# Patient Record
Sex: Male | Born: 1945 | Race: White | Hispanic: No | Marital: Married | Smoking: Never smoker
Health system: Southern US, Community
[De-identification: ages and names within clinical notes are randomized; demographics above are authoritative.]

## PROBLEM LIST (undated history)

## (undated) DIAGNOSIS — I469 Cardiac arrest, cause unspecified: Secondary | ICD-10-CM

## (undated) DIAGNOSIS — N400 Enlarged prostate without lower urinary tract symptoms: Secondary | ICD-10-CM

## (undated) DIAGNOSIS — N200 Calculus of kidney: Secondary | ICD-10-CM

## (undated) DIAGNOSIS — F329 Major depressive disorder, single episode, unspecified: Secondary | ICD-10-CM

## (undated) DIAGNOSIS — I619 Nontraumatic intracerebral hemorrhage, unspecified: Secondary | ICD-10-CM

## (undated) DIAGNOSIS — M170 Bilateral primary osteoarthritis of knee: Secondary | ICD-10-CM

## (undated) DIAGNOSIS — F32A Depression, unspecified: Secondary | ICD-10-CM

## (undated) DIAGNOSIS — I639 Cerebral infarction, unspecified: Secondary | ICD-10-CM

## (undated) HISTORY — DX: Cardiac arrest, cause unspecified: I46.9

## (undated) HISTORY — DX: Benign prostatic hyperplasia without lower urinary tract symptoms: N40.0

## (undated) HISTORY — DX: Nontraumatic intracerebral hemorrhage, unspecified: I61.9

## (undated) HISTORY — DX: Major depressive disorder, single episode, unspecified: F32.9

## (undated) HISTORY — DX: Bilateral primary osteoarthritis of knee: M17.0

## (undated) HISTORY — DX: Calculus of kidney: N20.0

## (undated) HISTORY — DX: Depression, unspecified: F32.A

## (undated) HISTORY — PX: CARDIAC CATHETERIZATION: SHX172

---

## 1999-06-18 DIAGNOSIS — I469 Cardiac arrest, cause unspecified: Secondary | ICD-10-CM

## 1999-06-18 HISTORY — DX: Cardiac arrest, cause unspecified: I46.9

## 2005-11-06 ENCOUNTER — Other Ambulatory Visit: Payer: Self-pay

## 2005-11-06 ENCOUNTER — Observation Stay: Payer: Self-pay | Admitting: Internal Medicine

## 2007-06-18 HISTORY — PX: ROTATOR CUFF REPAIR: SHX139

## 2007-06-23 ENCOUNTER — Inpatient Hospital Stay: Payer: Self-pay | Admitting: Internal Medicine

## 2007-06-23 ENCOUNTER — Other Ambulatory Visit: Payer: Self-pay

## 2007-08-10 ENCOUNTER — Ambulatory Visit: Payer: Self-pay | Admitting: Specialist

## 2007-08-17 ENCOUNTER — Ambulatory Visit: Payer: Self-pay | Admitting: Specialist

## 2007-08-28 ENCOUNTER — Ambulatory Visit: Payer: Self-pay | Admitting: Specialist

## 2008-03-23 ENCOUNTER — Ambulatory Visit: Payer: Self-pay | Admitting: Specialist

## 2008-03-30 ENCOUNTER — Ambulatory Visit: Payer: Self-pay | Admitting: Specialist

## 2008-04-28 ENCOUNTER — Ambulatory Visit: Payer: Self-pay | Admitting: Specialist

## 2009-04-01 ENCOUNTER — Emergency Department: Payer: Self-pay | Admitting: Emergency Medicine

## 2009-04-10 ENCOUNTER — Emergency Department: Payer: Self-pay | Admitting: Emergency Medicine

## 2010-06-17 HISTORY — PX: HERNIA REPAIR: SHX51

## 2010-12-25 ENCOUNTER — Ambulatory Visit: Payer: Self-pay | Admitting: General Surgery

## 2011-02-08 ENCOUNTER — Ambulatory Visit: Payer: Self-pay | Admitting: General Surgery

## 2011-07-09 HISTORY — PX: CARDIOVASCULAR STRESS TEST: SHX262

## 2011-07-19 LAB — HM COLONOSCOPY

## 2012-02-05 ENCOUNTER — Encounter: Payer: Self-pay | Admitting: Internal Medicine

## 2012-02-05 ENCOUNTER — Ambulatory Visit (INDEPENDENT_AMBULATORY_CARE_PROVIDER_SITE_OTHER): Payer: Medicare PPO | Admitting: Internal Medicine

## 2012-02-05 VITALS — BP 112/68 | HR 66 | Temp 98.4°F | Ht 69.0 in | Wt 201.0 lb

## 2012-02-05 DIAGNOSIS — Z8674 Personal history of sudden cardiac arrest: Secondary | ICD-10-CM

## 2012-02-05 DIAGNOSIS — M170 Bilateral primary osteoarthritis of knee: Secondary | ICD-10-CM

## 2012-02-05 DIAGNOSIS — F331 Major depressive disorder, recurrent, moderate: Secondary | ICD-10-CM | POA: Insufficient documentation

## 2012-02-05 DIAGNOSIS — Z125 Encounter for screening for malignant neoplasm of prostate: Secondary | ICD-10-CM

## 2012-02-05 DIAGNOSIS — N400 Enlarged prostate without lower urinary tract symptoms: Secondary | ICD-10-CM

## 2012-02-05 DIAGNOSIS — M1710 Unilateral primary osteoarthritis, unspecified knee: Secondary | ICD-10-CM

## 2012-02-05 DIAGNOSIS — F329 Major depressive disorder, single episode, unspecified: Secondary | ICD-10-CM

## 2012-02-05 DIAGNOSIS — Z79899 Other long term (current) drug therapy: Secondary | ICD-10-CM

## 2012-02-05 DIAGNOSIS — I469 Cardiac arrest, cause unspecified: Secondary | ICD-10-CM | POA: Insufficient documentation

## 2012-02-05 LAB — BASIC METABOLIC PANEL
CO2: 29 mEq/L (ref 19–32)
Calcium: 9.5 mg/dL (ref 8.4–10.5)
GFR: 78.91 mL/min (ref >60.00)
Sodium: 139 mEq/L (ref 135–145)

## 2012-02-05 LAB — LIPID PANEL
HDL: 49.1 mg/dL (ref >39.00)
Total CHOL/HDL Ratio: 4
VLDL: 19.6 mg/dL (ref 0.0–40.0)

## 2012-02-05 LAB — CBC WITH DIFFERENTIAL/PLATELET
Basophils Relative: 0.5 % (ref 0.0–3.0)
Eosinophils Relative: 2.1 % (ref 0.0–5.0)
Hemoglobin: 14.5 g/dL (ref 13.0–17.0)
Lymphocytes Relative: 20 % (ref 12.0–46.0)
Monocytes Relative: 10.3 % (ref 3.0–12.0)
Neutro Abs: 3 10*3/uL (ref 1.4–7.7)
RBC: 4.82 Mil/uL (ref 4.22–5.81)

## 2012-02-05 LAB — HEPATIC FUNCTION PANEL
ALT: 25 U/L (ref 0–53)
Alkaline Phosphatase: 63 U/L (ref 39–117)
Bilirubin, Direct: 0.2 mg/dL (ref 0.0–0.3)
Total Protein: 7 g/dL (ref 6.0–8.3)

## 2012-02-05 NOTE — Progress Notes (Signed)
Subjective:    Patient ID: Keith Hall, male    DOB: [birth year], 66 y.o.   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    Allergies  Allergen Reactions  . Penicillins Swelling    Past Medical History  Diagnosis Date  . Osteoarthritis of both knees   . Depression 1980's06/20/2004    MDD---even needed hospitalization  . Kidney stone 1980 only    Past Surgical History  Procedure Date  . Cardiovascular stress test 07/09/11    Negative--Dr Paraschos  . Cardiac catheterization last in 12-05-2007    insignificant coronary disease  . Hernia repair 12-05-10    left inguinal--Dr Byrnett  . Rotator cuff repair 05-Dec-2007    both done by Dr Reita Chard    Family History  Problem Relation Age of Onset  . Stroke Mother   . COPD Sister   . Heart disease Neg Hx   . Hypertension Neg Hx   . Diabetes Son     Type 1    History   Social History  . Marital Status: Married    Spouse Name: N/A    Number of Children: 1  . Years of Education: N/A   Occupational History  . Outside sales of furniture     Retired at 62   Social History Main Topics  . Smoking status: Never Smoker   . Smokeless tobacco: Never Used  . Alcohol Use: No  . Drug Use: No  . Sexually Active: Not on file   Other Topics Concern  . Not on file   Social History Narrative   1 sonHas living willWife, then son, have health care POAWould accept resuscitation attempts but no prolonged artificial ventilationWould not want tube feeds if  cognitively unaware   Review of Systems  Constitutional: Negative for fatigue.       Has lost weight with fitness and proper eating ([employer]). Down 20# Wears seat belt Not regular with exercise  HENT: Positive for hearing loss. Negative for dental problem and tinnitus.        Regular with dentist  Eyes: Negative for visual disturbance.       Early cataract on left  Respiratory: Negative for cough, chest tightness and shortness of breath.   Cardiovascular: Negative for chest pain, palpitations and leg swelling.  Gastrointestinal: Negative for nausea, vomiting, abdominal pain, constipation and blood in stool.       No heartburn  Genitourinary: Positive for urgency. Negative for difficulty urinating.       Occ urgency No sexual problems  Musculoskeletal: Positive for arthralgias. Negative for back pain.  Skin: Negative for rash.       No suspicious lesions  Neurological: Negative for dizziness, syncope, light-headedness and headaches.  Hematological: Negative for adenopathy. Does not bruise/bleed easily.  Psychiatric/Behavioral: Positive for disturbed wake/sleep cycle. Negative for dysphoric mood. The patient is not nervous/anxious.        Sleep is interrupted Some daytime somnolence--never when driving  Objective:   Physical Exam  Constitutional: He is oriented to person, place, and time. He appears well-developed and well-nourished. No distress.  HENT:  Mouth/Throat: Oropharynx is clear and moist. No oropharyngeal exudate.  Eyes: Conjunctivae and EOM are normal. Pupils are equal, round, and reactive to light.  Neck: Normal range of motion. Neck supple. No thyromegaly present.  Cardiovascular: Normal rate, regular rhythm and intact distal pulses.  Exam reveals no gallop.   Murmur heard.      Gr 3/6 blowing systolic murmur in tricuspid area  Pulmonary/Chest: Effort normal and breath sounds normal. No respiratory distress. He has no wheezes. He has no rales.    Abdominal: Soft. There is no tenderness.  Musculoskeletal: He exhibits no edema and no tenderness.  Lymphadenopathy:    He has no cervical adenopathy.  Neurological: He is alert and oriented to person, place, and time.  Skin: No rash noted. No erythema.  Psychiatric: He has a normal mood and affect. His behavior is normal. Thought content normal.          Assessment & Plan:

## 2012-02-05 NOTE — Patient Instructions (Signed)
Please check to see if you have had a pneumonia vaccine (pneumovax)

## 2012-02-05 NOTE — Assessment & Plan Note (Signed)
Mild symptoms No meds indicated now

## 2012-02-05 NOTE — Assessment & Plan Note (Signed)
From work stress No problems now

## 2012-02-05 NOTE — Assessment & Plan Note (Signed)
Really a mystery No sig CAD No recurrences Does have murmur suggestive of tricuspid regurg No dyspnea to suggest pulmonary hypertension Would check echo if any symptoms

## 2012-02-05 NOTE — Assessment & Plan Note (Signed)
Discussed quad strengthening No major effusion or joint abnormalities on exam Will continue ibuprofen and glucosamine

## 2012-02-10 ENCOUNTER — Encounter: Payer: Self-pay | Admitting: *Deleted

## 2012-02-16 DIAGNOSIS — 419620001 Death: Secondary | SNOMED CT

## 2012-02-16 DEATH — deceased

## 2012-06-18 ENCOUNTER — Encounter: Payer: Self-pay | Admitting: Family Medicine

## 2012-06-18 ENCOUNTER — Ambulatory Visit (INDEPENDENT_AMBULATORY_CARE_PROVIDER_SITE_OTHER): Payer: Medicare Other | Admitting: Family Medicine

## 2012-06-18 VITALS — BP 120/76 | HR 79 | Temp 98.2°F | Ht 69.0 in | Wt 198.8 lb

## 2012-06-18 DIAGNOSIS — J019 Acute sinusitis, unspecified: Secondary | ICD-10-CM | POA: Insufficient documentation

## 2012-06-18 MED ORDER — AZITHROMYCIN 250 MG PO TABS: ORAL_TABLET | ORAL | Status: DC

## 2012-06-18 NOTE — Progress Notes (Signed)
  Subjective:    Patient ID: Merlene Morse, male    DOB: 1945/08/16, 67 y.o.   MRN: [id number]  HPI Comments: Has history of pleurisy.   Sinusitis This is a new problem. The current episode started 1 to 4 weeks ago (12/26). The problem has been gradually worsening since onset. Maximum temperature: subjective. Associated symptoms include chills, congestion, coughing and sinus pressure. Pertinent negatives include no ear pain, shortness of breath, sore throat or swollen glands. (Left frontal sinus pain) Past treatments include acetaminophen (Nyquil, dayquil). The treatment provided mild relief.      Review of Systems  Constitutional: Positive for chills.  HENT: Positive for congestion and sinus pressure. Negative for ear pain and sore throat.   Respiratory: Positive for cough. Negative for shortness of breath.        Objective:   Physical Exam  Constitutional: Vital signs are normal. He appears well-developed and well-nourished.  Non-toxic appearance. He does not appear ill. No distress.  HENT:  Head: Normocephalic and atraumatic.  Right Ear: Hearing, tympanic membrane, external ear and ear canal normal. No tenderness. No foreign bodies. Tympanic membrane is not retracted and not bulging.  Left Ear: Hearing, tympanic membrane, external ear and ear canal normal. No tenderness. No foreign bodies. Tympanic membrane is not retracted and not bulging.  Nose: Nose normal. No mucosal edema or rhinorrhea. Right sinus exhibits no maxillary sinus tenderness and no frontal sinus tenderness. Left sinus exhibits no maxillary sinus tenderness and no frontal sinus tenderness.  Mouth/Throat: Uvula is midline, oropharynx is clear and moist and mucous membranes are normal. Normal dentition. No dental caries. No oropharyngeal exudate or tonsillar abscesses.  Eyes: Conjunctivae normal, EOM and lids are normal. Pupils are equal, round, and reactive to light. No foreign bodies found.  Neck: Trachea normal,  normal range of motion and phonation normal. Neck supple. Carotid bruit is not present. No mass and no thyromegaly present.  Cardiovascular: Normal rate, regular rhythm, S1 normal, S2 normal, normal heart sounds, intact distal pulses and normal pulses.  Exam reveals no gallop.   No murmur heard. Pulmonary/Chest: Effort normal and breath sounds normal. No respiratory distress. He has no wheezes. He has no rhonchi. He has no rales.  Abdominal: Soft. Normal appearance and bowel sounds are normal. There is no hepatosplenomegaly. There is no tenderness. There is no rebound, no guarding and no CVA tenderness. No hernia.  Neurological: He is alert. He has normal reflexes.  Skin: Skin is warm, dry and intact. No rash noted.  Psychiatric: He has a normal mood and affect. His speech is normal and behavior is normal. Judgment normal.          Assessment & Plan:

## 2012-06-18 NOTE — Patient Instructions (Addendum)
Start mucinex twice daily  and nasal saline spray 2-3 times a day. Complete antibiotics. Call if not improving as expected.

## 2012-06-18 NOTE — Assessment & Plan Note (Signed)
Treat with Z-pack. Nasal saline irrigation, mucinex. Call if not improving as tolerated.

## 2012-07-06 ENCOUNTER — Ambulatory Visit: Payer: Self-pay | Admitting: General Practice

## 2012-07-06 LAB — URINALYSIS, COMPLETE
Bacteria: NONE SEEN
Bilirubin,UR: NEGATIVE
Blood: NEGATIVE
Ketone: NEGATIVE
Leukocyte Esterase: NEGATIVE
Nitrite: NEGATIVE
Ph: 5 (ref 4.5–8.0)
Protein: NEGATIVE
Specific Gravity: 1.009 (ref 1.003–1.030)
WBC UR: NONE SEEN /HPF (ref 0–5)

## 2012-07-06 LAB — SEDIMENTATION RATE: Erythrocyte Sed Rate: 4 mm/hr (ref 0–20)

## 2012-07-06 LAB — CBC
HCT: 41.4 % (ref 40.0–52.0)
MCH: 30.7 pg (ref 26.0–34.0)
MCV: 88 fL (ref 80–100)
Platelet: 133 10*3/uL — ABNORMAL LOW (ref 150–440)
RBC: 4.72 10*6/uL (ref 4.40–5.90)
RDW: 12.6 % (ref 11.5–14.5)

## 2012-07-06 LAB — MRSA PCR SCREENING

## 2012-07-06 LAB — BASIC METABOLIC PANEL
Anion Gap: 5 — ABNORMAL LOW (ref 7–16)
BUN: 13 mg/dL (ref 7–18)
EGFR (African American): >60
Glucose: 91 mg/dL (ref 65–99)
Potassium: 4.2 mmol/L (ref 3.5–5.1)

## 2012-07-06 LAB — PROTIME-INR: Prothrombin Time: 12.6 secs (ref 11.5–14.7)

## 2012-07-18 HISTORY — PX: TOTAL KNEE ARTHROPLASTY: SHX125

## 2012-08-03 ENCOUNTER — Inpatient Hospital Stay: Payer: Self-pay | Admitting: General Practice

## 2012-08-04 LAB — BASIC METABOLIC PANEL
Anion Gap: 6 — ABNORMAL LOW (ref 7–16)
Calcium, Total: 7.3 mg/dL — ABNORMAL LOW (ref 8.5–10.1)
Chloride: 106 mmol/L (ref 98–107)
Co2: 25 mmol/L (ref 21–32)
EGFR (Non-African Amer.): >60
Glucose: 108 mg/dL — ABNORMAL HIGH (ref 65–99)
Potassium: 4 mmol/L (ref 3.5–5.1)
Sodium: 137 mmol/L (ref 136–145)

## 2012-08-04 LAB — PLATELET COUNT: Platelet: 80 10*3/uL — ABNORMAL LOW (ref 150–440)

## 2012-08-04 LAB — HEMOGLOBIN: HGB: 12.6 g/dL — ABNORMAL LOW (ref 13.0–18.0)

## 2012-08-05 LAB — BASIC METABOLIC PANEL
Anion Gap: 6 — ABNORMAL LOW (ref 7–16)
Calcium, Total: 7.4 mg/dL — ABNORMAL LOW (ref 8.5–10.1)
Creatinine: 0.77 mg/dL (ref 0.60–1.30)
EGFR (African American): >60
EGFR (Non-African Amer.): >60
Glucose: 84 mg/dL (ref 65–99)
Osmolality: 275 (ref 275–301)
Potassium: 4.3 mmol/L (ref 3.5–5.1)
Sodium: 139 mmol/L (ref 136–145)

## 2012-08-05 LAB — HEMOGLOBIN: HGB: 12.9 g/dL — ABNORMAL LOW (ref 13.0–18.0)

## 2012-08-05 LAB — PLATELET COUNT: Platelet: 71 10*3/uL — ABNORMAL LOW (ref 150–440)

## 2012-08-06 LAB — PLATELET COUNT: Platelet: 75 10*3/uL — ABNORMAL LOW (ref 150–440)

## 2013-02-05 ENCOUNTER — Ambulatory Visit (INDEPENDENT_AMBULATORY_CARE_PROVIDER_SITE_OTHER): Payer: Self-pay | Admitting: Internal Medicine

## 2013-02-05 ENCOUNTER — Encounter: Payer: Self-pay | Admitting: Internal Medicine

## 2013-02-05 VITALS — BP 130/70 | HR 70 | Temp 98.0°F | Ht 69.0 in | Wt 209.0 lb

## 2013-02-05 DIAGNOSIS — M170 Bilateral primary osteoarthritis of knee: Secondary | ICD-10-CM

## 2013-02-05 DIAGNOSIS — R011 Cardiac murmur, unspecified: Secondary | ICD-10-CM

## 2013-02-05 DIAGNOSIS — N400 Enlarged prostate without lower urinary tract symptoms: Secondary | ICD-10-CM

## 2013-02-05 DIAGNOSIS — Z23 Encounter for immunization: Secondary | ICD-10-CM

## 2013-02-05 DIAGNOSIS — M1710 Unilateral primary osteoarthritis, unspecified knee: Secondary | ICD-10-CM

## 2013-02-05 DIAGNOSIS — IMO0001 Reserved for inherently not codable concepts without codable children: Secondary | ICD-10-CM | POA: Insufficient documentation

## 2013-02-05 DIAGNOSIS — Z0000 Encounter for general adult medical examination without abnormal findings: Secondary | ICD-10-CM | POA: Insufficient documentation

## 2013-02-05 DIAGNOSIS — I469 Cardiac arrest, cause unspecified: Secondary | ICD-10-CM

## 2013-02-05 DIAGNOSIS — R7989 Other specified abnormal findings of blood chemistry: Secondary | ICD-10-CM

## 2013-02-05 LAB — BASIC METABOLIC PANEL
BUN: 15 mg/dL (ref 6–23)
Creatinine, Ser: 1.1 mg/dL (ref 0.4–1.5)
GFR: 72.77 mL/min (ref >60.00)
Potassium: 3.8 mEq/L (ref 3.5–5.1)

## 2013-02-05 LAB — CBC WITH DIFFERENTIAL/PLATELET
Basophils Relative: 0.5 % (ref 0.0–3.0)
Eosinophils Relative: 3.7 % (ref 0.0–5.0)
Monocytes Relative: 10 % (ref 3.0–12.0)
Neutrophils Relative %: 70.7 % (ref 43.0–77.0)
Platelets: 139 10*3/uL — ABNORMAL LOW (ref 150.0–400.0)
RBC: 4.66 Mil/uL (ref 4.22–5.81)
WBC: 4.8 10*3/uL (ref 4.5–10.5)

## 2013-02-05 LAB — LIPID PANEL
Cholesterol: 202 mg/dL — ABNORMAL HIGH (ref 0–200)
HDL: 48.3 mg/dL (ref >39.00)
Total CHOL/HDL Ratio: 4
VLDL: 26.4 mg/dL (ref 0.0–40.0)

## 2013-02-05 LAB — HEPATIC FUNCTION PANEL
AST: 23 U/L (ref 0–37)
Alkaline Phosphatase: 57 U/L (ref 39–117)
Bilirubin, Direct: 0.1 mg/dL (ref 0.0–0.3)
Total Bilirubin: 0.8 mg/dL (ref 0.3–1.2)

## 2013-02-05 LAB — TSH: TSH: 1.48 u[IU]/mL (ref 0.35–5.50)

## 2013-02-05 NOTE — Assessment & Plan Note (Signed)
Mild symptoms at most

## 2013-02-05 NOTE — Assessment & Plan Note (Signed)
Generally healthy Will give pneumovax Defer PSA---consider testing next year for last time

## 2013-02-05 NOTE — Assessment & Plan Note (Signed)
Stable prominent murmur in tricuspid region---could be MR also Will leave to Dr Darrold Junker to do echo to evaluate

## 2013-02-05 NOTE — Assessment & Plan Note (Signed)
Doing better after right TKR

## 2013-02-05 NOTE — Progress Notes (Signed)
Subjective:    Patient ID: Keith Hall, male    DOB: 04-30-1946, 67 y.o.   MRN: [id number]  HPI Here for physical  Had right TKR by Dr Ernest Pine in February Did very well with that  Still sees Dr Jason Nest him before surgery No further testing  Current Outpatient Prescriptions on File Prior to Visit  Medication Sig Dispense Refill  . aspirin 81 MG tablet Take 162 mg by mouth daily.      . Glucosamine-Chondroitin (MOVE FREE PO) Take 2 tablets by mouth daily.      . Ibuprofen (ADVIL) 200 MG CAPS Take 2 capsules by mouth 2 (two) times daily.      . Multiple Vitamins-Minerals (ONE-A-DAY 50 PLUS) TABS Take 2 tablets by mouth daily.       No current facility-administered medications on file prior to visit.    Allergies  Allergen Reactions  . Penicillins Swelling    Past Medical History  Diagnosis Date  . Osteoarthritis of both knees   . Depression 1980's06/03/04    MDD---even needed hospitalization  . Kidney stone 1980 only  . Sudden cardiac death Nov 18, 1999    no clear etiology of this  . BPH (benign prostatic hypertrophy)     Past Surgical History  Procedure Laterality Date  . Cardiovascular stress test  07/09/11    Negative--Dr Paraschos  . Cardiac catheterization  last in 11/18/2007    insignificant coronary disease  . Hernia repair  November 18, 2010    left inguinal--Dr Byrnett  . Rotator cuff repair  11/18/2007    both done by Dr Reita Chard  . Total knee arthroplasty Right 2/14    Dr Ernest Pine    Family History  Problem Relation Age of Onset  . Stroke Mother   . COPD Sister   . Heart disease Neg Hx   . Hypertension Neg Hx   . Diabetes Son     Type 1    History   Social History  . Marital Status: Married    Spouse Name: N/A    Number of Children: 1  . Years of Education: N/A   Occupational History  . Outside sales of furniture     Retired at 57   Social History Main Topics  . Smoking status: Never Smoker   . Smokeless tobacco: Never Used  . Alcohol Use: No  . Drug  Use: No  . Sexual Activity: Not on file   Other Topics Concern  . Not on file   Social History Narrative   1 son   Has living will   Wife, then son, have health care POA   Would accept resuscitation attempts but no prolonged artificial ventilation   Would not want tube feeds if cognitively unaware   Review of Systems  Constitutional: Negative for fatigue and unexpected weight change.       [place] exercise [place] [employer] Wears seat belt  HENT: Positive for hearing loss. Negative for congestion, rhinorrhea, dental problem and tinnitus.        Regular with dentist  Eyes: Positive for visual disturbance.       Early cataract on left  Respiratory: Negative for cough, chest tightness and shortness of breath.   Cardiovascular: Negative for chest pain, palpitations and leg swelling.  Gastrointestinal: Negative for nausea, vomiting, abdominal pain, constipation and blood in stool.       No heartburn  Endocrine: Negative for cold intolerance and heat intolerance.  Genitourinary: Positive for urgency. Negative for frequency  and difficulty urinating.       Rare urgency  No sexual lesions  Musculoskeletal: Positive for arthralgias. Negative for back pain and joint swelling.  Skin: Negative for rash.       Plans for derm check up in October  Allergic/Immunologic: Negative for environmental allergies and immunocompromised state.  Neurological: Negative for dizziness, syncope, weakness, light-headedness, numbness and headaches.  Hematological: Negative for adenopathy. Does not bruise/bleed easily.  Psychiatric/Behavioral: Negative for sleep disturbance and dysphoric mood. The patient is not nervous/anxious.        Generally sleeps well       Objective:   Physical Exam  Constitutional: He is oriented to person, place, and time. He appears well-developed and well-nourished. No distress.  HENT:  Head: Normocephalic and atraumatic.  Right Ear: External ear normal.  Left Ear: External ear normal.   Mouth/Throat: Oropharynx is clear and moist. No oropharyngeal exudate.  Eyes: Conjunctivae and EOM are normal. Pupils are equal, round, and reactive to light.  Neck: Normal range of motion. Neck supple. No thyromegaly present.  Cardiovascular: Normal rate, regular rhythm and intact distal pulses.  Exam reveals no gallop.   Murmur heard. Gr 3/6 blowing systolic murmur at left sternal border, softer in mitral area  Pulmonary/Chest: Effort normal and breath sounds normal. No respiratory distress. He has no wheezes. He has no rales.  Abdominal: Soft. There is no tenderness.  Musculoskeletal: He exhibits no edema and no tenderness.  Lymphadenopathy:    He has no cervical adenopathy.  Neurological: He is alert and oriented to person, place, and time.  Skin: No rash noted. No erythema.  Psychiatric: He has a normal mood and affect. His behavior is normal.          Assessment & Plan:

## 2013-02-05 NOTE — Addendum Note (Signed)
Addended by: Sueanne Margarita on: 01/27/2013 01:02 PM   Modules accepted: Orders

## 2013-02-08 LAB — LDL CHOLESTEROL, DIRECT: Direct LDL: 138.2 mg/dL

## 2013-02-15 DIAGNOSIS — 419620001 Death: Secondary | SNOMED CT

## 2013-02-15 DEATH — deceased

## 2013-02-17 ENCOUNTER — Encounter: Payer: Self-pay | Admitting: Internal Medicine

## 2013-02-23 ENCOUNTER — Other Ambulatory Visit (INDEPENDENT_AMBULATORY_CARE_PROVIDER_SITE_OTHER): Payer: Medicare Other

## 2013-02-23 DIAGNOSIS — R7301 Impaired fasting glucose: Secondary | ICD-10-CM

## 2013-02-23 LAB — GLUCOSE, RANDOM: Glucose, Bld: 93 mg/dL (ref 70–99)

## 2013-02-23 LAB — HEMOGLOBIN A1C: Hgb A1c MFr Bld: 5.1 % (ref 4.6–6.5)

## 2013-04-22 ENCOUNTER — Other Ambulatory Visit: Payer: Self-pay

## 2013-04-29 ENCOUNTER — Ambulatory Visit: Payer: Self-pay | Admitting: General Practice

## 2013-04-29 LAB — URINALYSIS, COMPLETE
Bacteria: NONE SEEN
Bilirubin,UR: NEGATIVE
Nitrite: NEGATIVE
Ph: 6 (ref 4.5–8.0)
Protein: NEGATIVE
Specific Gravity: 1.013 (ref 1.003–1.030)
Squamous Epithelial: NONE SEEN
WBC UR: NONE SEEN /HPF (ref 0–5)

## 2013-04-29 LAB — SEDIMENTATION RATE: Erythrocyte Sed Rate: 3 mm/hr (ref 0–20)

## 2013-04-29 LAB — BASIC METABOLIC PANEL
Calcium, Total: 9 mg/dL (ref 8.5–10.1)
Chloride: 107 mmol/L (ref 98–107)
Creatinine: 0.87 mg/dL (ref 0.60–1.30)
EGFR (African American): >60
EGFR (Non-African Amer.): >60
Osmolality: 274 (ref 275–301)
Sodium: 137 mmol/L (ref 136–145)

## 2013-04-29 LAB — CBC
HCT: 42.9 % (ref 40.0–52.0)
HGB: 15 g/dL (ref 13.0–18.0)
MCH: 30.4 pg (ref 26.0–34.0)
MCV: 87 fL (ref 80–100)
RBC: 4.95 10*6/uL (ref 4.40–5.90)

## 2013-04-29 LAB — MRSA PCR SCREENING

## 2013-04-29 LAB — PROTIME-INR: Prothrombin Time: 12.9 secs (ref 11.5–14.7)

## 2013-04-30 LAB — URINE CULTURE

## 2013-05-05 ENCOUNTER — Encounter: Payer: Self-pay | Admitting: Internal Medicine

## 2013-05-05 ENCOUNTER — Ambulatory Visit (INDEPENDENT_AMBULATORY_CARE_PROVIDER_SITE_OTHER): Payer: Medicare Other | Admitting: Internal Medicine

## 2013-05-05 VITALS — BP 120/70 | HR 85 | Temp 98.0°F | Wt 208.0 lb

## 2013-05-05 DIAGNOSIS — J019 Acute sinusitis, unspecified: Secondary | ICD-10-CM | POA: Insufficient documentation

## 2013-05-05 MED ORDER — AZITHROMYCIN 250 MG PO TABS: ORAL_TABLET | ORAL | Status: DC

## 2013-05-05 NOTE — Progress Notes (Signed)
Subjective:    Patient ID: Keith Hall, male    DOB: 1946-04-16, 67 y.o.   MRN: [id number]  HPI Has a lot of terrible congestion Can't sleep lying down--- needs to sit up in recliner Feels that he has pleurisy-- "build up of lots of mucus" (not pleurisy) So much mucus--he has to vomit it up Pain over left eye---bad frontal pressure No sore throat No ear pain Cough--day and night Started a week ago and has built up No fever but slight chills. No sweats  Has knee surgery scheduled next week--TKR by Dr Rae Halsted mucinex--not much help, and robitussin--this helped cough  Current Outpatient Prescriptions on File Prior to Visit  Medication Sig Dispense Refill  . aspirin 81 MG tablet Take 162 mg by mouth daily.      . Glucosamine-Chondroitin (MOVE FREE PO) Take 2 tablets by mouth daily.      . Ibuprofen (ADVIL) 200 MG CAPS Take 2 capsules by mouth 2 (two) times daily.      . Multiple Vitamins-Minerals (ONE-A-DAY 50 PLUS) TABS Take 2 tablets by mouth daily.       No current facility-administered medications on file prior to visit.    Allergies  Allergen Reactions  . Penicillins Swelling    Past Medical History  Diagnosis Date  . Osteoarthritis of both knees   . Depression 1980's27-May-2004    MDD---even needed hospitalization  . Kidney stone 1980 only  . Sudden cardiac death 1999/11/11    no clear etiology of this  . BPH (benign prostatic hypertrophy)     Past Surgical History  Procedure Laterality Date  . Cardiovascular stress test  07/09/11    Negative--Dr Paraschos  . Cardiac catheterization  last in 2007-11-11    insignificant coronary disease  . Hernia repair  11/11/10    left inguinal--Dr Byrnett  . Rotator cuff repair  2007/11/11    both done by Dr Reita Chard  . Total knee arthroplasty Right 2/14    Dr Ernest Pine    Family History  Problem Relation Age of Onset  . Stroke Mother   . COPD Sister   . Heart disease Neg Hx   . Hypertension Neg Hx   . Diabetes Son     Type 1     History   Social History  . Marital Status: Married    Spouse Name: N/A    Number of Children: 1  . Years of Education: N/A   Occupational History  . Outside sales of furniture     Retired at 16   Social History Main Topics  . Smoking status: Never Smoker   . Smokeless tobacco: Never Used  . Alcohol Use: No  . Drug Use: No  . Sexual Activity: Not on file   Other Topics Concern  . Not on file   Social History Narrative   1 son   Has living will   Wife, then son, have health care POA   Would accept resuscitation attempts but no prolonged artificial ventilation   Would not want tube feeds if cognitively unaware   Review of Systems Started after doing yard work-- though he did wear a mask    Objective:   Physical Exam  Constitutional: He appears well-developed and well-nourished. No distress.  HENT:  Mouth/Throat: Oropharynx is clear and moist. No oropharyngeal exudate.  No sinus tenderness Moderate nasal congestion TMs negative   Neck: Normal range of motion. Neck supple. No thyromegaly present.  Pulmonary/Chest: Effort normal and  breath sounds normal. No respiratory distress. He has no wheezes. He has no rales.  Lymphadenopathy:    He has no cervical adenopathy.          Assessment & Plan:

## 2013-05-05 NOTE — Assessment & Plan Note (Signed)
Clearly seems to have secondary bacterial infection Discussed symptomatic Rx Will treat with z-pak due to pcn allergy Hopefully will still be able to have TKR on Monday

## 2013-05-05 NOTE — Progress Notes (Signed)
Pre-visit discussion using our clinic review tool. No additional management support is needed unless otherwise documented below in the visit note.  

## 2013-05-06 ENCOUNTER — Ambulatory Visit: Payer: Medicare Other | Admitting: Internal Medicine

## 2013-05-11 ENCOUNTER — Encounter: Payer: Self-pay | Admitting: Internal Medicine

## 2013-05-11 ENCOUNTER — Telehealth: Payer: Self-pay

## 2013-05-11 ENCOUNTER — Ambulatory Visit (INDEPENDENT_AMBULATORY_CARE_PROVIDER_SITE_OTHER): Payer: Medicare Other | Admitting: Internal Medicine

## 2013-05-11 VITALS — BP 128/70 | HR 73 | Temp 98.5°F | Resp 14 | Wt 205.0 lb

## 2013-05-11 DIAGNOSIS — J019 Acute sinusitis, unspecified: Secondary | ICD-10-CM

## 2013-05-11 MED ORDER — LEVOFLOXACIN 500 MG PO TABS: 500.0000 mg | ORAL_TABLET | Freq: Every day | ORAL | Status: DC

## 2013-05-11 NOTE — Progress Notes (Signed)
  Subjective:    Patient ID: Keith Hall, male    DOB: 1946/01/22, 67 y.o.   MRN: [id number]  HPI Here with wife Is some better but not over this Still with post nasal drip  No fever Has had some sweats but no chills or shakes Slight shortness of breath--mostly with the cough  Done with the z-pak  Current Outpatient Prescriptions on File Prior to Visit  Medication Sig Dispense Refill  . aspirin 81 MG tablet Take 162 mg by mouth daily.      . Glucosamine-Chondroitin (MOVE FREE PO) Take 2 tablets by mouth daily.      . Ibuprofen (ADVIL) 200 MG CAPS Take 2 capsules by mouth 2 (two) times daily.      . Multiple Vitamins-Minerals (ONE-A-DAY 50 PLUS) TABS Take 2 tablets by mouth daily.       No current facility-administered medications on file prior to visit.    Allergies  Allergen Reactions  . Penicillins Swelling    Past Medical History  Diagnosis Date  . Osteoarthritis of both knees   . Depression 1980'sJun 03, 2004    MDD---even needed hospitalization  . Kidney stone 1980 only  . Sudden cardiac death 11-18-1999    no clear etiology of this  . BPH (benign prostatic hypertrophy)     Past Surgical History  Procedure Laterality Date  . Cardiovascular stress test  07/09/11    Negative--Dr Paraschos  . Cardiac catheterization  last in 11/18/2007    insignificant coronary disease  . Hernia repair  Nov 18, 2010    left inguinal--Dr Byrnett  . Rotator cuff repair  11-18-07    both done by Dr Reita Chard  . Total knee arthroplasty Right 2/14    Dr Ernest Pine    Family History  Problem Relation Age of Onset  . Stroke Mother   . COPD Sister   . Heart disease Neg Hx   . Hypertension Neg Hx   . Diabetes Son     Type 1    History   Social History  . Marital Status: Married    Spouse Name: N/A    Number of Children: 1  . Years of Education: N/A   Occupational History  . Outside sales of furniture     Retired at 10   Social History Main Topics  . Smoking status: Never Smoker   . Smokeless  tobacco: Never Used  . Alcohol Use: No  . Drug Use: No  . Sexual Activity: Not on file   Other Topics Concern  . Not on file   Social History Narrative   1 son   Has living will   Wife, then son, have health care POA   Would accept resuscitation attempts but no prolonged artificial ventilation   Would not want tube feeds if cognitively unaware   Review of Systems No vomiting or diarrhea Appetite is okay    Objective:   Physical Exam  Constitutional: He appears well-developed and well-nourished. No distress.  HENT:  Mouth/Throat: Oropharynx is clear and moist. No oropharyngeal exudate.  No sinus tenderness  Mild sinus inflammation  Neck: Normal range of motion. Neck supple. No thyromegaly present.  Pulmonary/Chest: Effort normal and breath sounds normal. No respiratory distress. He has no wheezes. He has no rales.  Lymphadenopathy:    He has no cervical adenopathy.          Assessment & Plan:

## 2013-05-11 NOTE — Progress Notes (Signed)
Pre-visit discussion using our clinic review tool. No additional management support is needed unless otherwise documented below in the visit note.  

## 2013-05-11 NOTE — Assessment & Plan Note (Signed)
Better but ongoing symptoms No signs of lower respiratory involvement--still prudent to hold off on the surgery Will change to levaquin Hopefully can reschedule TKR soon

## 2013-05-11 NOTE — Telephone Encounter (Signed)
Pt request status of levaquin rx. Spoke with Lafonda Mosses at [facility] ready for pick up ; pt voiced understanding.

## 2013-05-21 ENCOUNTER — Ambulatory Visit: Payer: Medicare Other | Admitting: Internal Medicine

## 2013-06-11 ENCOUNTER — Telehealth: Payer: Self-pay

## 2013-06-11 MED ORDER — LEVOFLOXACIN 500 MG PO TABS: 500.0000 mg | ORAL_TABLET | Freq: Every day | ORAL | Status: DC

## 2013-06-11 NOTE — Telephone Encounter (Signed)
Mrs Kiehn said pt has head congestion,,non prod cough,and h/a. No fever, CP, SOB or dizziness. Similar symptoms when seen 04/2013 and pt completely was relieved from symptoms after taking Levaquin. Pt has been exposed to a lot of sick people over the holidays; pt is presently in [place] returning home late tonight and request refill of Levaquin to [facility].Please advise. Pt has rescheduled TKR the first of Jan.

## 2013-06-11 NOTE — Telephone Encounter (Signed)
Okay to renew levofloxacin 500mg  #10 x 0 1 daily  If he has fever or SOB, he needs to be seen I am mostly doing this to hopefully make sure he can get the surgery as planned (otherwise, I would require a visit)

## 2013-06-11 NOTE — Telephone Encounter (Signed)
rx sent to pharmacy by e-script Spoke with patient and advised results   

## 2013-06-17 HISTORY — PX: TOTAL KNEE ARTHROPLASTY: SHX125

## 2013-06-29 ENCOUNTER — Ambulatory Visit: Payer: Self-pay | Admitting: Anesthesiology

## 2013-06-29 LAB — CBC
HCT: 42.6 % (ref 40.0–52.0)
HGB: 14.9 g/dL (ref 13.0–18.0)
MCH: 30 pg (ref 26.0–34.0)
MCHC: 34.9 g/dL (ref 32.0–36.0)
MCV: 86 fL (ref 80–100)
PLATELETS: 147 10*3/uL — AB (ref 150–440)
RBC: 4.95 10*6/uL (ref 4.40–5.90)
RDW: 12.5 % (ref 11.5–14.5)
WBC: 5.4 10*3/uL (ref 3.8–10.6)

## 2013-07-05 ENCOUNTER — Inpatient Hospital Stay: Payer: Self-pay | Admitting: General Practice

## 2013-07-06 LAB — PLATELET COUNT: PLATELETS: 82 10*3/uL — AB (ref 150–440)

## 2013-07-06 LAB — BASIC METABOLIC PANEL
ANION GAP: 4 — AB (ref 7–16)
BUN: 14 mg/dL (ref 7–18)
Calcium, Total: 8.1 mg/dL — ABNORMAL LOW (ref 8.5–10.1)
Chloride: 103 mmol/L (ref 98–107)
Co2: 25 mmol/L (ref 21–32)
Creatinine: 0.9 mg/dL (ref 0.60–1.30)
EGFR (African American): >60
EGFR (Non-African Amer.): >60
Glucose: 87 mg/dL (ref 65–99)
OSMOLALITY: 264 (ref 275–301)
Potassium: 3.8 mmol/L (ref 3.5–5.1)
SODIUM: 132 mmol/L — AB (ref 136–145)

## 2013-07-06 LAB — HEMOGLOBIN: HGB: 12.2 g/dL — AB (ref 13.0–18.0)

## 2013-07-07 LAB — BASIC METABOLIC PANEL
Anion Gap: 8 (ref 7–16)
BUN: 10 mg/dL (ref 7–18)
CALCIUM: 7.8 mg/dL — AB (ref 8.5–10.1)
CO2: 24 mmol/L (ref 21–32)
Chloride: 104 mmol/L (ref 98–107)
Creatinine: 0.85 mg/dL (ref 0.60–1.30)
EGFR (Non-African Amer.): >60
Glucose: 100 mg/dL — ABNORMAL HIGH (ref 65–99)
Osmolality: 271 (ref 275–301)
Potassium: 3.8 mmol/L (ref 3.5–5.1)
Sodium: 136 mmol/L (ref 136–145)

## 2013-07-07 LAB — PLATELET COUNT: PLATELETS: 74 10*3/uL — AB (ref 150–440)

## 2013-07-07 LAB — HEMOGLOBIN: HGB: 12.5 g/dL — ABNORMAL LOW (ref 13.0–18.0)

## 2013-07-19 ENCOUNTER — Encounter: Payer: Self-pay | Admitting: Internal Medicine

## 2013-07-20 ENCOUNTER — Ambulatory Visit (INDEPENDENT_AMBULATORY_CARE_PROVIDER_SITE_OTHER): Payer: Medicare Other | Admitting: Family Medicine

## 2013-07-20 ENCOUNTER — Encounter: Payer: Self-pay | Admitting: Family Medicine

## 2013-07-20 VITALS — BP 122/76 | HR 86 | Temp 98.1°F | Wt 203.8 lb

## 2013-07-20 DIAGNOSIS — L03116 Cellulitis of left lower limb: Secondary | ICD-10-CM | POA: Insufficient documentation

## 2013-07-20 DIAGNOSIS — L02419 Cutaneous abscess of limb, unspecified: Secondary | ICD-10-CM

## 2013-07-20 DIAGNOSIS — R5381 Other malaise: Secondary | ICD-10-CM | POA: Insufficient documentation

## 2013-07-20 DIAGNOSIS — L03119 Cellulitis of unspecified part of limb: Secondary | ICD-10-CM

## 2013-07-20 DIAGNOSIS — R5383 Other fatigue: Principal | ICD-10-CM

## 2013-07-20 LAB — CBC WITH DIFFERENTIAL/PLATELET
BASOS ABS: 0 10*3/uL (ref 0.0–0.1)
Basophils Relative: 0.4 % (ref 0.0–3.0)
Eosinophils Absolute: 0.1 10*3/uL (ref 0.0–0.7)
Eosinophils Relative: 1.9 % (ref 0.0–5.0)
HCT: 38.7 % — ABNORMAL LOW (ref 39.0–52.0)
Hemoglobin: 12.7 g/dL — ABNORMAL LOW (ref 13.0–17.0)
Lymphs Abs: 0.5 10*3/uL — ABNORMAL LOW (ref 0.7–4.0)
MCHC: 33 g/dL (ref 30.0–36.0)
MCV: 91 fl (ref 78.0–100.0)
MONOS PCT: 7.7 % (ref 3.0–12.0)
Monocytes Absolute: 0.5 10*3/uL (ref 0.1–1.0)
Neutro Abs: 5.5 10*3/uL (ref 1.4–7.7)
Neutrophils Relative %: 82.5 % — ABNORMAL HIGH (ref 43.0–77.0)
PLATELETS: 311 10*3/uL (ref 150.0–400.0)
RBC: 4.25 Mil/uL (ref 4.22–5.81)
RDW: 13.4 % (ref 11.5–14.6)
WBC: 6.7 10*3/uL (ref 4.5–10.5)

## 2013-07-20 LAB — POCT URINALYSIS DIPSTICK
Bilirubin, UA: NEGATIVE
Glucose, UA: NEGATIVE
Ketones, UA: NEGATIVE
Leukocytes, UA: NEGATIVE
Nitrite, UA: NEGATIVE
Protein, UA: NEGATIVE
RBC UA: NEGATIVE
UROBILINOGEN UA: 0.2
pH, UA: 6

## 2013-07-20 LAB — BASIC METABOLIC PANEL
BUN: 19 mg/dL (ref 6–23)
CALCIUM: 9.3 mg/dL (ref 8.4–10.5)
CO2: 22 mEq/L (ref 19–32)
Chloride: 105 mEq/L (ref 96–112)
Creatinine, Ser: 1.1 mg/dL (ref 0.4–1.5)
GFR: 72.67 mL/min (ref >60.00)
GLUCOSE: 104 mg/dL — AB (ref 70–99)
Potassium: 4.3 mEq/L (ref 3.5–5.1)
Sodium: 135 mEq/L (ref 135–145)

## 2013-07-20 NOTE — Progress Notes (Signed)
Subjective:    Patient ID: Keith Hall, male    DOB: 03-14-1946, 68 y.o.   MRN: [id number]  HPI CC: ?UTI  Presents with relative June today.  68 yo patient of Dr. Alla German with h/o BPH and remote kidney stone x1 presents today endorsing cold feeling and tender testicles for last 3-4 days.  gen malaise with nausea.  He had foley catheter removed 2 weeks ago.  Denies fevers/chills, back or bottom pain.  Denies h/o prostatitis.  No new rashes.  No abd pain, vomiting, diarrhea.  No recent dysuria, urgency.  + frequency attributes to increased water intake.  Has lost appetite.  He brings records from [facility] after recent discharge for L TKR.  Latest Na 132, Ca 8.1, Hgb 12.2 o/w WNL.  Cr normal (1/20).  Saw ortho this morning, stitches removed, as well as had first PT session today.    He has been taking several new meds and overall doesn't feel well.  Increased nausea with recent meds. He is currently on lovenox and off aspirin for DVT ppx after knee replacement. He is also on hydrocodone and tramadol for pain relief. Last week started on celebrex 200mg  bid and bactrim DS bid for cellulitis of L leg.  This was when he started feeling ill.  Has taken advil well in past. States he had MI 15 yrs ago, but since has had normal catheterization 2007/09/21).  Prior only took MVI, aspirin, and glucosamine.  Past Medical History  Diagnosis Date  . Osteoarthritis of both knees   . Depression 1980's06-Apr-2004    MDD---even needed hospitalization  . Kidney stone 1980 only  . Sudden cardiac death 1999-09-21    no clear etiology of this  . BPH (benign prostatic hypertrophy)     Past Surgical History  Procedure Laterality Date  . Cardiovascular stress test  07/09/11    Negative--Dr Paraschos  . Cardiac catheterization  last in 09/21/2007    insignificant coronary disease  . Hernia repair  Sep 21, 2010    left inguinal--Dr Byrnett  . Rotator cuff repair  09-21-07    both done by Dr Margaretmary Eddy  . Total knee arthroplasty Right  2/14    Dr Marry Guan    Review of Systems Per HPI    Objective:   Physical Exam  Nursing note and vitals reviewed. Constitutional: He appears well-developed and well-nourished. No distress.  HENT:  Mouth/Throat: Oropharynx is clear and moist. No oropharyngeal exudate.  Eyes: Conjunctivae and EOM are normal. Pupils are equal, round, and reactive to light. No scleral icterus.  Cardiovascular: Normal rate, regular rhythm, normal heart sounds and intact distal pulses.   No murmur heard. Pulmonary/Chest: Effort normal and breath sounds normal. No respiratory distress. He has no wheezes. He has no rales.  Abdominal: Soft. Bowel sounds are normal. He exhibits no distension and no mass. There is no tenderness. There is no rebound and no guarding. Hernia confirmed negative in the right inguinal area and confirmed negative in the left inguinal area.  Genitourinary: Testes normal and penis normal. Right testis shows no mass, no swelling and no tenderness. Right testis is descended. Left testis shows no mass, no swelling and no tenderness. Left testis is descended. No discharge found.  No temp change noted, scrotum /testicles not abnormally cold 2+ fem pulses  Musculoskeletal: He exhibits no edema.  bilat stockings in place.  L knee midline incision with steri strips, with edema and surrounding erythema delineated by ortho without spread past line  Lymphadenopathy:  Right: No inguinal adenopathy present.       Left: No inguinal adenopathy present.  Skin: Skin is warm and dry. No rash noted.       Assessment & Plan:

## 2013-07-20 NOTE — Assessment & Plan Note (Signed)
No evidence of UTI.  Testicular/scrotal exam WNL today. Anticipate malaise from current med regimen as he seems to have worsened since starting bactrim/celebrex (both new medicines for him), as well as possibly cellulitis related  Will check BUN/Cr to r/o ARF from celebrex use, will check CBC as well. Recommended d/c celebrex and change to advil (which he has tolerated well in past).  If no improvement, may need to consider switching antibiotic. Declines nausea med today. Pt will call me with update in 2 day, knows to notify me sooner if feeling worse.

## 2013-07-20 NOTE — Progress Notes (Signed)
Pre-visit discussion using our clinic review tool. No additional management support is needed unless otherwise documented below in the visit note.

## 2013-07-20 NOTE — Patient Instructions (Signed)
Exam ok today - and urine is looking ok as well. Bactrim can cause nausea.  celebrex can also cause nausea. Let's chck blood work today.  We will call you with results. For now, let's stop celebrex for next few days - may use advil as needed. If persistently feeling ill, let me know - we may need to choose different antibiotic.

## 2013-07-20 NOTE — Assessment & Plan Note (Signed)
Under treatment by ortho for this with bactrim DS BID.  Knows to return to ortho immediately if spread of erythema past delineated area

## 2013-07-22 ENCOUNTER — Telehealth: Payer: Self-pay

## 2013-07-22 MED ORDER — PROMETHAZINE HCL 25 MG PO TABS: 25.0000 mg | ORAL_TABLET | Freq: Three times a day (TID) | ORAL | Status: DC | PRN

## 2013-07-22 NOTE — Telephone Encounter (Signed)
Patient's wife notified and will call tomorrow if no better.

## 2013-07-22 NOTE — Telephone Encounter (Signed)
plz notify phenergan sent in.  Recommend re eval tomorrow if not improved overnight.

## 2013-07-22 NOTE — Telephone Encounter (Signed)
Mrs Rainville left v/m pt was seen 07/20/13; Mrs Dogan left v/m that she had responded via my chart early this morning and has not received response.  Pt is very nauseated and has started diarrhea. Mrs Essman request med(Phenergan) for nausea sent to  [facility] . The surgeon told pt to stop antiinflammatory and antibiotic. Pt is not able to eat due to nausea. Mrs Bunton request cb before end of day.

## 2013-08-02 ENCOUNTER — Other Ambulatory Visit: Payer: Self-pay

## 2013-08-02 NOTE — Telephone Encounter (Signed)
Mrs Basey left v/m requesting refill promethazine for nausea and surgeon had written zolpidem to help pt sleep; pt is out of zolpidem and surgeon's office advised pt to ck with PCP for further refills of zolpidem. [facility].Please advise.

## 2013-08-04 MED ORDER — PROMETHAZINE HCL 25 MG PO TABS: 25.0000 mg | ORAL_TABLET | Freq: Three times a day (TID) | ORAL | Status: DC | PRN

## 2013-08-04 NOTE — Telephone Encounter (Signed)
rx sent to pharmacy by e-script Spoke with patient and advised results   

## 2013-08-04 NOTE — Telephone Encounter (Signed)
Okay #20 x 0 of the promethazine  I seriously do not recommend the zolpidem. It is very hard to stop it once people start it and then it can be addicting and cause side effects. I will not refill it If he has ongoing sleep problems, we will need to discuss our options. It would be safe for him to try over the counter melatonin 3-6mg  at bedtime Most people will have trouble sleeping for a while after stopping zolpidem---but then it will usually settle down (he has told me in the past that he sleeps pretty well)

## 2014-01-31 ENCOUNTER — Other Ambulatory Visit: Payer: Medicare Other

## 2014-02-07 ENCOUNTER — Encounter: Payer: Self-pay | Admitting: Internal Medicine

## 2014-02-07 ENCOUNTER — Ambulatory Visit (INDEPENDENT_AMBULATORY_CARE_PROVIDER_SITE_OTHER): Payer: Medicare Other | Admitting: Internal Medicine

## 2014-02-07 VITALS — BP 120/80 | HR 68 | Temp 97.5°F | Ht 69.0 in | Wt 204.0 lb

## 2014-02-07 DIAGNOSIS — F3340 Major depressive disorder, recurrent, in remission, unspecified: Secondary | ICD-10-CM

## 2014-02-07 DIAGNOSIS — R011 Cardiac murmur, unspecified: Secondary | ICD-10-CM

## 2014-02-07 DIAGNOSIS — Z7189 Other specified counseling: Secondary | ICD-10-CM

## 2014-02-07 DIAGNOSIS — N400 Enlarged prostate without lower urinary tract symptoms: Secondary | ICD-10-CM

## 2014-02-07 DIAGNOSIS — IMO0002 Reserved for concepts with insufficient information to code with codable children: Secondary | ICD-10-CM

## 2014-02-07 DIAGNOSIS — Z0000 Encounter for general adult medical examination without abnormal findings: Secondary | ICD-10-CM

## 2014-02-07 DIAGNOSIS — Z23 Encounter for immunization: Secondary | ICD-10-CM

## 2014-02-07 NOTE — Progress Notes (Signed)
Pre visit review using our clinic review tool, if applicable. No additional management support is needed unless otherwise documented below in the visit note. 

## 2014-02-07 NOTE — Addendum Note (Signed)
Addended by: Despina Hidden on: 02/07/2014 05:01 PM   Modules accepted: Orders

## 2014-02-07 NOTE — Assessment & Plan Note (Signed)
Not noted by Dr Saralyn Pilar Has had echo in past--though I don't have report Sounds like mild tricuspid regurg--no action needed

## 2014-02-07 NOTE — Assessment & Plan Note (Signed)
2 bad spells but no recent mood problems No meds

## 2014-02-07 NOTE — Assessment & Plan Note (Signed)
See social history 

## 2014-02-07 NOTE — Assessment & Plan Note (Signed)
Mild symptoms No meds for now 

## 2014-02-07 NOTE — Assessment & Plan Note (Signed)
Generally healthy Doing well after TKR Will give flu shot and prevnar He requests PSA--I recommend this be the last. Will discuss further in the future UTD on colonoscopy

## 2014-02-07 NOTE — Progress Notes (Signed)
Subjective:    Patient ID: Keith Hall, male    DOB: [birth year], 68 y.o.   MRN: [id number]  HPI Here for physical Has done well since left TKR in January [facility] to [facility] Reviewed his wellness form and advanced directives No falls Worries about his wife but no depression or anhedonia Independent with instrumental ADLs Will need right cataract removed sometime soon  No heart problems Sees Dr Lorinda Creed once a year  Current Outpatient Prescriptions on File Prior to Visit  Medication Sig Dispense Refill  . aspirin 81 MG tablet Take 162 mg by mouth daily.      . Glucosamine-Chondroitin (MOVE FREE PO) Take 2 tablets by mouth daily.      . Ibuprofen (ADVIL) 200 MG CAPS Take 2 capsules by mouth 2 (two) times daily.      . Multiple Vitamins-Minerals (ONE-A-DAY 50 PLUS) TABS Take 2 tablets by mouth daily.       No current facility-administered medications on file prior to visit.    Allergies  Allergen Reactions  . Penicillins Swelling    Past Medical History  Diagnosis Date  . Osteoarthritis of both knees   . Depression 1980's2004/04/16    MDD---even needed hospitalization  . Kidney stone 1980 only  . Sudden cardiac death October 01, 1999    no clear etiology of this  . BPH (benign prostatic hypertrophy)     Past Surgical History  Procedure Laterality Date  . Cardiovascular stress test  07/09/11    Negative--Dr Paraschos  . Cardiac catheterization  last in 10/01/2007    insignificant coronary disease  . Hernia repair  10/01/10    left inguinal--Dr Byrnett  . Rotator cuff repair  10/01/2007    both done by Dr Margaretmary Eddy  . 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 Total knee arthroplasty Left 1/15    Dr [facility]    Family History  Problem Relation Age of Onset  . Stroke Mother   . COPD Sister   . Heart disease Neg Hx   . Hypertension Neg Hx   . Diabetes Son     Type 1    History   Social History  . Marital Status: Married    Spouse Name: N/A    Number of  Children: 1  . Years of Education: N/A   Occupational History  . Outside sales of furniture     Retired at [employer]  . Smoking status: Never Smoker   . Smokeless tobacco: Never Used  . Alcohol Use: No  . Drug Use: No  . Sexual Activity: Not on file   Other Topics Concern  . Not on file   Social History Narrative   1 son   Has living will   Wife, then son, have health care POA   Would accept resuscitation attempts but no prolonged artificial ventilation   Would not want tube feeds if cognitively unaware   Review of Systems  Constitutional: Negative for fatigue and unexpected weight change.       Wears seat belt  HENT: Positive for hearing loss. Negative for dental problem and tinnitus.        Regular with dentist  Eyes: Positive for visual disturbance.       Blurred in right eye  Respiratory: Negative for cough, chest tightness and shortness of breath.   Cardiovascular: Negative for chest pain, palpitations and leg swelling.  Gastrointestinal: Negative for nausea,  vomiting, abdominal pain, constipation and blood in stool.       No heartburn  Endocrine: Negative for cold intolerance and heat intolerance.  Genitourinary: Negative for urgency and difficulty urinating.       Stable nocturia at 2 per night No sex-- no problem  Musculoskeletal: Positive for arthralgias. Negative for back pain and joint swelling.       Mild pain in hands Trigger finger fixed--but some pain in thumbs with his weight work at [employer]  Skin: Negative for rash.       No suspicious lesions  Allergic/Immunologic: Negative for environmental allergies and immunocompromised state.  Neurological: Negative for dizziness, syncope, weakness, light-headedness, numbness and headaches.  Hematological: Negative for adenopathy. Does not bruise/bleed easily.  Psychiatric/Behavioral: Positive for sleep disturbance. Negative for dysphoric mood. The patient is not nervous/anxious.        Disjointed  sleep--nothing new. Awakens refreshed       Objective:   Physical Exam  Constitutional: He is oriented to person, place, and time. He appears well-developed and well-nourished. No distress.  HENT:  Head: Normocephalic and atraumatic.  Right Ear: External ear normal.  Left Ear: External ear normal.  Mouth/Throat: Oropharynx is clear and moist. No oropharyngeal exudate.  Eyes: Conjunctivae and EOM are normal. Pupils are equal, round, and reactive to light.  Neck: Normal range of motion. Neck supple. No thyromegaly present.  Cardiovascular: Normal rate, regular rhythm and intact distal pulses.  Exam reveals no gallop.   Murmur heard. K8/6 systolic murmur along left sternal border  Pulmonary/Chest: Effort normal and breath sounds normal. No respiratory distress. He has no wheezes. He has no rales.  Abdominal: Soft. He exhibits no distension. There is no tenderness.  Musculoskeletal: He exhibits no edema and no tenderness.  Lymphadenopathy:    He has no cervical adenopathy.  Neurological: He is alert and oriented to person, place, and time.  Skin: No rash noted. No erythema.  Psychiatric: He has a normal mood and affect. His behavior is normal.          Assessment & Plan:

## 2014-02-08 LAB — LIPID PANEL
Cholesterol: 208 mg/dL — ABNORMAL HIGH (ref 0–200)
HDL: 48.7 mg/dL (ref >39.00)
NonHDL: 159.3
Total CHOL/HDL Ratio: 4
Triglycerides: 230 mg/dL — ABNORMAL HIGH (ref 0.0–149.0)
VLDL: 46 mg/dL — ABNORMAL HIGH (ref 0.0–40.0)

## 2014-02-08 LAB — COMPREHENSIVE METABOLIC PANEL
ALT: 30 U/L (ref 0–53)
AST: 29 U/L (ref 0–37)
Albumin: 4.4 g/dL (ref 3.5–5.2)
Alkaline Phosphatase: 65 U/L (ref 39–117)
BUN: 19 mg/dL (ref 6–23)
CHLORIDE: 104 meq/L (ref 96–112)
CO2: 27 meq/L (ref 19–32)
Calcium: 9.3 mg/dL (ref 8.4–10.5)
Creatinine, Ser: 1.1 mg/dL (ref 0.4–1.5)
GFR: 71.78 mL/min (ref >60.00)
Glucose, Bld: 81 mg/dL (ref 70–99)
Potassium: 4.4 mEq/L (ref 3.5–5.1)
Sodium: 138 mEq/L (ref 135–145)
Total Bilirubin: 0.7 mg/dL (ref 0.2–1.2)
Total Protein: 6.9 g/dL (ref 6.0–8.3)

## 2014-02-08 LAB — CBC WITH DIFFERENTIAL/PLATELET
BASOS ABS: 0 10*3/uL (ref 0.0–0.1)
Basophils Relative: 0.5 % (ref 0.0–3.0)
EOS ABS: 0.2 10*3/uL (ref 0.0–0.7)
Eosinophils Relative: 2.9 % (ref 0.0–5.0)
HEMATOCRIT: 43.7 % (ref 39.0–52.0)
Hemoglobin: 14.9 g/dL (ref 13.0–17.0)
LYMPHS ABS: 0.7 10*3/uL (ref 0.7–4.0)
Lymphocytes Relative: 12.7 % (ref 12.0–46.0)
MCHC: 34.1 g/dL (ref 30.0–36.0)
MCV: 89.6 fl (ref 78.0–100.0)
MONO ABS: 0.6 10*3/uL (ref 0.1–1.0)
MONOS PCT: 10.3 % (ref 3.0–12.0)
Neutro Abs: 4 10*3/uL (ref 1.4–7.7)
Neutrophils Relative %: 73.6 % (ref 43.0–77.0)
PLATELETS: 136 10*3/uL — AB (ref 150.0–400.0)
RBC: 4.87 Mil/uL (ref 4.22–5.81)
RDW: 12.8 % (ref 11.5–15.5)
WBC: 5.5 10*3/uL (ref 4.0–10.5)

## 2014-02-08 LAB — LDL CHOLESTEROL, DIRECT: LDL DIRECT: 142.7 mg/dL

## 2014-02-08 LAB — T4, FREE: FREE T4: 0.98 ng/dL (ref 0.60–1.60)

## 2014-02-08 LAB — PSA: PSA: 0.41 ng/mL (ref 0.10–4.00)

## 2014-04-04 ENCOUNTER — Ambulatory Visit: Payer: Self-pay | Admitting: Ophthalmology

## 2014-04-12 ENCOUNTER — Other Ambulatory Visit: Payer: Self-pay | Admitting: Family Medicine

## 2014-04-25 ENCOUNTER — Ambulatory Visit: Payer: Self-pay | Admitting: Ophthalmology

## 2014-05-17 DIAGNOSIS — I619 Nontraumatic intracerebral hemorrhage, unspecified: Secondary | ICD-10-CM

## 2014-05-17 HISTORY — DX: Nontraumatic intracerebral hemorrhage, unspecified: I61.9

## 2014-05-28 ENCOUNTER — Emergency Department: Payer: Self-pay | Admitting: Emergency Medicine

## 2014-05-28 LAB — COMPREHENSIVE METABOLIC PANEL
ANION GAP: 8 (ref 7–16)
Albumin: 3.7 g/dL (ref 3.4–5.0)
Alkaline Phosphatase: 71 U/L
BILIRUBIN TOTAL: 0.5 mg/dL (ref 0.2–1.0)
BUN: 16 mg/dL (ref 7–18)
CO2: 23 mmol/L (ref 21–32)
Calcium, Total: 8.5 mg/dL (ref 8.5–10.1)
Chloride: 108 mmol/L — ABNORMAL HIGH (ref 98–107)
Creatinine: 1.01 mg/dL (ref 0.60–1.30)
EGFR (African American): >60
EGFR (Non-African Amer.): >60
GLUCOSE: 131 mg/dL — AB (ref 65–99)
OSMOLALITY: 281 (ref 275–301)
Potassium: 3.7 mmol/L (ref 3.5–5.1)
SGOT(AST): 31 U/L (ref 15–37)
SGPT (ALT): 32 U/L
SODIUM: 139 mmol/L (ref 136–145)
Total Protein: 6.7 g/dL (ref 6.4–8.2)

## 2014-05-28 LAB — CBC WITH DIFFERENTIAL/PLATELET
BASOS PCT: 0.6 %
Basophil #: 0 10*3/uL (ref 0.0–0.1)
Eosinophil #: 0.1 10*3/uL (ref 0.0–0.7)
Eosinophil %: 3.2 %
HCT: 41.8 % (ref 40.0–52.0)
HGB: 14.3 g/dL (ref 13.0–18.0)
Lymphocyte #: 0.6 10*3/uL — ABNORMAL LOW (ref 1.0–3.6)
Lymphocyte %: 13.8 %
MCH: 30.8 pg (ref 26.0–34.0)
MCHC: 34.1 g/dL (ref 32.0–36.0)
MCV: 90 fL (ref 80–100)
MONO ABS: 0.5 x10 3/mm (ref 0.2–1.0)
Monocyte %: 10.9 %
NEUTROS ABS: 3.2 10*3/uL (ref 1.4–6.5)
Neutrophil %: 71.5 %
PLATELETS: 130 10*3/uL — AB (ref 150–440)
RBC: 4.63 10*6/uL (ref 4.40–5.90)
RDW: 12.7 % (ref 11.5–14.5)
WBC: 4.4 10*3/uL (ref 3.8–10.6)

## 2014-05-28 LAB — PROTIME-INR
INR: 1
PROTHROMBIN TIME: 13 s (ref 11.5–14.7)

## 2014-05-28 LAB — CK-MB: CK-MB: 1.3 ng/mL (ref 0.5–3.6)

## 2014-05-28 LAB — TROPONIN I

## 2014-06-07 ENCOUNTER — Encounter: Payer: Self-pay | Admitting: Internal Medicine

## 2014-06-07 DIAGNOSIS — I619 Nontraumatic intracerebral hemorrhage, unspecified: Secondary | ICD-10-CM | POA: Insufficient documentation

## 2014-06-20 ENCOUNTER — Telehealth: Payer: Self-pay

## 2014-06-20 NOTE — Telephone Encounter (Signed)
Mrs Adkison left v/m; pt is presently at [facility]; pt had stroke 05/28/14 and will be transferred to [facility] on 06/23/14. Mrs Laws wants to know what to do next; Mrs Makela was advised pt needed to be seen by PCP in first 30 days of admission to [facility]; Mrs Carstens wants to verify that is correct and is anything else needed. Mrs Carandang request cb.

## 2014-06-20 NOTE — Telephone Encounter (Signed)
Let her know that I don't go to [facility] and that Dr Ouida Sills, their medical director, usually sees my patients when they are there. I would then see him again when he is able to get home again.  I heard about the stroke. Let her know that my prayers go out for both of them

## 2014-06-20 NOTE — Telephone Encounter (Signed)
Spoke with wife and she will schedule a follow-up once he leaves [facility]

## 2014-06-21 ENCOUNTER — Encounter: Payer: Self-pay | Admitting: Internal Medicine

## 2014-07-04 LAB — COMPREHENSIVE METABOLIC PANEL
ALK PHOS: 97 U/L
AST: 36 U/L (ref 15–37)
Albumin: 3.5 g/dL (ref 3.4–5.0)
Anion Gap: 5 — ABNORMAL LOW (ref 7–16)
BILIRUBIN TOTAL: 0.5 mg/dL (ref 0.2–1.0)
BUN: 14 mg/dL (ref 7–18)
Calcium, Total: 8.6 mg/dL (ref 8.5–10.1)
Chloride: 106 mmol/L (ref 98–107)
Co2: 31 mmol/L (ref 21–32)
Creatinine: 0.95 mg/dL (ref 0.60–1.30)
EGFR (African American): >60
Glucose: 103 mg/dL — ABNORMAL HIGH (ref 65–99)
Osmolality: 284 (ref 275–301)
Potassium: 3.5 mmol/L (ref 3.5–5.1)
SGPT (ALT): 58 U/L
SODIUM: 142 mmol/L (ref 136–145)
TOTAL PROTEIN: 6.6 g/dL (ref 6.4–8.2)

## 2014-07-04 LAB — TROPONIN I: Troponin-I: <0.02 ng/mL

## 2014-07-04 LAB — CBC WITH DIFFERENTIAL/PLATELET
Basophil #: 0 10*3/uL (ref 0.0–0.1)
Basophil %: 0.7 %
EOS ABS: 0.2 10*3/uL (ref 0.0–0.7)
Eosinophil %: 4.1 %
HCT: 42.1 % (ref 40.0–52.0)
HGB: 14.3 g/dL (ref 13.0–18.0)
LYMPHS ABS: 0.8 10*3/uL — AB (ref 1.0–3.6)
Lymphocyte %: 16.7 %
MCH: 31.1 pg (ref 26.0–34.0)
MCHC: 34.1 g/dL (ref 32.0–36.0)
MCV: 91 fL (ref 80–100)
Monocyte #: 0.6 x10 3/mm (ref 0.2–1.0)
Monocyte %: 11.9 %
NEUTROS PCT: 66.6 %
Neutrophil #: 3.2 10*3/uL (ref 1.4–6.5)
PLATELETS: 108 10*3/uL — AB (ref 150–440)
RBC: 4.61 10*6/uL (ref 4.40–5.90)
RDW: 13 % (ref 11.5–14.5)
WBC: 4.7 10*3/uL (ref 3.8–10.6)

## 2014-07-04 LAB — APTT: ACTIVATED PTT: 29.8 s (ref 23.6–35.9)

## 2014-07-04 LAB — PROTIME-INR
INR: 1
Prothrombin Time: 13.5 secs (ref 11.5–14.7)

## 2014-07-05 ENCOUNTER — Inpatient Hospital Stay: Payer: Self-pay | Admitting: Internal Medicine

## 2014-07-05 LAB — URINALYSIS, COMPLETE
BACTERIA: NONE SEEN
BILIRUBIN, UR: NEGATIVE
BLOOD: NEGATIVE
GLUCOSE, UR: NEGATIVE mg/dL (ref 0–75)
Ketone: NEGATIVE
LEUKOCYTE ESTERASE: NEGATIVE
NITRITE: NEGATIVE
Ph: 6 (ref 4.5–8.0)
Protein: NEGATIVE
SQUAMOUS EPITHELIAL: NONE SEEN
Specific Gravity: 1.01 (ref 1.003–1.030)
WBC UR: NONE SEEN /HPF (ref 0–5)

## 2014-07-06 ENCOUNTER — Ambulatory Visit: Payer: Self-pay | Admitting: Internal Medicine

## 2014-07-06 LAB — LIPID PANEL
CHOLESTEROL: 121 mg/dL (ref 0–200)
HDL Cholesterol: 47 mg/dL (ref 40–60)
Ldl Cholesterol, Calc: 49 mg/dL (ref 0–100)
TRIGLYCERIDES: 125 mg/dL (ref 0–200)
VLDL Cholesterol, Calc: 25 mg/dL (ref 5–40)

## 2014-07-18 ENCOUNTER — Encounter: Payer: Self-pay | Admitting: Internal Medicine

## 2014-07-20 ENCOUNTER — Ambulatory Visit: Payer: Self-pay | Admitting: Internal Medicine

## 2014-07-22 ENCOUNTER — Telehealth: Payer: Self-pay | Admitting: Internal Medicine

## 2014-07-22 NOTE — Telephone Encounter (Signed)
[facility] called during her evaluation. Her phone number is  [phone] Pt has been discharged from [facility] after stroke and [facility] suggested pt and ot. She is asking for verbal order for home health aide to assist with bathing.  [phone]

## 2014-07-27 ENCOUNTER — Ambulatory Visit (INDEPENDENT_AMBULATORY_CARE_PROVIDER_SITE_OTHER): Payer: PPO | Admitting: Internal Medicine

## 2014-07-27 ENCOUNTER — Encounter: Payer: Self-pay | Admitting: Internal Medicine

## 2014-07-27 VITALS — BP 120/70 | HR 68 | Temp 98.1°F | Wt 199.0 lb

## 2014-07-27 DIAGNOSIS — F329 Major depressive disorder, single episode, unspecified: Secondary | ICD-10-CM

## 2014-07-27 DIAGNOSIS — IMO0002 Reserved for concepts with insufficient information to code with codable children: Secondary | ICD-10-CM | POA: Insufficient documentation

## 2014-07-27 DIAGNOSIS — I619 Nontraumatic intracerebral hemorrhage, unspecified: Secondary | ICD-10-CM

## 2014-07-27 DIAGNOSIS — I69398 Other sequelae of cerebral infarction: Secondary | ICD-10-CM | POA: Insufficient documentation

## 2014-07-27 DIAGNOSIS — I6350 Cerebral infarction due to unspecified occlusion or stenosis of unspecified cerebral artery: Secondary | ICD-10-CM

## 2014-07-27 MED ORDER — CLONAZEPAM 0.5 MG PO TABS: 0.2500 mg | ORAL_TABLET | Freq: Every evening | ORAL | Status: DC | PRN

## 2014-07-27 NOTE — Progress Notes (Signed)
Pre visit review using our clinic review tool, if applicable. No additional management support is needed unless otherwise documented below in the visit note. 

## 2014-07-27 NOTE — Progress Notes (Signed)
Subjective:    Patient ID: Keith Hall, male    DOB: 1946/02/13, 69 y.o.   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 Admitted overnight---showed small ischemic stroke near prior bleed Numbness did improve  Still some residual numbness thumb and second finger Left wrist is still bad from the fall--- has attachement for arm on walker Walking with rolling walker Still with spasms in left arm/hand Help with dressing and bathing Can't do stairs--has hospital bed in living room Can use the bathroom alone Hearing is poor now Left face is numb---does affect his eating. But able to swallow okay No apparent cognitive problems  Current Outpatient Prescriptions on File Prior to Visit  Medication Sig Dispense Refill  . Glucosamine-Chondroitin (MOVE FREE PO) Take 2 tablets by mouth daily.    . Ibuprofen (ADVIL) 200 MG CAPS Take 2 capsules by mouth 2 (two) times daily.     No current facility-administered medications on file prior to visit.    Allergies  Allergen Reactions  . Penicillins Swelling    Past Medical History  Diagnosis Date  . Osteoarthritis of both knees   . Depression 1980's04/28/04    MDD---even needed hospitalization  . Kidney stone 1980 only  . Sudden cardiac death 10/13/99    no clear etiology of this  . BPH (benign prostatic hypertrophy)   . 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 Cardiovascular stress test  07/09/11    Negative--Dr Paraschos  . Cardiac catheterization  last in October 13, 2007   insignificant coronary disease  . Hernia repair  13-Oct-2010    left inguinal--Dr Byrnett  . Rotator cuff repair  2007/10/13    both done by Dr Margaretmary Eddy  . 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 Total knee arthroplasty Left 1/15    Dr [facility]    Family History  Problem Relation Age of Onset  . Stroke Mother   . COPD Sister   . Heart disease Neg Hx   . Hypertension Neg Hx   . Diabetes Son     Type 1    History   Social History  . Marital Status: Married    Spouse Name: N/A  . Number of Children: 1  . Years of Education: N/A   Occupational History  . Outside sales of furniture     Retired at [employer]  . Smoking status: Never Smoker   . Smokeless tobacco: Never Used  . Alcohol Use: No  . Drug Use: No  . Sexual Activity: Not on file   Other Topics Concern  . Not on file   Social History Narrative   1 son   Has living will   Wife, then son, have health care POA   Would accept resuscitation attempts but no prolonged artificial ventilation   Would not want tube feeds if cognitively unaware   Review of Systems Did feel depressed after the  stroke---is on citalopram now Sleeps with clonazepam-- needs the regular one (not the disolvable one) Had considerable nerve pain at first---why he was on the gabapentin No problems with bowels    Objective:   Physical Exam  Constitutional: He appears well-developed and well-nourished. No distress.  Neck: Normal range of motion. Neck supple. No thyromegaly present.  Cardiovascular: Normal rate and regular rhythm.   Soft systolic murmur along the left sternal border  Pulmonary/Chest: Effort normal and breath sounds normal. No respiratory distress. He has no wheezes. He has no rales.  Abdominal: Soft. There is no tenderness.  Musculoskeletal: He exhibits no edema.  Lymphadenopathy:    He has no cervical adenopathy.  Neurological:  Fairly normal strength in left side but some jerkiness to the  movement Normal tone  Psychiatric: He has a normal mood and affect. His behavior is normal.          Assessment & Plan:

## 2014-07-27 NOTE — Assessment & Plan Note (Signed)
Mood is better now Will continue the citalopram for 6 months--then consider wean

## 2014-07-27 NOTE — Assessment & Plan Note (Signed)
Doing fairly well Home now Will stop the gabapentin--but restart if he gets pain again on the left side  Extensive review of [facility] records On ASA, statin, carvedilol

## 2014-07-29 ENCOUNTER — Other Ambulatory Visit: Payer: Self-pay

## 2014-07-29 MED ORDER — ATORVASTATIN CALCIUM 40 MG PO TABS: 40.0000 mg | ORAL_TABLET | Freq: Every day | ORAL | Status: DC

## 2014-07-29 MED ORDER — CITALOPRAM HYDROBROMIDE 10 MG PO TABS: 10.0000 mg | ORAL_TABLET | Freq: Two times a day (BID) | ORAL | Status: DC

## 2014-07-29 MED ORDER — CARVEDILOL 3.125 MG PO TABS: 3.1250 mg | ORAL_TABLET | Freq: Every day | ORAL | Status: DC

## 2014-07-29 MED ORDER — ASPIRIN 325 MG PO TBEC: 325.0000 mg | DELAYED_RELEASE_TABLET | Freq: Every day | ORAL | Status: DC

## 2014-07-29 MED ORDER — CLONAZEPAM 0.5 MG PO TABS: 0.2500 mg | ORAL_TABLET | Freq: Every evening | ORAL | Status: DC | PRN

## 2014-07-29 NOTE — Telephone Encounter (Signed)
Approved: I just did the clonazepam at his visit so that doesn't need to be done All others can be done for a year

## 2014-07-29 NOTE — Telephone Encounter (Signed)
Keith Hall request 90 day refills for ASA,atorvastatin,carvedilol,citalopram and clonazepam to [facility]; Keith Hall said [facility] has been previously prescribing.Please advise. Pt last seen 07/27/14.

## 2014-08-03 ENCOUNTER — Telehealth: Payer: Self-pay | Admitting: Internal Medicine

## 2014-08-03 NOTE — Telephone Encounter (Signed)
Izora Gala the OT therapist called wanting to get an order for Neuro  PT and OT sent to [facility].  She spoke with them at [facility] as soon as they get the order they can schedule him for next week at [place] Please advise spouse  when this done They want to go [facility] not [facility] pt fax number is [phone]

## 2014-08-03 NOTE — Telephone Encounter (Signed)
June pt spouse called wanting to get a order for neurological PT and OT sent to [facility].  She stated they have called [facility] and the are booked out for 2 weeks.  The in home OT AND PT therapist stated he need to get started ASAP Please let spouse know when this has been done

## 2014-08-04 NOTE — Telephone Encounter (Signed)
Order written. Please fax

## 2014-08-04 NOTE — Telephone Encounter (Signed)
Patient's wife called back. Notified her as directed. She verbalized understanding.

## 2014-08-04 NOTE — Telephone Encounter (Signed)
Faxed order to [phone]. Tried to call spouse but no answer. Will try again.

## 2014-08-09 ENCOUNTER — Encounter: Payer: Self-pay | Admitting: Internal Medicine

## 2014-08-10 NOTE — Telephone Encounter (Signed)
Ms Simkin called to ck status of refill request;advised done on 07/29/14 and Mrs Pautz will contact pharmacy.

## 2014-08-11 DIAGNOSIS — G629 Polyneuropathy, unspecified: Secondary | ICD-10-CM | POA: Diagnosis not present

## 2014-08-11 DIAGNOSIS — I69393 Ataxia following cerebral infarction: Secondary | ICD-10-CM | POA: Diagnosis not present

## 2014-08-11 DIAGNOSIS — I2510 Atherosclerotic heart disease of native coronary artery without angina pectoris: Secondary | ICD-10-CM | POA: Diagnosis not present

## 2014-08-11 DIAGNOSIS — I69354 Hemiplegia and hemiparesis following cerebral infarction affecting left non-dominant side: Secondary | ICD-10-CM | POA: Diagnosis not present

## 2014-08-16 ENCOUNTER — Encounter
Admit: 2014-08-16 | Disposition: A | Discharge status: Home / Self Care | Payer: Self-pay | Attending: Internal Medicine | Admitting: Internal Medicine

## 2014-08-23 ENCOUNTER — Ambulatory Visit (INDEPENDENT_AMBULATORY_CARE_PROVIDER_SITE_OTHER): Payer: PPO | Admitting: Family Medicine

## 2014-08-23 ENCOUNTER — Telehealth: Payer: Self-pay | Admitting: Internal Medicine

## 2014-08-23 ENCOUNTER — Encounter: Payer: Self-pay | Admitting: Family Medicine

## 2014-08-23 VITALS — BP 118/72 | HR 69 | Temp 97.7°F | Ht 69.0 in | Wt 197.1 lb

## 2014-08-23 DIAGNOSIS — R350 Frequency of micturition: Secondary | ICD-10-CM

## 2014-08-23 DIAGNOSIS — R5383 Other fatigue: Secondary | ICD-10-CM | POA: Insufficient documentation

## 2014-08-23 LAB — COMPREHENSIVE METABOLIC PANEL
ALBUMIN: 4.6 g/dL (ref 3.5–5.2)
ALK PHOS: 83 U/L (ref 39–117)
ALT: 27 U/L (ref 0–53)
AST: 25 U/L (ref 0–37)
BUN: 14 mg/dL (ref 6–23)
CALCIUM: 9.8 mg/dL (ref 8.4–10.5)
CHLORIDE: 104 meq/L (ref 96–112)
CO2: 32 mEq/L (ref 19–32)
Creatinine, Ser: 0.84 mg/dL (ref 0.40–1.50)
GFR: 95.78 mL/min (ref >60.00)
GLUCOSE: 93 mg/dL (ref 70–99)
POTASSIUM: 4.4 meq/L (ref 3.5–5.1)
SODIUM: 139 meq/L (ref 135–145)
TOTAL PROTEIN: 7.3 g/dL (ref 6.0–8.3)
Total Bilirubin: 1 mg/dL (ref 0.2–1.2)

## 2014-08-23 LAB — POCT URINALYSIS DIPSTICK
BILIRUBIN UA: NEGATIVE
Blood, UA: NEGATIVE
GLUCOSE UA: NEGATIVE
Ketones, UA: NEGATIVE
Leukocytes, UA: NEGATIVE
NITRITE UA: NEGATIVE
Protein, UA: NEGATIVE
Spec Grav, UA: 1.02
UROBILINOGEN UA: NEGATIVE
pH, UA: 6

## 2014-08-23 LAB — CBC WITH DIFFERENTIAL/PLATELET
Basophils Absolute: 0 10*3/uL (ref 0.0–0.1)
Basophils Relative: 0.4 % (ref 0.0–3.0)
Eosinophils Absolute: 0.2 10*3/uL (ref 0.0–0.7)
Eosinophils Relative: 3.6 % (ref 0.0–5.0)
HCT: 41.9 % (ref 39.0–52.0)
Hemoglobin: 14.4 g/dL (ref 13.0–17.0)
LYMPHS ABS: 0.7 10*3/uL (ref 0.7–4.0)
LYMPHS PCT: 11.9 % — AB (ref 12.0–46.0)
MCHC: 34.4 g/dL (ref 30.0–36.0)
MCV: 89.8 fl (ref 78.0–100.0)
MONOS PCT: 11.3 % (ref 3.0–12.0)
Monocytes Absolute: 0.6 10*3/uL (ref 0.1–1.0)
Neutro Abs: 4.1 10*3/uL (ref 1.4–7.7)
Neutrophils Relative %: 72.8 % (ref 43.0–77.0)
PLATELETS: 158 10*3/uL (ref 150.0–400.0)
RBC: 4.66 Mil/uL (ref 4.22–5.81)
RDW: 13.4 % (ref 11.5–15.5)
WBC: 5.6 10*3/uL (ref 4.0–10.5)

## 2014-08-23 LAB — TSH: TSH: 1.56 u[IU]/mL (ref 0.35–4.50)

## 2014-08-23 LAB — VITAMIN B12: VITAMIN B 12: 604 pg/mL (ref 211–911)

## 2014-08-23 NOTE — Telephone Encounter (Signed)
Patient Name: Keith Hall DOB: [birth year] Initial Comment Caller states her husband feels tired, is in outpatient phys therapy for recent strokes, heart rate 110, has upcoming appt, xfer from office for triage Nurse Assessment Nurse: Vallery Sa, RN, Tye [place] Date/Time Eilene Ghazi Time): 08/23/2014 9:26:21 AM Confirm and document reason for call. If symptomatic, describe symptoms. ---Caller states her husband developed fatigue again this morning. No severe breathing difficulty. No chest pain. No fast heart rate this morning. Has the patient traveled out of the country within the last 30 days? ---No Does the patient require triage? ---Yes Related visit to physician within the last 2 weeks? ---No Does the PT have any chronic conditions? (i.e. diabetes, asthma, etc.) ---Yes List chronic conditions. ---Strokes, High Blood Pressure and Cholesterol Guidelines Guideline Title Affirmed Question Affirmed Notes Weakness (Generalized) and Fatigue [1] MODERATE weakness (i.e., interferes with work, school, normal activities) AND [2] cause unknown (Exceptions: weakness with acute minor illness, or weakness from poor fluid intake) Final Disposition User See Physician within 4 Hours (or PCP triage) Vallery Sa, RN, Tye [place] Comments Scheduled 11:45am appointment with Dr. Glori Bickers.

## 2014-08-23 NOTE — Telephone Encounter (Signed)
PLEASE NOTE: All timestamps contained within this report are represented as [place] Standard Time. CONFIDENTIALTY NOTICE: This fax transmission is intended only for the addressee. It contains information that is legally privileged, confidential or otherwise protected from use or disclosure. If you are not the intended recipient, you are strictly prohibited from reviewing, disclosing, copying using or disseminating any of this information or taking any action in reliance on or regarding this information. If you have received this fax in error, please notify us immediately by telephone so that we can arrange for its return to [place]. Phone: [phone], Toll-Free: [phone], Fax: [phone] Page: 1 of 2 Call Id: [id number] [place] Patient Name: Keith Hall Gender: Male DOB: Apr 26, 1946 Age: 69 Y 9 M 28 D Return Phone Number: [phone] (Primary), [phone] (Secondary) Address: City/State/Zip: [place] - Client Client Site [place] - Day Physician Viviana Simpler Contact Type Call Call Type Triage / [place] Name June Relationship To Patient Spouse Appointment Disposition EMR Appointment Scheduled Return Phone Number [phone] (Primary) Chief Complaint Fatigue (>THREE MONTHS) Initial Comment Caller states her husband feels tired, is in outpatient phys therapy for recent strokes, heart rate 110, has upcoming appt, xfer from office for triage PreDisposition Call Doctor Info pasted into Epic Yes Nurse Assessment Nurse: Vallery Sa, RN, [place] Date/Time (Eastern Time): 08/23/2014 9:26:21 AM Confirm and document reason for call. If symptomatic, describe symptoms. ---Caller states her husband developed fatigue again this morning. No severe breathing difficulty. No chest pain. No fast heart rate this morning. Has the  patient traveled out of the country within the last 30 days? ---No Does the patient require triage? ---Yes Related visit to physician within the last 2 weeks? ---No Does the PT have any chronic conditions? (i.e. diabetes, asthma, etc.) ---Yes List chronic conditions. ---Strokes, High Blood Pressure and Cholesterol Guidelines Guideline Title Affirmed Question Affirmed Notes Nurse Date/Time (Eastern Time) Weakness (Generalized) and Fatigue [1] MODERATE weakness (i.e., interferes with work, school, normal activities) AND [2] cause unknown (Exceptions: weakness with acute minor illness, or weakness from poor fluid intake) [place], RN, [facility] 08/23/2014 9:29:57 AM Disp. Time Eilene Ghazi Time) Disposition Final User PLEASE NOTE: All timestamps contained within this report are represented as [place] Standard Time. CONFIDENTIALTY NOTICE: This fax transmission is intended only for the addressee. It contains information that is legally privileged, confidential or otherwise protected from use or disclosure. If you are not the intended recipient, you are strictly prohibited from reviewing, disclosing, copying using or disseminating any of this information or taking any action in reliance on or regarding this information. If you have received this fax in error, please notify us immediately by telephone so that we can arrange for its return to [place]. Phone: [phone], Toll-Free: [phone], Fax: [phone] Page: 2 of 2 Call Id: [id number] 08/23/2014 9:37:19 AM See Physician within 4 Hours (or PCP triage) Yes Vallery Sa, RN, [place]y [place] Understands: Yes Disagree/Comply: Comply Care Advice Given Per Guideline SEE PHYSICIAN WITHIN 4 HOURS (or PCP triage): * IF NO PCP TRIAGE: You need to be seen. Go to _______________ (ED/UCC or office if it will be open) within the next 3 or 4 hours. Go sooner if you become worse. BRING MEDICINES: * Please bring a list of your current medicines when you go to see  the doctor. * It is also a good idea to bring the pill bottles too. This will help the  doctor to make certain you are taking the right medicines and the right dose. CALL BACK IF: * You become worse. CARE ADVICE given per Weakness and Fatigue (Adult) guideline. After Care Instructions Given Call Event Type User Date / Time Description Comments User: Berton Mount, RN Date/Time Eilene Ghazi Time): 08/23/2014 9:39:23 AM Scheduled 11:45am appointment with Dr. Glori Bickers. Referrals REFERRED TO PCP OFFICE

## 2014-08-23 NOTE — Patient Instructions (Signed)
Labs and urine test today  If all is normal - your fatigue may be depression related- and changing medicine dose and counseling may be an option

## 2014-08-23 NOTE — Progress Notes (Signed)
Pre visit review using our clinic review tool, if applicable. No additional management support is needed unless otherwise documented below in the visit note. 

## 2014-08-23 NOTE — Telephone Encounter (Signed)
Pt has appt today at 11:45 with Dr Glori Bickers.

## 2014-08-23 NOTE — Progress Notes (Signed)
Subjective:    Patient ID: Keith Hall, male    DOB: 19-May-1946, 69 y.o.   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 ischemic and one thromb  Has been going to PT -that is going well   Looks forward to PT - but it does wear him out   Lab Results  Component Value Date   WBC 5.5 02/07/2014   HGB 14.9 02/07/2014   HCT 43.7 02/07/2014   MCV 89.6 02/07/2014   PLT 136.0* 02/07/2014      Chemistry      Component Value Date/Time   NA 138 02/07/2014 1611   K 4.4 02/07/2014 1611   CL 104 02/07/2014 1611   CO2 27 02/07/2014 1611   BUN 19 02/07/2014 1611   CREATININE 1.1 02/07/2014 1611      Component Value Date/Time   CALCIUM 9.3 02/07/2014 1611   ALKPHOS 65 02/07/2014 1611   AST 29 02/07/2014 1611   ALT 30 02/07/2014 1611   BILITOT 0.7 02/07/2014 1611        Review of Systems Review of Systems  Constitutional: Negative for fever, appetite change, and unexpected weight change. pos for fatigue  Eyes: Negative for pain and visual disturbance.  Respiratory: Negative for cough and shortness of breath.   Cardiovascular: Negative for cp or palpitations    Gastrointestinal: Negative for nausea, diarrhea and constipation.  Genitourinary: Negative for urgency and frequency.  Skin: Negative for pallor or rash   Neurological: Negative for weakness, light-headedness, numbness and headaches.  Hematological: Negative for adenopathy. Does not bruise/bleed easily.    Psychiatric/Behavioral: The patient is not nervous/anxious.  pos for general dysphoric mood and frustration and irritability       Objective:   Physical Exam  Constitutional: He appears well-developed and well-nourished. No distress.  HENT:  Head: Normocephalic and atraumatic.  Mouth/Throat: Oropharynx is clear and moist.  Eyes: Conjunctivae and EOM are normal. Pupils are equal, round, and reactive to light. No scleral icterus.  Neck: Normal range of motion. Neck supple. No JVD present. Carotid bruit is not present. No thyromegaly present.  Cardiovascular: Normal rate and regular rhythm.   Murmur heard. Pulmonary/Chest: Effort normal and breath sounds normal. No respiratory distress. He has no wheezes. He has no rales. He exhibits no tenderness.  No crackles   Abdominal: Soft. Bowel sounds are normal. He exhibits no distension and no mass. There is no tenderness. There is no rebound and no guarding.  Musculoskeletal: He exhibits no edema.  Lymphadenopathy:    He has no cervical adenopathy.  Neurological: He is alert.  Skin: Skin is warm and dry. No rash noted. No erythema. No pallor.  Psychiatric: His behavior is normal. Thought content normal. His affect is not blunt and not labile. His speech is tangential. He exhibits a depressed mood.  Pt is somewhat irritable Very tangential in speech-at times hard to follow Some friction with his wife in the room           Assessment & Plan:   Problem List Items Addressed This Visit      Other   Fatigue - Primary    Disc this in detail  Poss due to depression and also fatigue from newly ramped up PT after CVA He agrees somewhat Lab today F/u with pcp to disc plan next week  Some friction between he and his wife today      Relevant Orders   CBC with Differential/Platelet (Completed)   Comprehensive metabolic panel (Completed)   TSH (Completed)   Vitamin B12 (Completed)   POCT urinalysis dipstick (Completed)   Frequent  urination    ua today  No other symptoms but fatigue      Relevant Orders   POCT urinalysis dipstick (Completed)

## 2014-08-23 NOTE — Telephone Encounter (Signed)
Pt was seen

## 2014-08-25 NOTE — Assessment & Plan Note (Signed)
Disc this in detail  Poss due to depression and also fatigue from newly ramped up PT after CVA He agrees somewhat Lab today F/u with pcp to disc plan next week  Some friction between he and his wife today

## 2014-08-25 NOTE — Assessment & Plan Note (Signed)
ua today  No other symptoms but fatigue

## 2014-08-29 ENCOUNTER — Ambulatory Visit (INDEPENDENT_AMBULATORY_CARE_PROVIDER_SITE_OTHER): Payer: PPO | Admitting: Internal Medicine

## 2014-08-29 ENCOUNTER — Encounter: Payer: Self-pay | Admitting: Internal Medicine

## 2014-08-29 VITALS — BP 118/68 | HR 71 | Temp 98.0°F | Wt 197.0 lb

## 2014-08-29 DIAGNOSIS — R5383 Other fatigue: Secondary | ICD-10-CM

## 2014-08-29 NOTE — Assessment & Plan Note (Signed)
Easy somnolence---not used to sitting around. Sleeps okay at night--asked him to try reduced dose of clonazepam, just in case Depression is controlled Does well in therapy and when stimulated (like when out or having visitors) Labs normal

## 2014-08-29 NOTE — Patient Instructions (Signed)
Please try only 1/2 of the clonazepam at night to see if that helps your daytime energy levels.

## 2014-08-29 NOTE — Progress Notes (Signed)
Pre visit review using our clinic review tool, if applicable. No additional management support is needed unless otherwise documented below in the visit note. 

## 2014-08-29 NOTE — Progress Notes (Signed)
Subjective:    Patient ID: Keith Hall, male    DOB: [birth year], 69 y.o.   MRN: [id number]  HPI Here with wife to review the fatigue he was seen for last week  Had gotten to feeling tired Able to participate in his therapy--butr would fall asleep easily if just sitting Has been doing home exercise program  Still has lump in left wrist since his fall Some numbness in 4th and 5th fingers Does do exercise with ball--squeezing regularly Still with soreness in thumb there also  No SOB No change in strength ---left side still not normal though (slowly improving) Sensory changes slowly improving in left foot Reviewed normal labs  Current Outpatient Prescriptions on File Prior to Visit  Medication Sig Dispense Refill  . acetaminophen (TYLENOL) 500 MG tablet Take 500 mg by mouth 3 (three) times daily as needed.     Marland Kitchen aspirin 325 MG EC tablet Take 1 tablet (325 mg total) by mouth daily. 90 tablet 1  . atorvastatin (LIPITOR) 40 MG tablet Take 1 tablet (40 mg total) by mouth daily. 90 tablet 1  . carvedilol (COREG) 3.125 MG tablet Take 1 tablet (3.125 mg total) by mouth daily. 90 tablet 1  . citalopram (CELEXA) 10 MG tablet Take 1 tablet (10 mg total) by mouth 2 (two) times daily. 180 tablet 1  . clonazePAM (KLONOPIN) 0.5 MG tablet Take 0.5-1 tablets (0.25-0.5 mg total) by mouth at bedtime as needed for anxiety. 90 tablet 0  . Glucosamine-Chondroitin (MOVE FREE PO) Take 2 tablets by mouth daily.    . Multiple Vitamin (MULTI-VITAMINS) TABS Take 1 tablet by mouth daily.      No current facility-administered medications on file prior to visit.    Allergies  Allergen Reactions  . Penicillins Swelling    Past Medical History  Diagnosis Date  . Osteoarthritis of both knees   . Depression 1980's06-Apr-2004    MDD---even needed hospitalization  . Kidney stone 1980 only  . Sudden cardiac death Sep 21, 1999    no clear etiology of this  . BPH (benign prostatic hypertrophy)   . 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 Cardiovascular stress test  07/09/11    Negative--Dr Paraschos  . Cardiac catheterization  last in 09/21/2007    insignificant coronary disease  . Hernia repair  Sep 21, 2010    left inguinal--Dr Byrnett  . Rotator cuff repair  09/21/2007    both done by Dr Margaretmary Eddy  . 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 Total knee arthroplasty Left 1/15    Dr [facility]    Family History  Problem Relation Age of Onset  . Stroke Mother   . COPD Sister   . Heart disease Neg Hx   . Hypertension Neg Hx   . Diabetes Son     Type 1    History   Social History  . Marital Status: Married    Spouse Name: N/A  . Number of Children: 1  . Years of Education: N/A   Occupational History  . Outside sales of furniture     Retired at [employer]  . Smoking status: Never Smoker   . Smokeless tobacco: Never Used  . Alcohol Use: No  . Drug Use: No  . Sexual Activity: Not on file   Other Topics Concern  .  Not on file   Social History Narrative   1 son   Has living will   Wife, then son, have health care POA   Would accept resuscitation attempts but no prolonged artificial ventilation   Would not want tube feeds if cognitively unaware   Review of Systems Goes to sleep 9PM--only sleeps for 2-3 hours. Then takes the clonazepam and will sleep till 6AM or so. Has chronic sleep issues--- for decades Appetite is so-so Weight is stable     Objective:   Physical Exam  Constitutional: He appears well-developed and well-nourished. No distress.  Neck: Normal range of motion. Neck supple. No thyromegaly present.  Cardiovascular: Normal rate, regular rhythm and normal heart sounds.  Exam reveals no gallop.   No murmur heard. Pulmonary/Chest: Effort normal and breath sounds normal. No respiratory distress. He has no wheezes. He has no rales.    Lymphadenopathy:    He has no cervical adenopathy.  Psychiatric: He has a normal mood and affect. His behavior is normal.          Assessment & Plan:

## 2014-09-16 ENCOUNTER — Encounter
Admit: 2014-09-16 | Disposition: A | Discharge status: Home / Self Care | Payer: Self-pay | Attending: Internal Medicine | Admitting: Internal Medicine

## 2014-09-19 ENCOUNTER — Telehealth: Payer: Self-pay | Admitting: *Deleted

## 2014-09-19 MED ORDER — CARVEDILOL 3.125 MG PO TABS: 3.1250 mg | ORAL_TABLET | Freq: Two times a day (BID) | ORAL | Status: DC

## 2014-09-19 NOTE — Telephone Encounter (Signed)
Carvedilol is always bid ---I don't know how this got to be just daily. He should take it twice a day Send corrected prescription to his mail away pharmacy

## 2014-09-19 NOTE — Telephone Encounter (Signed)
rx sent to pharmacy by e-script  

## 2014-09-19 NOTE — Telephone Encounter (Signed)
Patient wife left a voicemail with questions regarding his Coreg. Patient's wife stating that the has been taking this two times a day which he was getting it from [facility].. Ms. Stillings stated that she received a script from him mail order pharmacy and it shows once a day. Patient's wife wants to know should he be taking the Coreg once a day or twice. Please advise.

## 2014-09-22 NOTE — Telephone Encounter (Signed)
Keith Hall notified as instructed from 09/19/14 note. Keith Matney voiced understanding and said pt had been taking twice a day.

## 2014-09-22 NOTE — Telephone Encounter (Signed)
Mrs Adebayo left v/m requesting cb about how pt should be taking carvedilol. Left message with pt (pt said he cannot hear well) and Mrs Trudel will cb.

## 2014-09-26 ENCOUNTER — Ambulatory Visit: Payer: PPO | Admitting: Internal Medicine

## 2014-09-27 ENCOUNTER — Other Ambulatory Visit: Payer: Self-pay | Admitting: Internal Medicine

## 2014-09-27 NOTE — Telephone Encounter (Signed)
Approved: 30 x 0 

## 2014-09-27 NOTE — Telephone Encounter (Signed)
07/29/14 

## 2014-09-27 NOTE — Telephone Encounter (Signed)
Rx called to pharmacy as instructed. 

## 2014-09-28 ENCOUNTER — Encounter: Payer: Self-pay | Admitting: Internal Medicine

## 2014-09-28 ENCOUNTER — Ambulatory Visit (INDEPENDENT_AMBULATORY_CARE_PROVIDER_SITE_OTHER): Payer: PPO | Admitting: Internal Medicine

## 2014-09-28 VITALS — BP 108/60 | HR 66 | Temp 97.7°F | Wt 199.0 lb

## 2014-09-28 DIAGNOSIS — R5383 Other fatigue: Secondary | ICD-10-CM

## 2014-09-28 DIAGNOSIS — I619 Nontraumatic intracerebral hemorrhage, unspecified: Secondary | ICD-10-CM

## 2014-09-28 DIAGNOSIS — I69359 Hemiplegia and hemiparesis following cerebral infarction affecting unspecified side: Secondary | ICD-10-CM | POA: Insufficient documentation

## 2014-09-28 DIAGNOSIS — I69859 Hemiplegia and hemiparesis following other cerebrovascular disease affecting unspecified side: Secondary | ICD-10-CM | POA: Diagnosis not present

## 2014-09-28 NOTE — Assessment & Plan Note (Signed)
This may be multifactorial Blood pressure is low--- repeat by me 114/70 on right Will stop the carvedilol Also, again told him not to take clonazepam in the middle of the night

## 2014-09-28 NOTE — Assessment & Plan Note (Signed)
Ongoing functional deficit of left arm which will probably never resolve Left leg is doing much better

## 2014-09-28 NOTE — Assessment & Plan Note (Signed)
Working through rehab process Now using cane Ongoing sensory loss in left arm which varies--- some of the radiation to arm may be related to separate shoulder issues  No evidence of new neuro event so reassured him

## 2014-09-28 NOTE — Progress Notes (Signed)
Subjective:    Patient ID: Keith Hall, male    DOB: 1945/07/08, 69 y.o.   MRN: [id number]  HPI Here with wife Has had some numbness in left arm since last visit Tingling in hand is more common When starting to fall asleep--he felt it more in forearm and then towards shoulder. It moves up from the hand Strength in arm and hand is still normal  Getting very tired Poor energy and "worn out" after bathing and dressing Some dizziness No syncope No chest pain or SOB --but wife notes labored sound just talking (but has laryngeal paralysis chronically)  When he sits forward he feels "the lung move forward and touch the chest wall" No regular cough  Now graduated from the walker to cane  Current Outpatient Prescriptions on File Prior to Visit  Medication Sig Dispense Refill  . acetaminophen (TYLENOL) 500 MG tablet Take 500 mg by mouth 3 (three) times daily as needed.     Marland Kitchen aspirin 325 MG EC tablet Take 1 tablet (325 mg total) by mouth daily. 90 tablet 1  . atorvastatin (LIPITOR) 40 MG tablet Take 1 tablet (40 mg total) by mouth daily. 90 tablet 1  . carvedilol (COREG) 3.125 MG tablet Take 1 tablet (3.125 mg total) by mouth 2 (two) times daily with a meal. 180 tablet 3  . citalopram (CELEXA) 10 MG tablet Take 1 tablet (10 mg total) by mouth 2 (two) times daily. 180 tablet 1  . clonazePAM (KLONOPIN) 0.5 MG tablet TAKE 1/2 - 1 TABLET EVERY DAY AT BEDTIME AS NEEDED FOR ANXIETY 30 tablet 0  . Glucosamine-Chondroitin (MOVE FREE PO) Take 2 tablets by mouth daily.    . Multiple Vitamin (MULTI-VITAMINS) TABS Take 1 tablet by mouth daily.      No current facility-administered medications on file prior to visit.    Allergies  Allergen Reactions  . Penicillins Swelling    Past Medical History  Diagnosis Date  . Osteoarthritis of both knees   . Depression 1980's2004-04-25    MDD---even needed hospitalization  . Kidney stone 1980 only  . Sudden cardiac death 10-10-1999    no clear etiology of  this  . BPH (benign prostatic hypertrophy)   . Intraparenchymal hemorrhage of brain 12/15    Right basal ganglia--Rx at [facility] and rehab    Past Surgical History  Procedure Laterality Date  . Cardiovascular stress test  07/09/11    Negative--Dr Paraschos  . Cardiac catheterization  last in 10/10/2007    insignificant coronary disease  . Hernia repair  October 10, 2010    left inguinal--Dr Byrnett  . Rotator cuff repair  10-10-07    both done by Dr Margaretmary Eddy  . 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 Total knee arthroplasty Left 1/15    Dr [facility]    Family History  Problem Relation Age of Onset  . Stroke Mother   . COPD Sister   . Heart disease Neg Hx   . Hypertension Neg Hx   . Diabetes Son     Type 1    History   Social History  . Marital Status: Married    Spouse Name: N/A  . Number of Children: 1  . Years of Education: N/A   Occupational History  . Outside sales of furniture     Retired at [employer]  . Smoking status: Never Smoker   . Smokeless tobacco: Never Used  . Alcohol  Use: No  . Drug Use: No  . Sexual Activity: Not on file   Other Topics Concern  . Not on file   Social History Narrative   1 son   Has living will   Wife, then son, have health care POA   Would accept resuscitation attempts but no prolonged artificial ventilation   Would not want tube feeds if cognitively unaware   Review of Systems  Appetite is fine Weight stable Sleeping okay--using 1/2 of the clonazepam (and often takes the other half in the middle of the night)     Objective:   Physical Exam  Constitutional: He appears well-developed and well-nourished. No distress.  Neck: Normal range of motion. Neck supple. No thyromegaly present.  Cardiovascular: Normal rate, regular rhythm and normal heart sounds.  Exam reveals no gallop.   No murmur heard. Pulmonary/Chest: Effort normal and breath sounds normal. No respiratory distress. He has no wheezes. He has  no rales. He exhibits no tenderness.  No dullness to percussion  Musculoskeletal: He exhibits no edema or tenderness.  Crepitus in left shoulder but good ROM  Lymphadenopathy:    He has no cervical adenopathy.  Neurological:  Good grip strength in left hand but unable to have it function well (doesn't use it)          Assessment & Plan:

## 2014-09-28 NOTE — Patient Instructions (Signed)
Please stop the carvedilol. Only take the clonazepam at bedtime--do not take more in the middle of the night.

## 2014-09-28 NOTE — Progress Notes (Signed)
Pre visit review using our clinic review tool, if applicable. No additional management support is needed unless otherwise documented below in the visit note. 

## 2014-10-07 NOTE — Op Note (Signed)
PATIENT NAME:  Keith Hall, Keith Hall MR#:  [id number] DATE OF BIRTH:  May 13, 1946  DATE OF PROCEDURE:  08/03/2012  PREOPERATIVE DIAGNOSIS:  Degenerative arthrosis of the right knee.   POSTOPERATIVE DIAGNOSIS:  Degenerative arthrosis of the left knee.   PROCEDURE PERFORMED:  Right total knee arthroplasty using computer-assisted navigation.   SURGEON:  Laurice Record. Hooten.   ASSISTANT:  Vance Peper, PA-C  ANESTHESIA:  Femoral nerve block and spinal.   ESTIMATED BLOOD LOSS:  50 mL.   FLUIDS  REPLACED:  2000 mL of crystalloid.   TOURNIQUET TIME:  71 minutes.   DRAINS: Two medium drains to reinfusion system.   SOFT TISSUE RELEASES:  Anterior cruciate ligament, posterior cruciate ligament, deep medial collateral ligament, patellofemoral ligament.   IMPLANTS UTILIZED:  DePuy PFC Sigma, size 5 posterior stabilized femoral component (cemented), size 4 MBT tibial component (cemented), 38 mm 3-peg oval dome patella (cemented) and a 10 mm stabilized rotating platform polyethylene insert.  INDICATIONS FOR SURGERY:  The patient is a 69 year old male who has been seen for complaints of progressive right knee pain.  X-rays demonstrated severe degenerative changes in a tricompartmental fashion with relative varus deformity. He had not seen any significant improvement despite conservative nonsurgical intervention. After discussion of the risks and benefits of surgical intervention, the patient expressed understanding of the risks, benefits and agreed with plans for surgical intervention.   PROCEDURE IN DETAIL: The patient was brought into the operating room and, after adequate femoral nerve block and spinal anesthesia was achieved, a tourniquet was placed on the patient's upper right thigh. The patient's right knee and leg were cleaned and prepped with alcohol and DuraPrep and draped in the usual sterile fashion. A "timeout" was performed as per usual protocol. The right lower extremity was exsanguinated using an  Esmarch, and tourniquet was inflated to 300 mmHg.  An anterior longitudinal incision was made followed by a standard mid vastus approach. A small effusion was evacuated. The deep fibers of the medial collateral ligament were elevated in a subperiosteal fashion off the medial flare of the tibia so as to maintain a continuous soft tissue sleeve. The patella was subluxed laterally and the patellofemoral ligament was incised. Inspection of the knee demonstrated severe degenerative changes with full thickness loss of articular cartilage to the medial compartment. Prominent osteophytes were debrided using a rongeur. Anterior and posterior cruciate ligaments were excised. Two 4.0 mm Schanz pins were inserted into the femur and into the tibia for attachment of the array of trackers used for computer-assisted navigation. The hip center was identified using circumduction technique. Distal landmarks were mapped using the computer. The distal femur and proximal tibia were mapped using the computer.  Distal femoral cutting guide was positioned using computer-assisted navigation so as to achieve 5 degrees distal valgus cut. Cut was performed and verified using the computer. The distal femur was sized and it was felt that a size 5 femoral component was appropriate. Appropriate. A size 5 cutting guide was positioned and the anterior cut was performed and verified using the computer. This was followed by completion of the posterior and chamfer cuts. Femoral cutting guide for the central box was then positioned and the central box cut was performed.   Attention was then reaction proximal tibia. Medial and lateral menisci were excised. The extramedullary tibial cutting guide was positioned using computer-assisted navigation so as to achieve 0-degree varus valgus alignment and 0-degree posterior slope. Cut was performed and verified using the computer. The proximal tibia was sized and  it was felt that a size 4 tibial tray was  appropriate. Tibial and femoral trials were inserted followed by insertion of a 10 mm polyethylene trial. Excellent mediolateral soft tissue balancing was appreciated both in full extension and in flexion. Finally, the patella was cut and prepared so as to accommodate a 38 mm, 3-peg oval dome patella. Patellar trial was placed and the knee was placed through a range of motion with excellent patellar tracking appreciated. The femoral trial was removed. The central post hole for the tibial component was reamed followed by insertion of a keel punch. The tibial trials were then removed. The cut surfaces of bone were irrigated with copious amounts of normal saline with antibiotic solution using pulsatile lavage and then suctioned dry. Polymethyl methacrylate cement was prepared in the usual fashion using a vacuum mixer. Cement was applied to the cut surface of the proximal tibia as well as along the undersurface of a size 4 MBT tibial component. The tibial component was positioned and impacted into place. Excess cement was removed using freer elevators. Cement was applied to the cut surface of the femur as well as along the posterior flanges of a size 5 posterior stabilized femoral component. Femoral component was positioned and impacted into place. Excess cement was removed using freer elevators. A 10 mm polyethylene trial was inserted and the knee was brought in full extension with steady axial compression applied. Finally, cement was applied to the backside of a 38 mm, 3-peg oval dome patella and the patellar component was positioned and patellar clamp applied. Excess cement was removed using freer elevators. After adequate curing of cement, the tourniquet was deflated after total tourniquet time of 71 minutes. Hemostasis was achieved using electrocautery.   The knee was irrigated with copious amounts of normal saline with antibiotic solution using pulsatile lavage and then suctioned dry. The knee was inspected for  any residual cement debris. 30 mL of 0.25% Marcaine with epinephrine was injected along the posterior capsule. A 10 mm stabilized rotating platform polyethylene insert was inserted and the knee was placed through a range of motion with excellent mediolateral soft tissue balancing appreciated and excellent patellar tracking appreciated. Two medium drains were placed in the wound bed and brought out through a separate stab incision to be attached to a reinfusion system. The medial parapatellar portion of the incision was reapproximated using interrupted sutures of #1 Vicryl. The subcutaneous tissue was approximated in layers using first #0 Vicryl followed by 2-0 Vicryl. The skin was closed with skin staples. A sterile dressing was applied. The patient tolerated the procedure well. He was transported to the recovery room in stable condition.     ____________________________ Laurice Record. Holley Bouche., MD jph:ct D: 08/03/2012 10:41:49 ET T: 08/03/2012 13:37:01 ET JOB#: [id number]  cc: Jeneen Rinks P. Holley Bouche., MD, <Dictator> JAMES P Holley Bouche MD ELECTRONICALLY SIGNED 08/09/2012 15:12

## 2014-10-07 NOTE — Discharge Summary (Signed)
PATIENT NAME:  Keith Hall, Keith Hall MR#:  [id number] DATE OF BIRTH:  April 27, 1946  DATE OF ADMISSION:  08/03/2012 DATE OF DISCHARGE: 08/06/2012  ADMITTING DIAGNOSIS: Degenerative arthrosis of the right knee.   DISCHARGE DIAGNOSIS: Degenerative arthrosis of the right knee.   HISTORY: The patient is a 69 year old white male, who has been followed at [facility] for progression of bilateral knee pain with the right being more symptomatic than the left. The patient had reported most of this pain along the medial aspect of the knee. His pain was noted to be aggravated with weight-bearing activities. On occasion, the patient had noted some swelling of the knee. He was having difficulty with going up and down stairs as well as some discomfort with getting up from a sitting position. The patient had denied any gross locking or giving way of the knee. At the time of surgery, he was not using any ambulatory aid. The patient had not seen any significant improvement in his condition despite nonsteroidal anti-inflammatory medications, glucosamine, cortisone steroid injections as well as Synvisc injections. The pain had increased to the point that it was significantly interfering with his activities of daily living. X-rays taken in the clinic showed narrowing of the medial cartilage space with associated varus alignment. He was noted to have osteophyte as well as subchondral sclerosis. After discussion of the risks and benefits of surgical intervention, the patient expressed his understanding of the risks and benefits and agreed for plans for surgical intervention.   HOSPITAL COURSE:  PROCEDURE: Right total knee arthroplasty using computer-assisted navigation.   ANESTHESIA: Femoral nerve block with spinal.   SOFT TISSUE RELEASE: Anterior cruciate ligament, posterior cruciate ligament, deep medial collateral ligaments as well as patellofemoral ligament.   IMPLANTS UTILIZED: DePuy PFC Sigma size 5 posterior stabilized  femoral component (cemented), size 4 MBT tibial component (cemented), 38 mm 3-pegged oval dome patella (cemented) and a 10 mm stabilized rotating platform polyethylene insert.   The patient tolerated the procedure very well. He had no complications. He was then taken to the PACU where he was stabilized and then transferred to the orthopedic floor. The patient began receiving anticoagulation therapy of Lovenox 30 mg subcu q. 12 hours per anesthesia and pharmacy protocol. He was fitted with TED stockings bilaterally. These were allowed to be removed 1 hour per 8 hour shift. The right one was applied on day 2 following removal of the Hemovac and dressing change. The patient was also fitted with the AV-I compression foot pumps bilaterally set at 80 mmHg. His calves have been nontender. There has been no evidence of any DVTs to the lower extremities. Heels were elevated off the bed using rolled towels.   The patient has denied any chest pain or shortness of breath. Vital signs have been stable. He has been afebrile. Hemodynamically he was stable. No transfusions were given other than the Autovac transfusions given the first 6 hours postoperatively.   Physical therapy was initiated on day 1 for gait training and transfers. The patient has done extremely well. Upon being discharged, he was ambulating greater than 200 feet. He was able to go up and down 4 sets of steps. He was independent with bed to chair transfers. The patient had greater than 90 degrees of range of motion. Occupational therapy was also initiated on day 1 for ADLs and assistive devices. He has done extremely well.   The patient is being discharged to home in improved stable condition. He may weight bear as tolerated. Continue using a  walker until cleared by physical therapy to go to a quad cane.  He will receive home health PT. He is to continue wearing TED stockings. These are to be worn during the day, but may be removed at night. Continue  Polar Care maintaining a temperature of 40 to 50 degrees Fahrenheit. He was instructed on wound care. He is to follow up in the clinic on 03/04. He is to call the clinic sooner if any temperatures of 101.5 or greater or excessive bleeding. He is to resume his regular medications that he was on prior to admission. He was given a prescription for oxycodone 5 to 10 mg every 4 to 6 hours p.r.n. for pain, Tramadol 50 to 100 mg every 4 to 6 hours p.r.n. for pain and Lovenox 40 mg subcutaneously daily for 14 days, then discontinue and begin taking one 81 mg enteric-coated aspirin.   PAST MEDICAL HISTORY:  1.  Seasonal allergies. 2. Pleurisy 3.  Depression.  4.  Kidney stones.  5.  Heart attack 15 years ago.  6.  Hernia, which was repaired. 7.  Arthritis.    ____________________________ Vance Peper, PA jrw:aw D: 08/06/2012 07:04:48 ET T: 08/06/2012 07:36:16 ET JOB#: [id number]  cc: Vance Peper, PA, <Dictator> JON WOLFE PA ELECTRONICALLY SIGNED 08/11/2012 7:33

## 2014-10-08 NOTE — Discharge Summary (Signed)
PATIENT NAME:  Keith Hall, Keith Hall MR#:  [id number] DATE OF BIRTH:  Aug 12, 1945  DATE OF ADMISSION:  07/05/2013 DATE OF DISCHARGE:  07/08/2013   DICTATING FOR: Jeneen Rinks P. Holley Bouche., MD  ADMITTING DIAGNOSIS: Degenerative arthrosis of the left knee.   DISCHARGE DIAGNOSIS: Degenerative arthrosis of the left knee.   HISTORY: The patient is a 69 year old who has been followed at [facility] for progression of bilateral knee pain. He had actually undergone a right total knee arthroplasty within the last year and has done great, but had continued to have increased pain to the left knee. The patient had reported increased left knee pain over the last several months prior to his surgery. He had localized most of the pain along the medial aspect of the knee. His pain was aggravated with prolonged weight-bearing, stair ambulation as well as arising from a sitting position. He had reported some activity-related swelling as well as near giving-way, but denied any locking of the knee. At the time of surgery, he was not using any ambulatory aid. He had not appreciated any significant improvement in his condition despite the use of anti-inflammatory medications, activity modification as well as Synvisc injections. He had continued to do his exercises on a routine basis with no improvement. The patient states that the pain had increased to the point that it was significantly interfering with his activities of daily living. X-rays taken in [facility] showed narrowing of the medial cartilage space with bone-on-bone articulation noted as well as being associated with varus alignment. There was subchondral sclerosis as well as osteophyte formation. After discussion of the risks and benefits of surgical intervention, the patient expressed his understanding of the risks and benefits and agreed for plans for surgical intervention.   PROCEDURE: Left total knee arthroplasty using computer-assisted navigation.    ANESTHESIA: Spinal.   SOFT TISSUE RELEASE: Anterior cruciate ligament, posterior cruciate ligament, deep medial collateral ligament as well as patellofemoral ligament.   IMPLANTS UTILIZED: DePuy PFC Sigma size 5 posterior stabilized femoral component (cemented), size 4 MBT tibial component (cemented), a 41 mm 3-pegged oval dome patella (cemented), and a 10 mm stabilized rotating platform polyethylene insert.   HOSPITAL COURSE: The patient tolerated the procedure very well. He had no complications. He was then taken to the PACU, where he was stabilized and then transferred to the orthopedic floor. He began receiving anticoagulation therapy of Lovenox 30 mg subcutaneous q.12 hours per anesthesia and pharmacy protocol. He was fitted with TED stockings bilaterally. These were allowed to be removed 1 hour per 8-hour shift. The left one was applied on day 2 following removal of the Hemovac and dressing change. The patient was also fitted with the AVI compression foot pumps bilaterally set at 80 mmHg. His calves have been nontender. There has been no evidence of any DVTs. Negative Homans sign. Heels were elevated off the bed using rolled towels.   The patient has denied any chest pains or shortness of breath. No evidence of any nausea during his hospital course. Vital signs have been stable. He has been afebrile. Hemodynamically, he was stable. No transfusions were given other than the Autovac transfusion given the first 6 hours postoperatively.   Physical therapy was initiated on day 1 for gait training and transfers. He has done extremely well. On day 1, he was ambulating greater than 175 feet. Upon being discharged, was ambulating greater than 250 feet. He was able go up and down 4 sets of steps. He was independent with bed  to chair transfers. Occupational therapy was also initiated on day 1 for ADLs and assistive devices.   The patient's IV, Foley and Hemovac were discontinued on day 2 along with a  dressing change. The wound was free of any drainage or any signs of infection. The Polar Care was reapplied to the surgical leg, maintaining a temperature of 40 to 50 degrees Fahrenheit.   DISPOSITION: The patient is being discharged to home in improved stable condition.   DISCHARGE INSTRUCTIONS:  1. He is to continue weight-bearing as tolerated. Continue using a rolling walker until cleared by physical therapy to go to a quad cane.  2. He will receive home health PT. 3. Elevate the lower extremities when sitting and apply ice or Polar Care, maintaining a temperature of 40 to 50 degrees Fahrenheit.  4. Continue with TED stockings bilaterally. These are allowed to be removed at night, but are to be worn during the day.  5. Elevate heels off the bed.  6. Recommend that he continue using incentive spirometer q.1 hour while awake as well as encourage cough and deep breathing q.2 hours while awake.  7. He is placed on a regular diet.  8. The dressing needs to be left on and changed by physical therapy as needed. The wound is not to get wet.  9. He is not to take any showers prior to staples being removed, for which he has an appointment on February 3rd at 8:30.  10. He is to call the clinic sooner if any temperatures of 101.5 or greater or excessive bleeding.   DRUG ALLERGIES: PENICILLIN.   MEDICATIONS: He is to resume his regular medications that he was on prior to admission. He was given a prescription for oxycodone 5 to 10 mg q.4-6 hours p.r.n. for pain, Ultram 50 to 100 mg q.4-6 hours p.r.n. for pain and Lovenox 40 mg subcutaneously daily for 14 days, then discontinue and begin taking one 81 mg enteric-coated aspirin.   PAST MEDICAL HISTORY:  1. Seasonal allergies.  2. Pleurisy.  3. Depression. 4. Kidney stones. 5. MI 15 years ago.  6. Hernia repair.  7. Arthritis.   ____________________________ Vance Peper, PA jrw:lb D: 07/08/2013 07:03:36 ET T: 07/08/2013 07:26:14  ET JOB#: [id number]  cc: Vance Peper, PA, <Dictator> JON WOLFE PA ELECTRONICALLY SIGNED 07/08/2013 19:29

## 2014-10-08 NOTE — Op Note (Signed)
PATIENT NAME:  Keith Hall, Keith Hall MR#:  [id number] DATE OF BIRTH:  1946/04/25  DATE OF PROCEDURE:  07/05/2013  PREOPERATIVE DIAGNOSIS: Degenerative arthrosis of the left knee.   POSTOPERATIVE DIAGNOSIS: Degenerative arthrosis of the left knee.   PROCEDURE PERFORMED: Left total knee arthroplasty using computer-assisted navigation.   SURGEON: Laurice Record. Hooten, M.D.   ASSISTANT:  Vance Peper, PA (required to maintain retraction throughout the procedure).   ANESTHESIA: Spinal.   ESTIMATED BLOOD LOSS: 50 mL.   FLUIDS REPLACED: 1500 mL of crystalloid.   TOURNIQUET TIME: 83 minutes.   DRAINS: Two medium drains to reinfusion system.   SOFT TISSUE RELEASES: Anterior cruciate ligament, posterior cruciate ligament, deep medial collateral ligament and patellofemoral ligament.   IMPLANTS UTILIZED:  DePuy PFC Sigma size 5 posterior stabilized femoral component (cemented), size 4 MBT tibial component (cemented), 41 mm 3 peg oval dome patella (cemented), and a 10 mm stabilized rotating platform polyethylene insert.   INDICATIONS FOR SURGERY: The patient is a 69 year old gentleman who has been seen for complaints of progressive left knee pain. X-rays demonstrated degenerative changes in tricompartmental fashion with relative varus deformity. After discussion of the risks and benefits of surgical intervention, the patient expressed understanding of the risks and benefits and agreed with plans for surgical intervention.   PROCEDURE IN DETAIL: The patient was brought to the operating room and, after adequate spinal anesthesia was achieved, a tourniquet was placed on the patient's upper left thigh. The patient's left knee and leg were cleaned and prepped with alcohol and DuraPrep, draped in the usual sterile fashion. A "timeout" was performed as per usual protocol. The left lower extremity was exsanguinated using an Esmarch, and the tourniquet was inflated to 300 mmHg. An anterior longitudinal incision was made  followed by a standard mid vastus approach. A moderate effusion was evacuated. The deep fibers of the medial collateral ligament were elevated in a subperiosteal fashion off the medial flare of the tibia so as to maintain a continuous soft tissue sleeve. The patella was subluxed laterally and the patellofemoral ligament was incised. Inspection of the knee demonstrated severe degenerative changes with full-thickness loss of articular cartilage to the medial compartment. Prominent osteophytes were debrided using a rongeur. Anterior and posterior cruciate ligaments were excised. Two 4.0 mm Schanz pins were inserted into the femur and into the tibia for attachment of the ray of trackers used for computer-assisted navigation. Hip center was identified using circumduction technique. Distal landmarks were mapped using the computer. The distal femur and proximal tibia were mapped using the computer. Distal femoral cutting guide was positioned using computer-assisted navigation so as to achieve a 5 degrees distal valgus cut. Cut was performed and verified using the computer. Distal femur was sized, and it was felt that a size 5 femoral component was appropriate. A size 5 cutting guide was positioned and anterior cut was performed and verified using the computer. This was followed by completion of the posterior and chamfer cuts. Femoral cutting guide for the central box was then positioned and the central box cut was performed. Attention was then directed to the proximal tibia. Medial and lateral menisci were excised. The extramedullary tibial cutting guide was positioned using computer-assisted navigation so as to achieve 0 degrees varus valgus alignment and 0 degrees posterior slope. Cut was performed and verified using the computer. The proximal tibia was sized and it was felt that a size 4 tibial tray was appropriate. Tibial and femoral trials were inserted followed by insertion of a 10 mm  polyethylene trial. Excellent  mediolateral soft tissue balancing was appreciated both in extension and in flexion. Finally, the patella was cut and prepared so as to accommodate a 41 mm three peg oval dome patella. Patellar trial was placed and the knee was placed through a range of motion with excellent patellar tracking appreciated.   Femoral trial was removed. Central post hole for the tibial component was reamed followed by insertion of a keel punch. Tibial trial was then removed. The cut surfaces of bone were irrigated with copious amounts of normal saline with antibiotic solution using pulsatile lavage and then suctioned dry. Polymethyl methacrylate cement was prepared in the usual fashion using a vacuum mixer. Cement was applied to the cut surface of the proximal tibia as well as along the undersurface of the size 4 MBT tibial component. The tibial component was positioned and impacted into place. Excess cement was removed using freer elevators. Cement was then applied to the cut surface of the femur as well as on the posterior flanges of a size 5 posterior stabilized femoral component. Femoral component was positioned and impacted into place. Excess cement was removed using freer elevators. A 10 mm polyethylene trial was inserted and the knee was brought in full extension with steady axial compression applied. Finally, cement was applied to the backside of a 41 mm 3 peg oval dome patella and the patellar component was positioned and patellar clamp applied. Excess cement was removed using freer elevators.   After adequate curing of cement, the tourniquet was deflated after total tourniquet time of 83 minutes. Hemostasis was achieved using electrocautery. The knee was irrigated with copious amounts of normal saline with antibiotic solution using pulsatile lavage and then suctioned dry. The knee was inspected for any residual cement debris. 20 mL of 1.3% Exparel in 40 mL of normal saline was injected along the posterior capsule, medial  and lateral gutters, and along the arthrotomy site. A 10 mm stabilized rotating platform polyethylene insert was inserted and the knee was placed through a range of motion with excellent mediolateral soft tissue balance appreciated and excellent patellar tracking noted. Two medium drains were placed in the wound bed and brought out through a separate stab incision to be attached to a reinfusion system. The medial parapatellar portion of the incision was reapproximated using interrupted sutures of 1 Vicryl. The subcutaneous tissue was approximated in layers using first 0 Vicryl followed by 2-0 Vicryl. Skin was closed with skin staples after injecting additional 30 mL of 0.25% Marcaine with epinephrine along the incision site in the subcutaneous tissue. A sterile dressing was applied.   The patient tolerated the procedure well. He was transported to the recovery room in stable condition.    ____________________________ Laurice Record. Holley Bouche., MD jph:dp D: 07/06/2013 06:14:35 ET T: 07/06/2013 10:30:50 ET JOB#: [id number]  cc: Jeneen Rinks P. Holley Bouche., MD, <Dictator> Laurice Record Holley Bouche MD ELECTRONICALLY SIGNED 08/06/2013 6:36

## 2014-10-08 NOTE — Op Note (Signed)
PATIENT NAME:  Keith Hall, Keith Hall MR#:  [id number] DATE OF BIRTH:  1946-01-18  DATE OF PROCEDURE:  04/25/2014  PREOPERATIVE DIAGNOSIS: Cataract, left eye.   POSTOPERATIVE DIAGNOSIS: Cataract, left eye.   PROCEDURE PERFORMED: Extracapsular cataract extraction using phacoemulsification with placement of an Alcon SN6CWS 24.0-diopter posterior chamber lens, serial number [id number].   SURGEON: Loura Back. Jay Haskew, M.D.   ANESTHESIA: Lidocaine 4% and 0.75% Marcaine, a 50-50 mixture with 10 units/mL of Hylenex added, given as a peribulbar.   ANESTHESIOLOGIST: Dr. [place] Service.   COMPLICATIONS: None.   ESTIMATED BLOOD LOSS: Less than 1 mL.   DESCRIPTION OF PROCEDURE: The patient was brought to the operating room and given a peribulbar block.  The patient was then prepped and draped in the usual fashion.  The vertical rectus muscles were imbricated using 5-0 silk sutures.  These sutures were then clamped to the sterile drapes as bridle sutures.  A limbal peritomy was performed extending two clock hours and hemostasis was obtained with cautery.  A partial thickness scleral groove was made at the surgical limbus and dissected anteriorly in a lamellar dissection using an Alcon crescent knife.  The anterior chamber was entered supero-temporally with a Superblade and through the lamellar dissection with a 2.6 mm keratome.  DisCoVisc was used to replace the aqueous and a continuous tear capsulorrhexis was carried out.  Hydrodissection and hydrodelineation were carried out with balanced salt and a 27 gauge canula.  The nucleus was rotated to confirm the effectiveness of the hydrodissection.  Phacoemulsification was carried out using a divide-and-conquer technique.  Total ultrasound time was 1 minute and 35 seconds with an average power of 24.2 percent. CDE of 42.93.  No suture was placed.  Irrigation/aspiration was used to remove the residual cortex.  DisCoVisc was used to inflate the capsule and the  internal incision was enlarged to 3 mm with the crescent knife.  The intraocular lens was folded and inserted into the capsular bag using the AcrySert delivery system.  Irrigation/aspiration was used to remove the residual DisCoVisc.  Miostat was injected into the anterior chamber through the paracentesis track to inflate the anterior chamber and induce miosis and 0.1 mL of Vigamox containing 0.1 mg of drug was injected via the paracentesis tract. The wound was checked for leaks and none were found. The conjunctiva was closed with cautery and the bridle sutures were removed.  Two drops of 0.3% Vigamox were placed on the eye.   An eye shield was placed on the eye.  The patient was discharged to the recovery room in good condition.   ____________________________ Loura Back Tyreque Finken, MD sad:JT D: 04/25/2014 13:20:32 ET T: 04/25/2014 14:36:08 ET JOB#: [id number]  cc: Remo Lipps A. Dujuan Stankowski, MD, <Dictator> Martie Lee MD ELECTRONICALLY SIGNED 05/09/2014 13:32

## 2014-10-08 NOTE — Op Note (Signed)
PATIENT NAME:  Keith Hall, Keith Hall MR#:  [id number] DATE OF BIRTH:  1945-09-13  DATE OF PROCEDURE:  04/04/2014  PREOPERATIVE DIAGNOSIS: Cataract, right eye.   POSTOPERATIVE DIAGNOSIS:  Cataract, right eye.  PROCEDURE PERFORMED: Extracapsular cataract extraction using phacoemulsification with placement of Alcon SN6CWS, 25.5 diopter posterior chamber lens, serial number [id number].   SURGEON: Loura Back. Merrell Rettinger, M.D.   ANESTHESIA: 4% lidocaine and 0.75% Marcaine, a 50-50 mixture with 10 units per mL of Hylenex added, given as a peribulbar.   ANESTHESIOLOGIST:  Dr. Benjamine Mola.   COMPLICATIONS: None.   ESTIMATED BLOOD LOSS: Less than 1 mL.    DESCRIPTION OF PROCEDURE:  The patient was brought to the operating room and given a peribulbar block.  The patient was then prepped and draped in the usual fashion.  The vertical rectus muscles were imbricated using 5-0 silk sutures.  These sutures were then clamped to the sterile drapes as bridle sutures.  A limbal peritomy was performed extending two clock hours and hemostasis was obtained with cautery.  A partial thickness scleral groove was made at the surgical limbus and dissected anteriorly in a lamellar dissection using an Alcon crescent knife.  The anterior chamber was entered superonasally with a Superblade and through the lamellar dissection with a 2.6 mm keratome.  DisCoVisc was used to replace the aqueous and a continuous tear capsulorrhexis was carried out.  Hydrodissection and hydrodelineation were carried out with balanced salt and a 27 gauge canula.  The nucleus was rotated to confirm the effectiveness of the hydrodissection.  Phacoemulsification was carried out using a divide-and-conquer technique.  Total ultrasound time was 1 minute 13 seconds with an average power of 22.4%, a CDE of 28.8, no sutures was placed. .  Irrigation/aspiration was used to remove the residual cortex.  DisCoVisc was used to inflate the capsule and the internal incision  was enlarged to 3 mm with the crescent knife.  The intraocular lens was folded and inserted into the capsular bag using the AcrySert delivery system.  Irrigation/aspiration was used to remove the residual DisCoVisc.  Miostat was injected into the anterior chamber through the paracentesis track to inflate the anterior chamber and induce miosis.  A 0.1 mg of Vigamox containing 0.1 mg of drug was injected via the paracentesis track. The wound was checked for leaks and none were found. The conjunctiva was closed with cautery and the bridle sutures were removed.  Two drops of 0.3% Vigamox were placed on the eye.   An eye shield was placed on the eye.  The patient was discharged to the recovery room in good condition.      ____________________________ Loura Back Cuma Polyakov, MD sad:DT D: 04/04/2014 13:17:33 ET T: 04/04/2014 15:40:51 ET JOB#: [id number]  cc: Remo Lipps A. Brisha Mccabe, MD, <Dictator> Martie Lee MD ELECTRONICALLY SIGNED 04/11/2014 12:38

## 2014-10-16 NOTE — Discharge Summary (Signed)
PATIENT NAME:  Keith Hall, Keith Hall MR#:  [id number] DATE OF BIRTH:  14-Feb-1946  DATE OF ADMISSION:  07/05/2014 DATE OF DISCHARGE:    DISCHARGE DIAGNOSIS:  1. Acute ischemic cerebral vascular accident. 2. History of hemorrhagic cerebrovascular accident recently.  3. Coronary artery disease.   CONDITION: Stable.   CODE STATUS: Full code.   HOME MEDICATIONS: Please refer to the medication reconciliation list.   DIET: Low-sodium, low-fat, low-cholesterol diet.   ACTIVITY: As tolerated.   FOLLOWUP CARE: Follow with PCP within 1-2 weeks.   REASON FOR ADMISSION: Left arm numbness and weakness.   HOSPITAL COURSE: The patient is a 69 year old Caucasian male with a history of CAD, recent thalamic hemorrhagic CVA, who presented to the ED from nursing home due to left arm weakness and numbness. For a detailed history and physical examination, please refer to the admission note dictated by Dr. Lavetta Nielsen on admission date. The patient's laboratory data was unremarkable. CAT scan of head reveals a recent right thalamic hemorrhage, but no acute hemorrhage or new infarct. After admission, the patient has been treated with aspirin 325 mg p.o. daily. An MRI of the brain showed acute infarct on the right side. Dr. Irish Elders evaluated the patient and suggested continue aspirin 325 mg p.o. daily with a statin and to continue physical therapy. The patient got a carotid duplex, which is negative for stenosis.  2. For coronary artery disease, the patient has been treated with aspirin and statin.  3. The patient still has some numbness and weakness on the left side, which is his baseline. His vital signs are stable. He is clinically stable. He will be discharged to subacute rehab today. I discussed the patient's discharge plan with the patient, the patient's wife, nurse, and Education officer, museum.   TIME SPENT: About 42 minutes.   ____________________________ Demetrios Loll, MD qc:mw D: 07/06/2014 10:56:07 ET T: 07/06/2014  11:06:45 ET JOB#: [id number]  cc: Demetrios Loll, MD, <Dictator> Demetrios Loll MD ELECTRONICALLY SIGNED 07/06/2014 16:55

## 2014-10-16 NOTE — Consult Note (Signed)
PATIENT NAME:  Keith Hall, Keith Hall MR#:  [id number] DATE OF BIRTH:  1946-04-21  DATE OF CONSULTATION:  07/05/2014  REFERRING PHYSICIAN:   CONSULTING PHYSICIAN:  Leotis Pain, MD  REASON FOR CONSULTATION: Left upper extremity numbness.  HISTORY OF PRESENT ILLNESS: A 69 year old Caucasian male with past medical history of coronary artery disease, status post CABG, recent history of right thalamic hemorrhage, status post discharge from [facility] about a week to week and a half ago with difficulty with dexterity in the left upper extremity that has now improved, comes in with 1 day history of numbness on the left deltoid, biceps extending to the distal fingers and the left arm. Looking over the images around the thalamus, the patient has likely new infarct on posterior tip of left corona radiata with small vessel disease.   REVIEW OF SYSTEMS: GENERAL: Denies any fever, chills. Positive for fatigue.  CONSTITUTIONAL: Denies blurry vision, double vision. Denies tinnitus or ear pain.  RESPIRATORY: Denies cough or wheeze.  CARDIOVASCULAR: Denies chest pain.  GASTROINTESTINAL: Denies nausea.  GENITOURINARY: Denies dysuria or hematuria.  HEMATOLOGIC: Denies bruising.  NEUROLOGICAL: Positive numbness on the left side.  PSYCHIATRIC: Denies any anxiety or depressive symptoms.   SOCIAL HISTORY: No tobacco, alcohol use.   FAMILY HISTORY: Denies any cardiovascular symptoms.   HOME MEDICATIONS: Have been reviewed.   ALLERGIES: INCLUDE PENICILLIN.   LABORATORY DATA: Workup has been reviewed.   NEUROLOGIC EVALUATION: The patient is awake, alert, oriented to time, place, location and the reason why he is in the hospital. Facial sensation intact. Facial motor is intact. Speech appears to be fluent. Tongue is midline. Uvula elevates symmetrically. Shoulder shrug intact. Motor strength appears to be 5/5 bilateral upper and lower extremities with decreased sensation in the left upper extremity and the patient has  slight decrease in grip on the left upper extremity, which is from the previous stroke. Coordination: Finger-to-nose intact. Reflexes symmetrical bilaterally. Gait: Not assessed. Coordination intact.   IMPRESSION: A 69 year old gentleman with history of coronary artery disease, coronary artery bypass grafting, recent right thalamic hemorrhage. On MRI, the patient likely has a right corona radiata stroke. When looking at that stroke, there is a possibility that the patient had that right corona radiata stroke and he bled into the thalamus which is close to it. Suspect similar vessel distribution.   PLAN: Agree with increasing aspirin to full dose. The patient was on aspirin 81 prior to that. Physical therapy, occupational therapy, which has seen the patient. I think the patient can be discharged from a neurological perspective. Again, I think this is likely similar vessel distribution from the prior stroke. Follow up with neurology as an outpatient. I discussed this case the patient and the patient's family at bedside.   Thank you. Please call me with any questions.    ____________________________ Leotis Pain, MD yz:TT D: 07/05/2014 14:18:20 ET T: 07/05/2014 14:54:05 ET JOB#: [id number]  cc: Leotis Pain, MD, <Dictator> Leotis Pain MD ELECTRONICALLY SIGNED 07/05/2014 21:11

## 2014-10-16 NOTE — H&P (Signed)
PATIENT NAME:  Keith Hall, Keith Hall MR#:  [id number] DATE OF BIRTH:  1946-03-19  DATE OF ADMISSION:  07/05/2014  REFERRING PHYSICIAN: Loney Hering, MD  PRIMARY CARE PHYSICIAN: Kirstie Peri. Fisher, MD   CHIEF COMPLAINT: Left arm weakness.  HISTORY OF PRESENT ILLNESS: A 69 year old Caucasian gentleman with a history of coronary artery disease, status post CABG, recent right thalamic hemorrhagic stroke presenting with left arm weakness. He was just discharged from [facility] for hemorrhagic CVA about 1 week ago. He had improvement of his initial left-sided symptoms but not quite back at his baseline; however, today noticed acute onset of left arm numbness and weakness. Describes no further symptoms, including headache, chest pain, fevers, chills, slurred speech, or further weakness. He had some improvement of his symptoms by the time he arrived to the Emergency Department; however, still present.   REVIEW OF SYSTEMS:  CONSTITUTIONAL: Denies fevers, chills. Positive for fatigue, weakness.  EYES: Denies blurred vision, double vision, or eye pain.  EARS, NOSE, THROAT: Denies tinnitus or ear pain. He is hard of hearing.  RESPIRATORY: Denies cough, wheeze, shortness of breath.  CARDIOVASCULAR: Denies chest pain, palpitations, edema.  GASTROINTESTINAL: Denies nausea, vomiting, diarrhea, or abdominal pain.  GENITOURINARY: Denies dysuria or hematuria.  ENDOCRINE: Denies nocturia or thyroid problems.  HEMATOLOGIC/LYMPHATIC: Denies easy bruising or bleeding.  SKIN: Denies rash or lesions. MUSCULOSKELETAL: Denies pain in neck, back, shoulder, knees, hips, or arthritic symptoms.  NEUROLOGIC: Positive for numbness in the left arm, as well as weakness. Denies any dysarthria or ataxia.  PSYCHIATRIC: Denies anxiety or depressive symptoms.   Otherwise full review of systems performed by me is negative.   PAST MEDICAL HISTORY: Coronary artery disease, status post CABG, as well as right thalamic hemorrhagic stroke  which occurred on 05/28/2014.   SOCIAL HISTORY: No tobacco. Occasional alcohol. No drug use.   FAMILY HISTORY: Denies any known cardiovascular or pulmonary disorders.   ALLERGIES: PENICILLIN.   HOME MEDICATIONS: Include acetaminophen 500 mg p.o. q. 8 hours as needed for pain and fever, aspirin 81 mg 2 tablets p.o. daily, clonazepam 0.5 mg 1/2 tablet p.o. daily, gabapentin 100 mg p.o. b.i.d., as well as gabapentin 300 mg p.o. at bedtime, citalopram 10 mg p.o. daily, trazodone 50 mg p.o. at bedtime as needed for sleep, atorvastatin 40 mg p.o. at bedtime, Coreg 3.125 mg p.o. b.i.d., Lidoderm 5% topical film to affected area b.i.d., Colace 100 mg p.o. b.i.d., MiraLax daily as needed for constipation, senna 8.6 mg 2 tablets p.o. daily, Flexeril 5 mg p.o. b.i.d., glucosamine/chondroitin 2 tabs p.o. daily, multivitamin 1 tab p.o. daily.   PHYSICAL EXAMINATION:  VITAL SIGNS: Temperature 97.7, heart rate 64, respirations 18, blood pressure 125/75, saturating 97% on room air. Weight 88.4 kg, BMI 29.6.  GENERAL: Well-nourished well-developed Caucasian gentleman currently in no acute distress.  HEAD: Normocephalic, atraumatic.  EYES: Pupils equal, round, reactive to light. Extraocular muscles are intact. There is no scleral icterus.  MOUTH: Moist mucous membranes. Dentition intact. No abscess noted.  EAR, NOSE, THROAT: Clear without exudates. No external lesions.  NECK: Supple. No thyromegaly. No nodules. No JVD.  PULMONARY: Clear to auscultation bilaterally without wheezes, rales, rhonchi. No use of accessory muscles. Good respiratory effort.  CHEST: Nontender to palpation.  CARDIOVASCULAR: S1, S2, regular rate and rhythm with a 3/6 systolic ejection murmur best heard at the left upper sternal border. No edema. Pedal pulses 2+ bilaterally.  GASTROINTESTINAL: Soft, nontender, nondistended. No masses. Positive bowel sounds. No hepatosplenomegaly.  MUSCULOSKELETAL: No swelling, clubbing, or  edema. Range of  motion full in all extremities.  NEUROLOGIC: Cranial nerves II through XII grossly intact; however, does have decreased sensation to light touch over the left face to all branches of trigeminal nerve. He does have decreased sensation of the left face. Strength: Right side 5/5 in both proximal and distal flexion and extension. Left upper extremity 4/5 in proximal flexion and extension. Hand grip 4-/5. Pronator drift is within normal limits. Gait deferred at this time. Lower extremities is 5/5 in both proximal and distal flexion and extension. Reflexes intact.  SKIN: No ulceration, lesions, rashes, or cyanosis. Skin warm, dry. Turgor intact.  PSYCHIATRIC: Mood and affect within normal limits. The patient is awake, alert, oriented x 3. He is hard of hearing. Insight and judgment intact.   LABORATORY DATA: Sodium 142, potassium 3.5, chloride 106, bicarbonate 31, BUN 14, creatinine 0.95, glucose 103. LFTs within normal limits. WBC 4.7, hemoglobin 14.3, platelets of 180,000. Urinalysis within normal limits. CT head performed which reveals sequela of recent right thalamic hemorrhage. No acute hemorrhage or new infarct identified.   ASSESSMENT AND PLAN: A 69 year old Caucasian gentleman with history of coronary artery disease, status post coronary artery bypass grafting, as well as recent right thalamic hemorrhagic cerebrovascular accident presenting with left arm weakness. 1.  Cerebrovascular accident, unspecified mechanism: Place on telemetry. Initiate aspirin and statin therapy. We will increase his aspirin to full dose. We will get an MRI of the brain,  transthoracic echocardiogram, carotid Dopplers, lipid panel. If the MRI is positive for new findings we will get a neurology consult. Perform neurological checks every 4 hours.  2.  Coronary artery disease, status post coronary artery bypass graft: Continue aspirin and statin therapy.  3.  Venous thromboembolism prophylaxis with sequential compression devices.    CODE STATUS: The patient is FULL CODE.   TIME SPENT: 45 minutes.    ____________________________ Aaron Mose. Romero Letizia, MD dkh:ts D: 07/05/2014 01:23:03 ET T: 07/05/2014 01:36:39 ET JOB#: [id number]  cc: Aaron Mose. Shawne Bulow, MD, <Dictator> Martise Waddell Woodfin Ganja MD ELECTRONICALLY SIGNED 07/06/2014 3:20

## 2014-10-17 ENCOUNTER — Encounter: Payer: Self-pay | Admitting: Occupational Therapy

## 2014-10-17 ENCOUNTER — Ambulatory Visit: Payer: PPO | Attending: Internal Medicine | Admitting: Physical Therapy

## 2014-10-17 ENCOUNTER — Ambulatory Visit: Payer: PPO | Admitting: Occupational Therapy

## 2014-10-17 ENCOUNTER — Encounter: Payer: Self-pay | Admitting: Physical Therapy

## 2014-10-17 DIAGNOSIS — M6281 Muscle weakness (generalized): Secondary | ICD-10-CM | POA: Diagnosis not present

## 2014-10-17 DIAGNOSIS — R2689 Other abnormalities of gait and mobility: Secondary | ICD-10-CM | POA: Insufficient documentation

## 2014-10-17 DIAGNOSIS — R279 Unspecified lack of coordination: Secondary | ICD-10-CM | POA: Insufficient documentation

## 2014-10-17 DIAGNOSIS — R269 Unspecified abnormalities of gait and mobility: Secondary | ICD-10-CM

## 2014-10-17 DIAGNOSIS — I69398 Other sequelae of cerebral infarction: Secondary | ICD-10-CM | POA: Insufficient documentation

## 2014-10-17 DIAGNOSIS — M6289 Other specified disorders of muscle: Secondary | ICD-10-CM

## 2014-10-17 DIAGNOSIS — R262 Difficulty in walking, not elsewhere classified: Secondary | ICD-10-CM | POA: Insufficient documentation

## 2014-10-17 NOTE — Therapy (Signed)
[place] MAIN [facility] SERVICES [address] [place], [place], [address] Phone: [phone]   Fax:  [phone]  Occupational Therapy Treatment  Patient Details  Name: Keith Hall MRN: [id number] Date of Birth: 1946-02-16 Referring Provider:  Venia Carbon, MD  Encounter Date: 10/17/2014      OT End of Session - 10/17/14 1450    Visit Number 18   Number of Visits 24   Date for OT Re-Evaluation 11/07/14   Authorization Type Medicare G codes   Authorization Time Period 7 of 10   OT Start Time 1301   OT Stop Time 1345   OT Time Calculation (min) 44 min   Activity Tolerance Patient tolerated treatment well   Behavior During Therapy [facility] for tasks assessed/performed      Past Medical History  Diagnosis Date  . Osteoarthritis of both knees   . Depression 1980's04/12/04    MDD---even needed hospitalization  . Kidney stone 1980 only  . Sudden cardiac death 1999-09-27    no clear etiology of this  . BPH (benign prostatic hypertrophy)   . Intraparenchymal hemorrhage of brain 12/15    Right basal ganglia--Rx at [facility] and rehab    Past Surgical History  Procedure Laterality Date  . Cardiovascular stress test  07/09/11    Negative--Dr Paraschos  . Cardiac catheterization  last in 27-Sep-2007    insignificant coronary disease  . Hernia repair  September 27, 2010    left inguinal--Dr Byrnett  . Rotator cuff repair  09-27-07    both done by Dr Margaretmary Eddy  . 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 Total knee arthroplasty Left 1/15    Dr [facility]    There were no vitals filed for this visit.  Visit Diagnosis:  Coordination disorder  Muscular tone excessive      Subjective Assessment - 10/17/14 1446    Subjective  My hand is slow.   Repetition Decreases Symptoms  After mobilization and stretching             [facility] OT Assessment - 10/17/14 0001    Assessment   Diagnosis hemorrhagic stroke   Precautions   Precautions Fall                   OT Treatments/Exercises (OP) - 10/17/14 0001    Neurological Re-education Exercises   Other Exercises 1 Fine motor activieites using color clips (yellow easiest ) card fkipping, velcro checkers, and Judy instructo beg board.   Manual Therapy   Joint Mobilization Mobilized shoulder to decrease tone.   Passive ROM Passive stretching in reflex inhibiting pattern to left upper extremity through out session.                OT Education - 10/17/14 1449    Education provided Yes   Person(s) Educated Patient   Methods Explanation   Comprehension Verbalized understanding             OT Long Term Goals - 10/17/14 1501    OT LONG TERM GOAL #1   Title Patient will improve 9 hole peg test to 100 seconds to be able to button buttons.by 11/07/2014   Status On-going   OT LONG TERM GOAL #2   Title Patient will improve left upper extremity dexterity to be able to use his computer by5/23/2016   Status On-going               Plan - 10/17/14  1456    Clinical Impression Statement Patient did well today and responded well to mobilization and stretching to normalize tone.   Pt will benefit from skilled therapeutic intervention in order to improve on the following deficits (Retired) Decreased range of motion;Impaired flexibility;Impaired tone;Impaired UE functional use   Rehab Potential Good   OT Frequency 2x / week   OT Duration Other (comment)  3 more week   OT Treatment/Interventions Self-care/ADL training;Neuromuscular education   Plan Continue with left ue fine motor, grasp release and mobilization and stretching to normalize tone.   Consulted and Agree with Plan of Care Patient        Problem List Patient Active Problem List   Diagnosis Date Noted  . 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 Fatigue 08/23/2014  . Frequent urination 08/23/2014  . Depression due to stroke 07/27/2014  .  Intraparenchymal hemorrhage of brain   . Advanced directives, counseling/discussion 02/07/2014  . Routine general medical examination at a health care facility 01/17/2013  . Undiagnosed cardiac murmurs 02/01/2013  . Osteoarthritis of both knees   . MDD (recurrent major depressive disorder) in remission   . Sudden cardiac death   . BPH (benign prostatic hypertrophy)     Sharon Mt, MS/OTR/L   10/17/2014, 3:24 PM  [place] MAIN [facility] SERVICES [address] [place], [place], [address] Phone: [phone]   Fax:  [phone]

## 2014-10-17 NOTE — Therapy (Signed)
[place] MAIN [facility] SERVICES [address] [place], [place], [address] Phone: [phone]   Fax:  [phone]  Physical Therapy Treatment  Patient Details  Name: Keith Hall MRN: [id number] Date of Birth: May 02, 1946 Referring Provider:  Venia Carbon, MD  Encounter Date: 10/17/2014      PT End of Session - 10/17/14 1546    Visit Number 20   Number of Visits 31      Past Medical History  Diagnosis Date  . Osteoarthritis of both knees   . Depression 1980's2004-04-02    MDD---even needed hospitalization  . Kidney stone 1980 only  . Sudden cardiac death 1999-09-17    no clear etiology of this  . BPH (benign prostatic hypertrophy)   . 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 and rehab    Past Surgical History  Procedure Laterality Date  . Cardiovascular stress test  07/09/11    Negative--Dr Paraschos  . Cardiac catheterization  last in 17-Sep-2007    insignificant coronary disease  . Hernia repair  17-Sep-2010    left inguinal--Dr Byrnett  . Rotator cuff repair  09/17/07    both done by Dr Margaretmary Eddy  . 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 Total knee arthroplasty Left 1/15    Dr [facility]    There were no vitals filed for this visit.  Visit Diagnosis:  Abnormality of gait  Difficulty in walking      Subjective Assessment - 10/17/14 1402    Subjective Pt reports that he was walking with his cane and his left knee "went out", which has happened several times. Pt reports he can typically catch himself, but was unable to on this occassion.    Patient Stated Goals Pt would like to be more confident with his ambulation skills, and not feel as though he may fall. Additionally pt would like to reduce his LLE pain at this time.    Currently in Pain? Yes   Pain Score 3    Pain Location Knee   Pain Orientation Posterior;Left   Pain Descriptors / Indicators Aching   Pain Type Other (Comment)   Subacute pain ~ 2 weeks    Pain Radiating Towards Distal medial hamstrings and distal adductors on LLE.    Pain Onset 1 to 4 weeks ago   Aggravating Factors  Sit <--> stand.and "long" reciprocal stepping.    Pain Relieving Factors Soft tissue mobilization and heat.             [facility] PT Assessment - 10/17/14 1445    Assessment   Medical Diagnosis CVA   Onset Date 05/28/09   Precautions   Precautions Fall   Balance Screen   Has the patient fallen in the past 6 months Yes   How many times? 1   Has the patient had a decrease in activity level because of a fear of falling?  Yes   Is the patient reluctant to leave their home because of a fear of falling?  No   Single Leg Stance   Comments RLE > 10 seconds. LLE unable to balance for any significant period of time.    Sit to Stand   Comments 5 x sit to stand time 19.7 seconds   Flexibility   Hamstrings 90-90 test length limited 30-40 degrees, no pain on LLE   Palpation   Palpation Pain and soft tissue restrictions on LLE adductors.  Static Standing Balance   Static Standing - Balance Support No upper extremity supported  > 30 seconds      Manual Therapy:  Soft tissue mobilization provided to distal adductors and distal medial hamstrings tendon --> well tolerated.  Manual stretching of LLE adductors in supine x 30 seconds PNF stretching x 10 repetitions with 3 second holds of left hamstrings in supine.   There-Ex  Sit <--> stand training x 10 with unilateral hand support, cuing for anterior trunk displacement and wider stance.  5x sit to stand (19.7 seconds) Standing balance eyes closed > 30 seconds  RLE single leg stance 11 seconds, LLE 0 seconds.  Lateral step ups to blue foam x 12 with LLE on foam, x 2 sets. Pt required cuing for motor planning and technique. Advanced from hand on rail to no hands.  Standing knee flexion with red band resistance 1 x 12 bilaterally cuing for lifting foot towards buttock.  Standing hip  abduction x 12 bilaterally with single hand assistance.  Step ups to 6" box with LLE on box x 12 for 2 sets with cuing for minimizing valgus and going from 2 hand support to 2 finger support on one hand.  Side stepping x 10' for 2 sets bilaterally. Cuing for increased knee flexion.                        PT Education - 10/17/14 1511    Education provided Yes   Education Details Instructed pt in exercise technique.    Person(s) Educated Patient   Methods Explanation;Demonstration;Verbal cues   Comprehension Returned demonstration;Verbalized understanding             PT Long Term Goals - 10/17/14 1516    PT LONG TERM GOAL #1   Title Patient will be able to stand with eyes closed for >30 seconds for decreased risk for fall by 6/13//2016   Status New   PT LONG TERM GOAL #2   Title Patient (>43 years old) will complete five times sit to stand test in < 14.2 seconds indicating a decreased likelihood of a balance dysfunction and improved LE strength by 11/28/2014   Status New   PT LONG TERM GOAL #3   Title Patient will be independent with ascent and descent of 13 steps using single UE in step over step pattern without LOB by 11/28/2014.   Status New   PT LONG TERM GOAL #4   Title Patient will improve gait speed to > 1.2 m/s with least restrictive assistive device for safety with indpendent community ambulation by 11/28/2014.   Status New   PT LONG TERM GOAL #5   Title Patient will be able to ambulate > 600 feet with least restrictive assistive device for safety with indpendent community ambulation by 11/28/2014.   Status New   Additional Long Term Goals   Additional Long Term Goals Yes   PT LONG TERM GOAL #6   Title Patient will be able to stand on single leg x 10 seconds to indicate improved standing balance by 11/28/2014.   Status New   PT LONG TERM GOAL #7   Title Patient will ambulate on an incline ascending and descending independently for 100 feet with spc by  11/28/2014.   Status New               Plan - 10/17/14 1559    Clinical Impression Statement Pt displays tightness around distal medial hamstring tendon and adductors on  LLE, leading him to have pain symptoms around the knee. This was addressed with stretching, soft tissue mobilization, and strengthening/motor control actities. Pt reported he left the facility with "1.5/10" on his pain, indicating a 50% reduction in symptoms. Pt is very weak in his left posterolateral hip musculature, impairing his balance and leading to compensations in his distal posterior thigh. Pt would benefit from continued strength and balance activities on LLE.    Pt will benefit from skilled therapeutic intervention in order to improve on the following deficits Abnormal gait;Decreased balance;Decreased strength;Pain;Decreased endurance;Difficulty walking   Rehab Potential Good   PT Frequency 2x / week   PT Duration 8 weeks   PT Treatment/Interventions Manual techniques;Gait training;Ultrasound;Therapeutic exercise   PT Next Visit Plan Progress with HEP, strength and balance activities, and pain relief techniques for LLE.    PT Home Exercise Plan Pt encouraged to continue with HEP, which he had stopped since his fall secondary to pain.    Consulted and Agree with Plan of Care Patient        Problem List Patient Active Problem List   Diagnosis Date Noted  . 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 Fatigue 08/23/2014  . Frequent urination 08/23/2014  . Depression due to stroke 07/27/2014  . Intraparenchymal hemorrhage of brain   . Advanced directives, counseling/discussion 02/07/2014  . Routine general medical examination at a health care facility 02/01/2013  . Undiagnosed cardiac murmurs 01/17/2013  . Osteoarthritis of both knees   . MDD (recurrent major depressive disorder) in remission   . Sudden cardiac death   . BPH (benign prostatic  hypertrophy)    Kerman Passey, PT, DPT   [facility] MAIN [facility] SERVICES [address] [place], [place], [address] Phone: [phone]   Fax:  [phone]

## 2014-10-19 ENCOUNTER — Ambulatory Visit: Payer: PPO

## 2014-10-19 ENCOUNTER — Ambulatory Visit: Payer: PPO | Admitting: Occupational Therapy

## 2014-10-19 ENCOUNTER — Encounter: Payer: Self-pay | Admitting: Occupational Therapy

## 2014-10-19 VITALS — BP 137/72 | HR 73

## 2014-10-19 DIAGNOSIS — I69398 Other sequelae of cerebral infarction: Secondary | ICD-10-CM | POA: Diagnosis not present

## 2014-10-19 DIAGNOSIS — R269 Unspecified abnormalities of gait and mobility: Secondary | ICD-10-CM

## 2014-10-19 DIAGNOSIS — R279 Unspecified lack of coordination: Secondary | ICD-10-CM

## 2014-10-19 DIAGNOSIS — M6289 Other specified disorders of muscle: Secondary | ICD-10-CM

## 2014-10-19 DIAGNOSIS — R262 Difficulty in walking, not elsewhere classified: Secondary | ICD-10-CM

## 2014-10-19 NOTE — Therapy (Signed)
[place] MAIN [facility] SERVICES [address] [place], [place], [address] Phone: [phone]   Fax:  [phone]  Occupational Therapy Treatment  Patient Details  Name: Keith Hall MRN: [id number] Date of Birth: 11/09/1945 Referring Provider:  Venia Carbon, MD  Encounter Date: 10/19/2014      OT End of Session - 10/19/14 1640    Visit Number 19   Number of Visits 24   Date for OT Re-Evaluation 11/07/14   Authorization Type Medicare G codes   Authorization - Visit Number 8   OT Start Time 1062   OT Stop Time 1634   OT Time Calculation (min) 50 min   Activity Tolerance Patient tolerated treatment well   Behavior During Therapy [facility] for tasks assessed/performed      Past Medical History  Diagnosis Date  . Osteoarthritis of both knees   . Depression 1980's2004/04/17    MDD---even needed hospitalization  . Kidney stone 1980 only  . Sudden cardiac death Oct 02, 1999    no clear etiology of this  . BPH (benign prostatic hypertrophy)   . 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 Cardiovascular stress test  07/09/11    Negative--Dr Paraschos  . Cardiac catheterization  last in October 02, 2007    insignificant coronary disease  . Hernia repair  10-02-2010    left inguinal--Dr Byrnett  . Rotator cuff repair  10-02-2007    both done by Dr Margaretmary Eddy  . 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 Total knee arthroplasty Left 1/15    Dr [facility]    There were no vitals filed for this visit.  Visit Diagnosis:  Coordination disorder  Muscular tone excessive      Subjective Assessment - 10/19/14 1622    Currently in Pain? Yes   Pain Score 4    Pain Location Arm   Pain Orientation Left   Pain Descriptors / Indicators Burning                              OT Education - 10/19/14 1639    Education provided Yes   Education  Details Education on why doing more extension   Person(s) Educated Patient   Methods Explanation   Comprehension Verbalized understanding      For fine motor to aid in his ADL: Used Judy/instructo board to flip pegs. Used velcro checkers for opposition and pinch. Completed card flipping with fingers only. Completed mobilization of scapula through out session to help normalize tone. Patient stretched into reflex inhibiting pattern through out session to aid in normalizing tone. Completed velcro board for finger extension.        OT Long Term Goals - 10/17/14 1501    OT LONG TERM GOAL #1   Title Patient will improve 9 hole peg test to 100 seconds to be able to button buttons.by 11/07/2014   Status On-going   OT LONG TERM GOAL #2   Title Patient will improve left upper extremity dexterity to be able to use his computer by5/23/2016   Status On-going               Plan - 10/19/14 1645    Clinical Impression Statement Patient continues to improve but requires Occupatioinal therapy for ADL and fine motor training.   Pt  will benefit from skilled therapeutic intervention in order to improve on the following deficits (Retired) Impaired UE functional use;Impaired tone;Pain   Rehab Potential Good   OT Frequency 2x / week   OT Duration 12 weeks   OT Treatment/Interventions Self-care/ADL training;Neuromuscular education   Plan Continue with self care and fine motor.        Problem List Patient Active Problem List   Diagnosis Date Noted  . 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 Fatigue 08/23/2014  . Frequent urination 08/23/2014  . Depression due to stroke 07/27/2014  . Intraparenchymal hemorrhage of brain   . Advanced directives, counseling/discussion 02/07/2014  . Routine general medical examination at a health care facility 02/13/2013  . Undiagnosed cardiac murmurs 01/30/2013  . Osteoarthritis of both knees   . MDD  (recurrent major depressive disorder) in remission   . Sudden cardiac death   . BPH (benign prostatic hypertrophy)     Sharon Mt 10/19/2014, 5:03 PM  [place] MAIN [facility] SERVICES [address] [place], [place], [address] Phone: [phone]   Fax:  [phone]

## 2014-10-19 NOTE — Therapy (Signed)
[place] MAIN [facility] SERVICES [address] [place], [place], [address] Phone: [phone]   Fax:  [phone]  Physical Therapy Treatment  Patient Details  Name: Keith Hall MRN: [id number] Date of Birth: [birth year] Referring Provider:  Venia Carbon, MD  Encounter Date: 10/19/2014      PT End of Session - 10/19/14 1742    Visit Number 21   Number of Visits 31   Date for PT Re-Evaluation 11/28/14   PT Start Time 5170   PT Stop Time 1730   PT Time Calculation (min) 45 min   Equipment Utilized During Treatment Gait belt   Activity Tolerance Patient tolerated treatment well   Behavior During Therapy [facility] for tasks assessed/performed      Past Medical History  Diagnosis Date  . Osteoarthritis of both knees   . Depression 1980's2004-04-10    MDD---even needed hospitalization  . Kidney stone 1980 only  . Sudden cardiac death 09-25-1999    no clear etiology of this  . BPH (benign prostatic hypertrophy)   . 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 Cardiovascular stress test  07/09/11    Negative--Dr Paraschos  . Cardiac catheterization  last in Sep 25, 2007    insignificant coronary disease  . Hernia repair  09-25-2010    left inguinal--Dr Byrnett  . Rotator cuff repair  09-25-2007    both done by Dr Margaretmary Eddy  . 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 Total knee arthroplasty Left 1/15    Dr Sebastian Ache    Filed Vitals:   10/19/14 1648  BP: 137/72  Pulse: 73  SpO2: 97%    Visit Diagnosis:  Abnormality of gait  Difficulty in walking      Subjective Assessment - 10/19/14 1737    Subjective pt reports his hamstring is feeling better but it still hurts. pt reports no new falls, but still feels unsteady on his feet   Patient Stated Goals improve walking confidence, improve hamstring pain   Currently in Pain? Yes   Pain Score 3     Pain Location Leg   Pain Orientation Left                         [facility] Adult PT Treatment/Exercise - 10/19/14 0001    Exercises   Exercises Knee/Hip   Knee/Hip Exercises: Stretches   Passive Hamstring Stretch 2 reps;60 seconds     Nustep L 5 x 2 min during history- no charge Hamstring stretch contract/relax x 2 min Passive hamstring stretch on step 60s x 2 Toe taps on large step (10in) 2x15 Side steps 51ft x 3  SLS 5-20 s holds- CGA to min A needed for RLE Gait training: In hallway amb x 18ft x 2 with SPC- SBA, cues for step length, toe clearance and heel strike Ambulation in hallway with SPC and CGA finding playing cards taped to wall pt has to look R/L while ambulating, same cues as above 7ft x 4 Ambulating with stop/go short commands 63m x 5 Ambulating with fwd/retro commands 20m x 5                 PT Long Term Goals - 10/17/14 1516    PT LONG TERM GOAL #1   Title Patient will be able to stand with eyes  closed for >30 seconds for decreased risk for fall by 6/13//2016   Status New   PT LONG TERM GOAL #2   Title Patient (>24 years old) will complete five times sit to stand test in < 14.2 seconds indicating a decreased likelihood of a balance dysfunction and improved LE strength by 11/28/2014   Status New   PT LONG TERM GOAL #3   Title Patient will be independent with ascent and descent of 13 steps using single UE in step over step pattern without LOB by 11/28/2014.   Status New   PT LONG TERM GOAL #4   Title Patient will improve gait speed to > 1.2 m/s with least restrictive assistive device for safety with indpendent community ambulation by 11/28/2014.   Status New   PT LONG TERM GOAL #5   Title Patient will be able to ambulate > 600 feet with least restrictive assistive device for safety with indpendent community ambulation by 11/28/2014.   Status New   Additional Long Term Goals   Additional Long Term Goals Yes   PT LONG TERM GOAL #6    Title Patient will be able to stand on single leg x 10 seconds to indicate improved standing balance by 11/28/2014.   Status New   PT LONG TERM GOAL #7   Title Patient will ambulate on an incline ascending and descending independently for 100 feet with spc by 11/28/2014.   Status New               Plan - 10/19/14 1743    Clinical Impression Statement pt continues to struggle with hamstring pain / tightness, but was able to complete all PT gait training/exercises with stretching incorporated throughout. PT progressed gait training to include multitasking and reaction time which was challenging for pt. pt was given home stretch program for the hamstring where he did show good demonstration. pt also had difficulty with single leg tasks so he would benefit from more skilled therapy in this area.    Pt will benefit from skilled therapeutic intervention in order to improve on the following deficits Abnormal gait;Decreased balance;Decreased strength;Pain;Decreased endurance;Difficulty walking   Rehab Potential Good   PT Frequency 2x / week   PT Duration 8 weeks   PT Treatment/Interventions Manual techniques;Gait training;Ultrasound;Therapeutic exercise   PT Next Visit Plan Progress with HEP, strength and balance activities, and pain relief techniques for LLE.    PT Home Exercise Plan Pt encouraged to continue with HEP, which he had stopped since his fall secondary to pain.    Consulted and Agree with Plan of Care Patient        Problem List Patient Active Problem List   Diagnosis Date Noted  . 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 Fatigue 08/23/2014  . Frequent urination 08/23/2014  . Depression due to stroke 07/27/2014  . Intraparenchymal hemorrhage of brain   . Advanced directives, counseling/discussion 02/07/2014  . Routine general medical examination at a health care facility 02/12/2013  . Undiagnosed cardiac murmurs  01/24/2013  . Osteoarthritis of both knees   . MDD (recurrent major depressive disorder) in remission   . Sudden cardiac death   . BPH (benign prostatic hypertrophy)    Caryl Pina C. Karliah Kowalchuk, PT, DPT [phone]  Luverta Korte 10/19/2014, 5:46 PM  [place] [facility] MAIN [facility] SERVICES [address] [place], [place], [address] Phone: [phone]   Fax:  [phone]

## 2014-10-19 NOTE — Patient Instructions (Signed)
Instructed patient to do less grip and more extension of his hand.

## 2014-10-19 NOTE — Patient Instructions (Signed)
Pt verbally instructed on hamstring stretch for HEP on step. Pt preformed x 2 in clinic

## 2014-10-24 ENCOUNTER — Ambulatory Visit: Payer: PPO | Admitting: Occupational Therapy

## 2014-10-24 ENCOUNTER — Ambulatory Visit: Payer: PPO

## 2014-10-24 DIAGNOSIS — R269 Unspecified abnormalities of gait and mobility: Secondary | ICD-10-CM

## 2014-10-24 DIAGNOSIS — M6289 Other specified disorders of muscle: Secondary | ICD-10-CM

## 2014-10-24 DIAGNOSIS — R279 Unspecified lack of coordination: Secondary | ICD-10-CM

## 2014-10-24 DIAGNOSIS — I69398 Other sequelae of cerebral infarction: Secondary | ICD-10-CM | POA: Diagnosis not present

## 2014-10-24 DIAGNOSIS — R262 Difficulty in walking, not elsewhere classified: Secondary | ICD-10-CM

## 2014-10-24 NOTE — Therapy (Signed)
[place] MAIN [facility] SERVICES [address] [place], [place], [address] Phone: [phone]   Fax:  [phone]  Occupational Therapy Treatment  Patient Details  Name: Keith Hall MRN: [id number] Date of Birth: 03/10/1946 Referring Provider:  Venia Carbon, MD  Encounter Date: Oct 31, 2014      OT End of Session - 10/31/2014 1707    Visit Number 20   Number of Visits 24   Date for OT Re-Evaluation 11/07/14   Authorization Type Medicare G codes   OT Start Time 1513/09/21   OT Stop Time 1600   OT Time Calculation (min) 45 min   Activity Tolerance Patient tolerated treatment well   Behavior During Therapy [facility] for tasks assessed/performed      Past Medical History  Diagnosis Date  . Osteoarthritis of both knees   . Depression 1980's04/07/04    MDD---even needed hospitalization  . Kidney stone 1980 only  . Sudden cardiac death 09/22/1999    no clear etiology of this  . BPH (benign prostatic hypertrophy)   . 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 Cardiovascular stress test  07/09/11    Negative--Dr Paraschos  . Cardiac catheterization  last in 2007-09-22    insignificant coronary disease  . Hernia repair  09-22-2010    left inguinal--Dr Byrnett  . Rotator cuff repair  2007-09-22    both done by Dr Margaretmary Eddy  . 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 Total knee arthroplasty Left 1/15    Dr [facility]    There were no vitals filed for this visit.  Visit Diagnosis:  Coordination disorder  Muscular tone excessive Completed mobilization of scapula through out session to help normalize tone. Patient stretched into reflex inhibiting pattern through out session to aid in normalizing tone. Used Judy/instructo board to flip pegs Completed card flipping with fingers only. Used velcro checkers for opposition and pinch                                   OT Long Term Goals - 10/17/14 1501    OT LONG TERM GOAL #1   Title Patient will improve 9 hole peg test to 100 seconds to be able to button buttons.by 11/07/2014   Status On-going   OT LONG TERM GOAL #2   Title Patient will improve left upper extremity dexterity to be able to use his computer by5/23/2016   Status On-going               Plan - 2014-10-31 1709    Pt will benefit from skilled therapeutic intervention in order to improve on the following deficits (Retired) Impaired UE functional use;Impaired tone;Pain   Rehab Potential Good   OT Frequency 2x / week   OT Duration 12 weeks   OT Treatment/Interventions Self-care/ADL training;Neuromuscular education   Consulted and Agree with Plan of Care Patient          G-Codes - 31-Oct-2014 1659    Functional Limitation Self care   Self Care Current Status (U1324) At least 20 percent but less than 40 percent impaired, limited or restricted   Self Care Goal Status (M0102) At least 1 percent but less than 20 percent impaired, limited or restricted      Problem List Patient  Active Problem List   Diagnosis Date Noted  . 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 Fatigue 08/23/2014  . Frequent urination 08/23/2014  . Depression due to stroke 07/27/2014  . Intraparenchymal hemorrhage of brain   . Advanced directives, counseling/discussion 02/07/2014  . Routine general medical examination at a health care facility 02/03/2013  . Undiagnosed cardiac murmurs 02/13/2013  . Osteoarthritis of both knees   . MDD (recurrent major depressive disorder) in remission   . Sudden cardiac death   . BPH (benign prostatic hypertrophy)     Sharon Mt 10/24/2014, 5:16 PM  [place] MAIN [facility] SERVICES [address] [place], [place], [address] Phone: [phone]   Fax:  [phone]

## 2014-10-24 NOTE — Therapy (Signed)
[place] MAIN [facility] SERVICES [address] [place], [place], [address] Phone: [phone]   Fax:  [phone]  Physical Therapy Treatment  Patient Details  Name: Keith Hall MRN: [id number] Date of Birth: [birth year] Referring Provider:  Venia Carbon, MD  Encounter Date: 10/24/2014      PT End of Session - 10/24/14 1522    Visit Number 22   Number of Visits 31   Date for PT Re-Evaluation 11/28/14   Authorization Type 10-09-22 G codes   PT Start Time 1430   PT Stop Time 1515   PT Time Calculation (min) 45 min   Equipment Utilized During Treatment Gait belt   Activity Tolerance Patient tolerated treatment well   Behavior During Therapy [facility] for tasks assessed/performed      Past Medical History  Diagnosis Date  . Osteoarthritis of both knees   . Depression 1980'sApril 14, 2004    MDD---even needed hospitalization  . Kidney stone 1980 only  . Sudden cardiac death 1999/09/29    no clear etiology of this  . BPH (benign prostatic hypertrophy)   . 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 Cardiovascular stress test  07/09/11    Negative--Dr Paraschos  . Cardiac catheterization  last in 2007/09/29    insignificant coronary disease  . Hernia repair  2010-09-29    left inguinal--Dr Byrnett  . Rotator cuff repair  29-Sep-2007    both done by Dr Margaretmary Eddy  . 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 Total knee arthroplasty Left 1/15    Dr [facility]    There were no vitals filed for this visit.  Visit Diagnosis:  Abnormality of gait  Difficulty in walking      Subjective Assessment - 10/24/14 1520    Subjective pt reports he has been stretching the hamstring and it is feeling quite a bit better, no pain today. pt reports no new falls, reports he would like to be more confident walking outdoors   Patient Stated Goals improve walking confidence,  improve hamstring pain   Currently in Pain? No/denies       PT treatment:  NMR: Nustep L 5 - 4 min no charge Resisted walking in cable column- fwd, retro and side to side 3 plates x 5 laps each Fwd step up onto 6inch step no UE x 10 each leg Step down 6inch step no UE x 5 each leg [employer] of Movement analysis and performance: pt performed ANT, PST SSL x 10 each leg- lower extremities only - no UE assist Pt required CGA for safety for all activities, cues for weight shifting L>R, min cues for proper performance                          PT Education - 10/24/14 1521    Education provided Yes   Education Details reduce fall risk   Person(s) Educated Patient   Methods Explanation   Comprehension Verbalized understanding             PT Long Term Goals - 10/17/14 1516    PT LONG TERM GOAL #1   Title Patient will be able to stand with eyes closed for >30 seconds for decreased risk for fall by 6/13//2016   Status New   PT LONG TERM GOAL #2  Title Patient (>24 years old) will complete five times sit to stand test in < 14.2 seconds indicating a decreased likelihood of a balance dysfunction and improved LE strength by 11/28/2014   Status New   PT LONG TERM GOAL #3   Title Patient will be independent with ascent and descent of 13 steps using single UE in step over step pattern without LOB by 11/28/2014.   Status New   PT LONG TERM GOAL #4   Title Patient will improve gait speed to > 1.2 m/s with least restrictive assistive device for safety with indpendent community ambulation by 11/28/2014.   Status New   PT LONG TERM GOAL #5   Title Patient will be able to ambulate > 600 feet with least restrictive assistive device for safety with indpendent community ambulation by 11/28/2014.   Status New   Additional Long Term Goals   Additional Long Term Goals Yes   PT LONG TERM GOAL #6   Title Patient will be able to stand on single leg x 10 seconds to indicate  improved standing balance by 11/28/2014.   Status New   PT LONG TERM GOAL #7   Title Patient will ambulate on an incline ascending and descending independently for 100 feet with spc by 11/28/2014.   Status New               Plan - 10/24/14 1526    Clinical Impression Statement pt hamstring is improving with stretching program at home, and did not interfere with treatment today. progressed NMR today with heavy focus again on weight shifting to the L and strengthening of the L side in particular. also progressed NMR to address difficulty ascending/descending curbs in the community, potentialy without an assistive device.    Pt will benefit from skilled therapeutic intervention in order to improve on the following deficits Abnormal gait;Decreased balance;Decreased strength;Pain;Decreased endurance;Difficulty walking   Rehab Potential Good   PT Frequency 2x / week   PT Duration 8 weeks   PT Treatment/Interventions Manual techniques;Gait training;Ultrasound;Therapeutic exercise   PT Next Visit Plan Progress with HEP, strength and balance activities, and pain relief techniques for LLE.    PT Home Exercise Plan Pt encouraged to continue with HEP, which he had stopped since his fall secondary to pain.    Consulted and Agree with Plan of Care Patient        Problem List Patient Active Problem List   Diagnosis Date Noted  . 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 Fatigue 08/23/2014  . Frequent urination 08/23/2014  . Depression due to stroke 07/27/2014  . Intraparenchymal hemorrhage of brain   . Advanced directives, counseling/discussion 02/07/2014  . Routine general medical examination at a health care facility 01/18/2013  . Undiagnosed cardiac murmurs 02/02/2013  . Osteoarthritis of both knees   . MDD (recurrent major depressive disorder) in remission   . Sudden cardiac death   . BPH (benign prostatic hypertrophy)    Caryl Pina  C. Arasely Akkerman, PT, DPT [phone]  Yamilka Lopiccolo 10/24/2014, 3:31 PM  [place] MAIN [facility] SERVICES [address] [place], [place], [address] Phone: [phone]   Fax:  [phone]

## 2014-10-26 ENCOUNTER — Encounter: Payer: Self-pay | Admitting: Occupational Therapy

## 2014-10-26 ENCOUNTER — Ambulatory Visit: Payer: PPO

## 2014-10-26 ENCOUNTER — Ambulatory Visit: Payer: PPO | Admitting: Occupational Therapy

## 2014-10-26 DIAGNOSIS — R279 Unspecified lack of coordination: Secondary | ICD-10-CM

## 2014-10-26 DIAGNOSIS — R262 Difficulty in walking, not elsewhere classified: Secondary | ICD-10-CM

## 2014-10-26 DIAGNOSIS — R269 Unspecified abnormalities of gait and mobility: Secondary | ICD-10-CM

## 2014-10-26 DIAGNOSIS — M6289 Other specified disorders of muscle: Secondary | ICD-10-CM

## 2014-10-26 DIAGNOSIS — I69398 Other sequelae of cerebral infarction: Secondary | ICD-10-CM | POA: Diagnosis not present

## 2014-10-26 NOTE — Therapy (Signed)
[place] MAIN [facility] SERVICES [address] [place], [place], [address] Phone: [phone]   Fax:  [phone]  Occupational Therapy Treatment  Patient Details  Name: Keith Hall MRN: [id number] Date of Birth: 10-16-1945 Referring Provider:  Venia Carbon, MD  Encounter Date: 10/26/2014      OT End of Session - 10/26/14 1708    Visit Number 21   Number of Visits 24   Date for OT Re-Evaluation 11/07/14   OT Start Time 1515   OT Stop Time 1600   OT Time Calculation (min) 45 min   Activity Tolerance Patient tolerated treatment well   Behavior During Therapy [facility] for tasks assessed/performed      Past Medical History  Diagnosis Date  . Osteoarthritis of both knees   . Depression 1980's2004/04/16    MDD---even needed hospitalization  . Kidney stone 1980 only  . Sudden cardiac death 10/01/99    no clear etiology of this  . BPH (benign prostatic hypertrophy)   . 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 Cardiovascular stress test  07/09/11    Negative--Dr Paraschos  . Cardiac catheterization  last in 10/01/07    insignificant coronary disease  . Hernia repair  10/01/10    left inguinal--Dr Byrnett  . Rotator cuff repair  01-Oct-2007    both done by Dr Margaretmary Eddy  . 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 Total knee arthroplasty Left 1/15    Dr [facility]    There were no vitals filed for this visit.  Visit Diagnosis:  Coordination disorder      Subjective Assessment - 10/26/14 1704    Subjective  How do you know that (referring to ways to reduce tone)   Currently in Pain? No/denies    Completed fine motor tasks including: Used Judy/instructo board to flip pegs, Completed card flipping with fingers only, Picked up coins starting largest to smallest and placed in container, Used velcro checkers for opposition and pinch,  Scapular mobilization and stretching into Patient stretched into reflex inhibiting pattern through out session to aid in normalizing tone. Completed weight bearing through out session.                          OT Education - 10/26/14 1706    Education provided Yes   Education Details Reviewing weight bearing to reduce tone   Person(s) Educated Patient   Methods Explanation;Demonstration   Comprehension Verbalized understanding             OT Long Term Goals - 10/17/14 1501    OT LONG TERM GOAL #1   Title Patient will improve 9 hole peg test to 100 seconds to be able to button buttons.by 11/07/2014   Status On-going   OT LONG TERM GOAL #2   Title Patient will improve left upper extremity dexterity to be able to use his computer by5/23/2016   Status On-going               Plan - 10/26/14 1709    Clinical Impression Statement Patient has progressed with ADL and upper extremity restoration. Needs a few more sessions to be clear on carry over at home.   Pt will benefit from skilled therapeutic intervention in order to improve on the following deficits (Retired) Impaired UE  functional use;Impaired tone;Pain   Rehab Potential Good   Clinical Impairments Affecting Rehab Potential R UE spasticity    OT Frequency 2x / week   OT Duration 12 weeks   OT Treatment/Interventions Self-care/ADL training;Neuromuscular education   Plan Continue with nerupmusclular reeducation to maximize ADL independence   OT [place] with weight bearing and fine motor    Consulted and Agree with Plan of Care Patient        Problem List Patient Active Problem List   Diagnosis Date Noted  . 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 Fatigue 08/23/2014  . Frequent urination 08/23/2014  . Depression due to stroke 07/27/2014  . Intraparenchymal hemorrhage of brain   . Advanced directives,  counseling/discussion 02/07/2014  . Routine general medical examination at a health care facility 02/04/2013  . Undiagnosed cardiac murmurs 01/27/2013  . Osteoarthritis of both knees   . MDD (recurrent major depressive disorder) in remission   . Sudden cardiac death   . BPH (benign prostatic hypertrophy)     Sharon Mt 10/26/2014, 5:36 PM  [place] MAIN [facility] SERVICES [address] [place], [place], [address] Phone: [phone]   Fax:  [phone]

## 2014-10-26 NOTE — Therapy (Signed)
[place] MAIN [facility] SERVICES [address] [place], [place], [address] Phone: [phone]   Fax:  [phone]  Physical Therapy Treatment  Patient Details  Name: Keith Hall MRN: [id number] Date of Birth: Nov 12, 1945 Referring Provider:  Venia Carbon, MD  Encounter Date: 10/26/2014      PT End of Session - 10/26/14 1541    Visit Number 23   Number of Visits 31   Date for PT Re-Evaluation 11/28/14   Authorization Type 5/10 visits    PT Start Time 1941   PT Stop Time 1505   PT Time Calculation (min) 45 min   Equipment Utilized During Treatment Gait belt   Activity Tolerance Patient tolerated treatment well   Behavior During Therapy [facility] for tasks assessed/performed      Past Medical History  Diagnosis Date  . Osteoarthritis of both knees   . Depression 1980's2004/04/03    MDD---even needed hospitalization  . Kidney stone 1980 only  . Sudden cardiac death 09-18-99    no clear etiology of this  . BPH (benign prostatic hypertrophy)   . 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 Cardiovascular stress test  07/09/11    Negative--Dr Paraschos  . Cardiac catheterization  last in September 18, 2007    insignificant coronary disease  . Hernia repair  2010/09/18    left inguinal--Dr Byrnett  . Rotator cuff repair  09/18/2007    both done by Dr Margaretmary Eddy  . 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 Total knee arthroplasty Left 1/15    Dr [facility]  **Note today's treatment was observed by [facility] DPT students, appropriate waivers were signed**  There were no vitals filed for this visit.  Visit Diagnosis:  Abnormality of gait  Difficulty in walking      Subjective Assessment - 10/26/14 1540    Subjective pt reports his hamstring is almost better. pt reports he thinks he can walk for about 20 min without getting too tired.    Patient Stated  Goals improve walking confidence,    Currently in Pain? No/denies      Objective: Therex: Leg press : single leg 75lbs 3x15 each side Squat without UE- modified R hand to L pocked to increase load on LLE x 10  NMR:  Toe touch to balance stones: verbal call-out for L/R foot and the color: cues for weight shifting to the L: min A as needed  Modified SLS on balance stone x 1 min each leg- 2 sets no UE Side step over 1/2 foam roll: no UE 2x8 cues for wt shift and foot clearance Cable column walk out: 4 plates fwd/retro x 5 laps, 3 plates for side steps x 5 laps each- cues for weight shift L>R In hallway ambulating with SPC finding cards L/R on wall, cues for step length, cadence, CGA for safety Fwd/retro ambulation with SPC commands to switch x 6 min : cues for step length, cadence, CGA for safety Standing on AIREX ball toss 2x15  NBOS on AIREX 2x1 min cues for upright posture, CGA for safety rockerboard side to side shift and static hold in the middle x  4 in total                               PT  Education - 10/26/14 1541    Education provided Yes   Education Details reduce fall risk   Person(s) Educated Patient   Methods Explanation   Comprehension Verbalized understanding             PT Long Term Goals - 10/26/14 1545    PT LONG TERM GOAL #1   Title Patient will be able to stand with eyes closed for >30 seconds for decreased risk for fall by 6/13//2016   Status On-going   PT LONG TERM GOAL #2   Title Patient (>71 years old) will complete five times sit to stand test in < 14.2 seconds indicating a decreased likelihood of a balance dysfunction and improved LE strength by 11/28/2014   Status On-going   PT LONG TERM GOAL #3   Title Patient will be independent with ascent and descent of 13 steps using single UE in step over step pattern without LOB by 11/28/2014.   Status On-going   PT LONG TERM GOAL #4   Title Patient will improve gait speed to > 1.2  m/s with least restrictive assistive device for safety with indpendent community ambulation by 11/28/2014.   Status On-going   PT LONG TERM GOAL #5   Title Patient will be able to ambulate > 600 feet with least restrictive assistive device for safety with indpendent community ambulation by 11/28/2014.   Status On-going   PT LONG TERM GOAL #6   Title Patient will be able to stand on single leg x 10 seconds to indicate improved standing balance by 11/28/2014.   Status On-going   PT LONG TERM GOAL #7   Title Patient will ambulate on an incline ascending and descending independently for 100 feet with spc by 11/28/2014.   Status On-going               Plan - 10/26/14 1601    Clinical Impression Statement pt progressing well through dynamic strength, balance and gait activities, including single leg activities and tasks which involve multi-tasking. pt still needs min cues for step length and arm swing.    Pt will benefit from skilled therapeutic intervention in order to improve on the following deficits Abnormal gait;Decreased balance;Decreased strength;Pain;Decreased endurance;Difficulty walking   Rehab Potential Good   PT Frequency 2x / week   PT Duration 8 weeks   PT Treatment/Interventions Manual techniques;Gait training;Ultrasound;Therapeutic exercise;Neuromuscular re-education;Balance training;Therapeutic activities;Patient/family education   PT Next Visit Plan --   PT Home Exercise Plan --   Consulted and Agree with Plan of Care Patient       Problem List Patient Active Problem List   Diagnosis Date Noted  . 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 Fatigue 08/23/2014  . Frequent urination 08/23/2014  . Depression due to stroke 07/27/2014  . Intraparenchymal hemorrhage of brain   . Advanced directives, counseling/discussion 02/07/2014  . Routine general medical examination at a health care facility 02/06/2013  .  Undiagnosed cardiac murmurs 02/13/2013  . Osteoarthritis of both knees   . MDD (recurrent major depressive disorder) in remission   . Sudden cardiac death   . BPH (benign prostatic hypertrophy)    Caryl Pina C. Isaid Salvia, PT, DPT [phone]  Griffen Frayne 10/26/2014, 4:08 PM  [place] MAIN [facility] SERVICES [address] [place], [place], [address] Phone: [phone]   Fax:  [phone]

## 2014-10-31 ENCOUNTER — Ambulatory Visit: Payer: PPO

## 2014-10-31 ENCOUNTER — Ambulatory Visit: Payer: PPO | Admitting: Occupational Therapy

## 2014-10-31 DIAGNOSIS — R269 Unspecified abnormalities of gait and mobility: Secondary | ICD-10-CM

## 2014-10-31 DIAGNOSIS — I69398 Other sequelae of cerebral infarction: Secondary | ICD-10-CM | POA: Diagnosis not present

## 2014-10-31 DIAGNOSIS — M6289 Other specified disorders of muscle: Secondary | ICD-10-CM

## 2014-10-31 DIAGNOSIS — R262 Difficulty in walking, not elsewhere classified: Secondary | ICD-10-CM

## 2014-10-31 DIAGNOSIS — R279 Unspecified lack of coordination: Secondary | ICD-10-CM

## 2014-10-31 NOTE — Therapy (Signed)
[place] MAIN [facility] SERVICES [address] [place], [place], [address] Phone: [phone]   Fax:  [phone]  Physical Therapy Treatment  Patient Details  Name: Keith Hall MRN: [id number] Date of Birth: [birth year] Referring Provider:  Venia Carbon, MD  Encounter Date: 10/31/2014      PT End of Session - 10/31/14 1635    Visit Number 24   Number of Visits 31   Date for PT Re-Evaluation 11/28/14   Authorization Type 6/10   PT Start Time 1302   PT Stop Time 1345   PT Time Calculation (min) 43 min   Equipment Utilized During Treatment Gait belt   Activity Tolerance Patient tolerated treatment well   Behavior During Therapy [facility] for tasks assessed/performed      Past Medical History  Diagnosis Date  . Osteoarthritis of both knees   . Depression 1980's2004/02/02    MDD---even needed hospitalization  . Kidney stone 1980 only  . Sudden cardiac death 07/20/99    no clear etiology of this  . BPH (benign prostatic hypertrophy)   . 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 Cardiovascular stress test  07/09/11    Negative--Dr Paraschos  . Cardiac catheterization  last in 2007/07/20    insignificant coronary disease  . Hernia repair  2010-07-19    left inguinal--Dr Byrnett  . Rotator cuff repair  07-20-2007    both done by Dr Margaretmary Eddy  . 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 Total knee arthroplasty Left 1/15    Dr [facility]    There were no vitals filed for this visit.  Visit Diagnosis:  Abnormality of gait  Difficulty in walking      Subjective Assessment - 10/31/14 1633    Subjective pt reports he felt his knee buckle over the weekend after he got out of a chair, but was able to catch himself.    Patient Stated Goals improve walking confidence,    Currently in Pain? No/denies       Treatment: Therex:  Sit to stand-  R hand to pocket to increase load on LLE- x 10 from outdoor bench- SBA for safety  Knee extension on cybex: 1 plate 3x10- cues for TKE Hamstring curls on cybex 1 plate 2x10  NMR: outdoors- ambulating in grass/uneven terrain using SPC 3x160f, cues for foot clearance, step length and TKE on the L, close CGA Side stepping in grass 273fx 2 R and L Ambulating in grass- eyes closed 5065f 3, same cues as above Ambulating up and down hill in grass [id number] 4 square step no AD, x 10 Cw and CCW- cues for step length and weight shift Pt required CGA for safety, min A needed for LOB x 3                          PT Education - 10/31/14 1633    Education provided Yes   Education Details emphasize terminal knee extension at heel strike on the LLE   Person(s) Educated Patient   Methods Explanation   Comprehension Returned demonstration             PT Long Term Goals - 10/31/14 1638    PT LONG TERM GOAL #1   Title Patient will be able to  stand with eyes closed for >30 seconds for decreased risk for fall by 6/13//2016   Status Partially Met   PT LONG TERM GOAL #2   Title Patient (>71 years old) will complete five times sit to stand test in < 14.2 seconds indicating a decreased likelihood of a balance dysfunction and improved LE strength by 11/28/2014   Status Partially Met   PT LONG TERM GOAL #3   Title Patient will be independent with ascent and descent of 13 steps using single UE in step over step pattern without LOB by 11/28/2014.   Status Partially Met   PT LONG TERM GOAL #4   Title Patient will improve gait speed to > 1.2 m/s with least restrictive assistive device for safety with indpendent community ambulation by 11/28/2014.   Status Partially Met   PT LONG TERM GOAL #5   Title Patient will be able to ambulate > 600 feet with least restrictive assistive device for safety with indpendent community ambulation by 11/28/2014.   Status Achieved   PT LONG TERM GOAL #6    Title Patient will be able to stand on single leg x 10 seconds to indicate improved standing balance by 11/28/2014.   Status Partially Met   PT LONG TERM GOAL #7   Title Patient will ambulate on an incline ascending and descending independently for 100 feet with spc by 11/28/2014.   Status Partially Met               Plan - 10/31/14 1636    Clinical Impression Statement pt did well with progression of NMR on outsdie, uneven terrain. pt did need more frequent cues and closer guard when walking in grass for foot clearance and ternimal knee extension at heel strike. pt progressing well towards goals, pt would like to feel more confortable walking outside. Also progressed quat strengthening to aid with knee stability.    Pt will benefit from skilled therapeutic intervention in order to improve on the following deficits Abnormal gait;Decreased balance;Decreased strength;Pain;Decreased endurance;Difficulty walking   Rehab Potential Good   PT Frequency 2x / week   PT Duration 8 weeks   PT Treatment/Interventions Manual techniques;Gait training;Ultrasound;Therapeutic exercise;Neuromuscular re-education;Balance training;Therapeutic activities;Patient/family education   PT Next Visit Plan Progress with HEP, strength and balance activities   Consulted and Agree with Plan of Care Patient        Problem List Patient Active Problem List   Diagnosis Date Noted  . 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 Fatigue 08/23/2014  . Frequent urination 08/23/2014  . Depression due to stroke 07/27/2014  . Intraparenchymal hemorrhage of brain   . Advanced directives, counseling/discussion 02/07/2014  . Routine general medical examination at a health care facility 01/17/2013  . Undiagnosed cardiac murmurs 02/09/2013  . Osteoarthritis of both knees   . MDD (recurrent major depressive disorder) in remission   . Sudden cardiac death   . BPH (benign  prostatic hypertrophy)    Caryl Pina C. , PT, DPT [phone]  , 10/31/2014, 4:42 PM  [place] MAIN [facility] SERVICES [address] [place], [place], [address] Phone: [phone]   Fax:  [phone]

## 2014-10-31 NOTE — Therapy (Signed)
[place] MAIN [facility] SERVICES [address] [place], [place], [address] Phone: [phone]   Fax:  [phone]  Occupational Therapy Treatment  Patient Details  Name: Keith Hall MRN: [id number] Date of Birth: [birth year] Referring Provider:  Venia Carbon, MD  Encounter Date: 10/31/2014      OT End of Session - 10/31/14 1628    Visit Number 22   Number of Visits 24   OT Start Time 1346   OT Stop Time 1430   OT Time Calculation (min) 44 min   Activity Tolerance Patient tolerated treatment well   Behavior During Therapy [facility] for tasks assessed/performed      Past Medical History  Diagnosis Date  . Osteoarthritis of both knees   . Depression 1980's04-Apr-2004    MDD---even needed hospitalization  . Kidney stone 1980 only  . Sudden cardiac death 19-Sep-1999    no clear etiology of this  . BPH (benign prostatic hypertrophy)   . 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 Cardiovascular stress test  07/09/11    Negative--Dr Paraschos  . Cardiac catheterization  last in 09/19/07    insignificant coronary disease  . Hernia repair  September 19, 2010    left inguinal--Dr Byrnett  . Rotator cuff repair  2007/09/19    both done by Dr Margaretmary Eddy  . 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 Total knee arthroplasty Left 1/15    Dr [facility]    There were no vitals filed for this visit.  Visit Diagnosis:  Coordination disorder  Muscular tone excessive                            OT Education - 10/31/14 09-18-25    Education provided Yes   Education Details Regarding why his hand is "buzzing"   Person(s) Educated Patient   Methods Explanation   Comprehension Verbalized understanding             OT Long Term Goals - 10/17/14 1501    OT LONG TERM GOAL #1   Title Patient will improve 9 hole peg test to 100 seconds to be  able to button buttons.by 11/07/2014   Status On-going   OT LONG TERM GOAL #2   Title Patient will improve left upper extremity dexterity to be able to use his computer by5/23/2016   Status On-going    Fine motor used to aid in ADL; Used velcro checkers for opposition and pinch. Completed card flipping with fingers only. Used Judy/instructo board to flip pegs ADL: Patient is now doing the following independently - Dressing - Bathing (supervision - washing dishes -  Making a meal. Most likely will discharge next week.           Plan - 10/31/14 1630    Clinical Impression Statement Patient has made progress with ADL.    Pt will benefit from skilled therapeutic intervention in order to improve on the following deficits (Retired) Impaired UE functional use;Impaired tone;Pain   Rehab Potential Good   OT Frequency 2x / week   OT Duration 12 weeks   OT Treatment/Interventions Self-care/ADL training;Neuromuscular education   OT Home Exercise Plan Continues with weight bearing and fine motor         Problem List Patient Active Problem List   Diagnosis Date  Noted  . 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 Fatigue 08/23/2014  . Frequent urination 08/23/2014  . Depression due to stroke 07/27/2014  . Intraparenchymal hemorrhage of brain   . Advanced directives, counseling/discussion 02/07/2014  . Routine general medical examination at a health care facility 01/16/2013  . Undiagnosed cardiac murmurs 01/16/2013  . Osteoarthritis of both knees   . MDD (recurrent major depressive disorder) in remission   . Sudden cardiac death   . BPH (benign prostatic hypertrophy)    Sharon Mt, MS/OTR/L Sharon Mt 10/31/2014, 4:37 PM  [place] MAIN [facility] SERVICES [address] [place], [place], [address] Phone: [phone]   Fax:  [phone]

## 2014-11-02 ENCOUNTER — Ambulatory Visit: Payer: PPO

## 2014-11-02 ENCOUNTER — Encounter: Payer: Self-pay | Admitting: Occupational Therapy

## 2014-11-02 ENCOUNTER — Ambulatory Visit: Payer: PPO | Admitting: Occupational Therapy

## 2014-11-02 DIAGNOSIS — M6289 Other specified disorders of muscle: Secondary | ICD-10-CM

## 2014-11-02 DIAGNOSIS — R262 Difficulty in walking, not elsewhere classified: Secondary | ICD-10-CM

## 2014-11-02 DIAGNOSIS — R279 Unspecified lack of coordination: Secondary | ICD-10-CM

## 2014-11-02 DIAGNOSIS — I69398 Other sequelae of cerebral infarction: Secondary | ICD-10-CM | POA: Diagnosis not present

## 2014-11-02 DIAGNOSIS — R269 Unspecified abnormalities of gait and mobility: Secondary | ICD-10-CM

## 2014-11-02 NOTE — Therapy (Signed)
[place] MAIN [facility] SERVICES [address] [place], [place], [address] Phone: [phone]   Fax:  [phone]  Physical Therapy Treatment  Patient Details  Name: Keith Hall MRN: [id number] Date of Birth: [birth year] Referring Provider:  Venia Carbon, MD  Encounter Date: 11/02/2014      PT End of Session - 11/02/14 1508    Visit Number 25   Number of Visits 31   Date for PT Re-Evaluation 11/28/14   Authorization Type 7/10   PT Start Time 1302   PT Stop Time 1345   PT Time Calculation (min) 43 min   Equipment Utilized During Treatment Gait belt   Activity Tolerance Patient tolerated treatment well   Behavior During Therapy [facility] for tasks assessed/performed      Past Medical History  Diagnosis Date  . Osteoarthritis of both knees   . Depression 1980's03/29/2004    MDD---even needed hospitalization  . Kidney stone 1980 only  . Sudden cardiac death 1999-09-13    no clear etiology of this  . BPH (benign prostatic hypertrophy)   . 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 Cardiovascular stress test  07/09/11    Negative--Dr Paraschos  . Cardiac catheterization  last in 09-13-2007    insignificant coronary disease  . Hernia repair  2010-09-13    left inguinal--Dr Byrnett  . Rotator cuff repair  13-Sep-2007    both done by Dr Margaretmary Eddy  . 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 Total knee arthroplasty Left 1/15    Dr [facility]    There were no vitals filed for this visit.  Visit Diagnosis:  Abnormality of gait  Difficulty in walking      Subjective Assessment - 11/02/14 1507    Subjective pt reports he plans to return to the [facility] silver sneaker program next week   Patient Stated Goals improve walking confidence,    Currently in Pain? No/denies     PT assessed outcome measures and progress towards goals: 60m walk test with  SPC: 1.80m/s 5x sit to stand 13s Pt stood x 30s with eyes closed safely SLS on RLE x 10 s Pt can stand SLS on LLE x 2 s  TM walking: cues for safety getting of and off 0.5-0.63mph- cues for step length, safety, staying close enough x 5 min Octane trainer, cues for safety getting on and off, instruction on proper use of equipment, cues for equal use of LLE L8 x 4 min  Fwd step ups- 1UE x 10 each leg Side steps x 10 each way Cues for full knee ext before step down CGA for safety                             PT Education - 11/02/14 1507    Education provided Yes   Education Details independent exercise precautions, do not try to walk on TM independently yet   Person(s) Educated Patient   Methods Explanation   Comprehension Verbalized understanding             PT Long Term Goals - 11/02/14 1511    PT LONG TERM GOAL #1   Title Patient will be able to stand with eyes closed for >30 seconds for decreased risk for fall by 6/13//2016   Time  6   Period Weeks   Status Achieved   PT LONG TERM GOAL #2   Title Patient (>4 years old) will complete five times sit to stand test in < 14.2 seconds indicating a decreased likelihood of a balance dysfunction and improved LE strength by 11/28/2014   Time 6   Period Weeks   Status Achieved   PT LONG TERM GOAL #3   Title Patient will be independent with ascent and descent of 13 steps using single UE in step over step pattern without LOB by 11/28/2014.   Time 6   Period Weeks   Status Achieved   PT LONG TERM GOAL #4   Title Patient will improve gait speed to > 1.2 m/s with least restrictive assistive device for safety with indpendent community ambulation by 11/28/2014.   Baseline 11/02/14 1.74m/s   Time 6   Status Partially Met   PT LONG TERM GOAL #5   Title Patient will be able to ambulate > 600 feet with least restrictive assistive device for safety with indpendent community ambulation by 11/28/2014.   Time 6   Period  Weeks   Status Achieved   PT LONG TERM GOAL #6   Title Patient will be able to stand on single leg x 10 seconds to indicate improved standing balance by 11/28/2014.   Time 6   Period Weeks   Status Achieved   PT LONG TERM GOAL #7   Title Patient will ambulate on an incline ascending and descending independently for 100 feet with spc by 11/28/2014.   Time 6   Period Weeks   Status Achieved               Plan - 11/02/14 1510    Clinical Impression Statement began orientation to gym equipment to prepare for DC to HEP after next viist. pt has met majority of PT goals at this time. plan to progress HEP next visit and discharge. pt was not safe to walk independently on the TM needing CGA and cues to stay safe and close enough to handle bars. began recommendations for strength training independently and other options for cardiovasular health. pt made good progress towards goals with reched of goals and outcome measures today.    Pt will benefit from skilled therapeutic intervention in order to improve on the following deficits Abnormal gait;Decreased balance;Decreased strength;Pain;Decreased endurance;Difficulty walking   Rehab Potential Good   PT Frequency 2x / week   PT Duration 8 weeks   PT Treatment/Interventions Manual techniques;Gait training;Ultrasound;Therapeutic exercise;Neuromuscular re-education;Balance training;Therapeutic activities;Patient/family education   PT Next Visit Plan Progress with HEP, strength and balance activities   Consulted and Agree with Plan of Care Patient        Problem List Patient Active Problem List   Diagnosis Date Noted  . 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 Fatigue 08/23/2014  . Frequent urination 08/23/2014  . Depression due to stroke 07/27/2014  . Intraparenchymal hemorrhage of brain   . Advanced directives, counseling/discussion 02/07/2014  . Routine general medical examination at  a health care facility 02/09/2013  . Undiagnosed cardiac murmurs 01/31/2013  . Osteoarthritis of both knees   . MDD (recurrent major depressive disorder) in remission   . Sudden cardiac death   . BPH (benign prostatic hypertrophy)    Caryl Pina C. Michaelene Dutan, PT, DPT [phone]  [place] 11/02/2014, 3:16 PM  [place] MAIN [facility] SERVICES [address] [place], [place], [address] Phone: [phone]  Fax:  [phone]

## 2014-11-02 NOTE — Therapy (Signed)
[place] MAIN [facility] SERVICES [address] [place], [place], [address] Phone: [phone]   Fax:  [phone]  Occupational Therapy Treatment  Patient Details  Name: Keith Hall MRN: [id number] Date of Birth: 31-Aug-1945 Referring Provider:  Venia Carbon, MD  Encounter Date: 11/02/2014      OT End of Session - 11/02/14 1432    OT Start Time 1345   OT Stop Time 1430   OT Time Calculation (min) 45 min   Activity Tolerance Patient tolerated treatment well   Behavior During Therapy [facility] for tasks assessed/performed      Past Medical History  Diagnosis Date  . Osteoarthritis of both knees   . Depression 1980's04-23-2004    MDD---even needed hospitalization  . Kidney stone 1980 only  . Sudden cardiac death 10/08/99    no clear etiology of this  . BPH (benign prostatic hypertrophy)   . 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 Cardiovascular stress test  07/09/11    Negative--Dr Paraschos  . Cardiac catheterization  last in Oct 08, 2007    insignificant coronary disease  . Hernia repair  10-08-10    left inguinal--Dr Byrnett  . Rotator cuff repair  10/08/2007    both done by Dr Margaretmary Eddy  . 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 Total knee arthroplasty Left 1/15    Dr [facility]    There were no vitals filed for this visit.  Visit Diagnosis:  Coordination disorder  Muscular tone excessive      Subjective Assessment - 11/02/14 1402    Subjective  This hand buzzes like it if full of bees.   Currently in Pain? Yes   Pain Score 5    Pain Location Hand   Pain Orientation Left   Pain Onset 1 to 4 weeks ago    Completed fine motor to aid in ADL independence:  Completed card flipping with fingers only. Picked up coins starting largest to smallest and placed in container. Removed and replaced nuts on bolts.  Received stretching in  reflex inhibiting pattern and scapular mobilization through out the session.                               OT Long Term Goals - 10/17/14 1501    OT LONG TERM GOAL #1   Title Patient will improve 9 hole peg test to 100 seconds to be able to button buttons.by 11/07/2014   Status On-going   OT LONG TERM GOAL #2   Title Patient will improve left upper extremity dexterity to be able to use his computer by5/23/2016   Status On-going               Plan - 11/02/14 1433    Pt will benefit from skilled therapeutic intervention in order to improve on the following deficits (Retired) Impaired UE functional use;Impaired tone;Pain   Rehab Potential Good   Clinical Impairments Affecting Rehab Potential R UE spasticity    OT Treatment/Interventions Self-care/ADL training;Neuromuscular education   OT Home Exercise Plan Continues with weight bearing and fine motor         Problem List Patient Active Problem List   Diagnosis Date Noted  . 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 Fatigue  08/23/2014  . Frequent urination 08/23/2014  . Depression due to stroke 07/27/2014  . Intraparenchymal hemorrhage of brain   . Advanced directives, counseling/discussion 02/07/2014  . Routine general medical examination at a health care facility 01/16/2013  . Undiagnosed cardiac murmurs 02/07/2013  . Osteoarthritis of both knees   . MDD (recurrent major depressive disorder) in remission   . Sudden cardiac death   . BPH (benign prostatic hypertrophy)    Sharon Mt, MS/OTR/L Sharon Mt 11/02/2014, 2:35 PM  [place] MAIN [facility] SERVICES [address] [place], [place], [address] Phone: [phone]   Fax:  [phone]

## 2014-11-07 ENCOUNTER — Ambulatory Visit: Payer: PPO | Admitting: Occupational Therapy

## 2014-11-07 ENCOUNTER — Encounter: Payer: Self-pay | Admitting: Occupational Therapy

## 2014-11-07 ENCOUNTER — Ambulatory Visit: Payer: PPO

## 2014-11-07 DIAGNOSIS — R262 Difficulty in walking, not elsewhere classified: Secondary | ICD-10-CM

## 2014-11-07 DIAGNOSIS — R279 Unspecified lack of coordination: Secondary | ICD-10-CM

## 2014-11-07 DIAGNOSIS — I69398 Other sequelae of cerebral infarction: Secondary | ICD-10-CM | POA: Diagnosis not present

## 2014-11-07 DIAGNOSIS — M6289 Other specified disorders of muscle: Secondary | ICD-10-CM

## 2014-11-07 DIAGNOSIS — R269 Unspecified abnormalities of gait and mobility: Secondary | ICD-10-CM

## 2014-11-07 NOTE — Therapy (Signed)
[place] MAIN [facility] SERVICES [address] [place], [place], [address] Phone: [phone]   Fax:  [phone]  Physical Therapy Treatment/Discharge  Patient Details  Name: Keith Hall MRN: [id number] Date of Birth: 1945/08/31 Referring Provider:  Venia Carbon, MD  Encounter Date: 11/07/2014      PT End of Session - 11/07/14 1526    Visit Number 26   Number of Visits 31   Date for PT Re-Evaluation 11/28/14   Authorization Type 8/10   PT Start Time 09-12-1343   PT Stop Time 1420   PT Time Calculation (min) 35 min   Equipment Utilized During Treatment Gait belt   Activity Tolerance Patient tolerated treatment well   Behavior During Therapy [facility] for tasks assessed/performed      Past Medical History  Diagnosis Date  . Osteoarthritis of both knees   . Depression 1980'sMarch 28, 2004    MDD---even needed hospitalization  . Kidney stone 1980 only  . Sudden cardiac death 12-Sep-1999    no clear etiology of this  . BPH (benign prostatic hypertrophy)   . 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 Cardiovascular stress test  07/09/11    Negative--Dr Paraschos  . Cardiac catheterization  last in 12-Sep-2007    insignificant coronary disease  . Hernia repair  09/12/2010    left inguinal--Dr Byrnett  . Rotator cuff repair  12-Sep-2007    both done by Dr Margaretmary Eddy  . 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 Total knee arthroplasty Left 1/15    Dr [facility]    There were no vitals filed for this visit.  Visit Diagnosis:  Difficulty in walking  Abnormality of gait      Subjective Assessment - 11/07/14 1523    Subjective pt reports he has returned to the [facility] and is exercising on his own and is happy with his progress.    Patient Stated Goals improve walking confidence,    Currently in Pain? No/denies   Pain Score 0-No pain   Pain Onset 1 to 4  weeks ago      Therex:  recombant bike: x 3 min L5- pt cued on safely getting on / off and counceled about choosing exercise options after DC Leg press: double and single leg 100lbs and 50lbs 3x10 each - education about seat positioning, progressions and choosing weight Hamstring curls/ knee extensions 10lbs each- single leg R:  education about seat positioning, progressions and choosing weight 3x10 Sit to stand with 10lb dumbbell x 10 - cues for safety  lat pull down/ rows 10lbs - assist to hold bar as needed inititally, then pt performed with education about seat positioning, progressions,s afety and choosing weight x 10 each                           PT Education - 11/07/14 1524    Education provided Yes   Education Details gym equipment use including getting on / off, exercise recommondations and progressions   Person(s) Educated Patient   Methods Explanation   Comprehension Verbalized understanding;Returned demonstration             PT Long Term Goals - 11/07/14 1528    PT LONG TERM GOAL #1   Title Patient will be able to stand with eyes closed for >  30 seconds for decreased risk for fall by 6/13//2016   Time 6   Period Weeks   Status Achieved   PT LONG TERM GOAL #2   Title Patient (>40 years old) will complete five times sit to stand test in < 14.2 seconds indicating a decreased likelihood of a balance dysfunction and improved LE strength by 11/28/2014   Time 6   Period Weeks   Status Achieved   PT LONG TERM GOAL #3   Title Patient will be independent with ascent and descent of 13 steps using single UE in step over step pattern without LOB by 11/28/2014.   Time 6   Period Weeks   Status Achieved   PT LONG TERM GOAL #4   Title Patient will improve gait speed to > 1.2 m/s with least restrictive assistive device for safety with indpendent community ambulation by 11/28/2014.   Baseline 11/02/14 1.30m/s   Time 6   Status Partially Met   PT LONG TERM  GOAL #5   Title Patient will be able to ambulate > 600 feet with least restrictive assistive device for safety with indpendent community ambulation by 11/28/2014.   Time 6   Period Weeks   Status Achieved   PT LONG TERM GOAL #6   Title Patient will be able to stand on single leg x 10 seconds to indicate improved standing balance by 11/28/2014.   Time 6   Period Weeks   Status Achieved   PT LONG TERM GOAL #7   Title Patient will ambulate on an incline ascending and descending independently for 100 feet with spc by 11/28/2014.   Time 6   Period Weeks   Status Achieved               Plan - 2014-12-05 1526    Clinical Impression Statement pt has achieved PT goals and is independent with HEP at this time including exercises to be done at the [facility]. pt has made outstanding progress in therapy and has good prognosis for continued independent mobility at home and community with use of AD.pt will be DC from PT at this time and was instructed to call with any questions or concerns.    Rehab Potential Good   PT Frequency 2x / week   PT Duration 8 weeks   PT Treatment/Interventions Manual techniques;Gait training;Ultrasound;Therapeutic exercise;Neuromuscular re-education;Balance training;Therapeutic activities;Patient/family education   Consulted and Agree with Plan of Care Patient          G-Codes - 2014/12/05 1528    Functional Assessment Tool Used 35m walk/ 5x sit to stand/ berg   Functional Limitation Mobility: Walking and moving around   Mobility: Walking and Moving Around Current Status [phone]) At least 1 percent but less than 20 percent impaired, limited or restricted   Mobility: Walking and Moving Around Goal Status [phone]) At least 1 percent but less than 20 percent impaired, limited or restricted   Mobility: Walking and Moving Around Discharge Status [phone]) At least 1 percent but less than 20 percent impaired, limited or restricted      Problem List Patient Active Problem List    Diagnosis Date Noted  . 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 Fatigue 08/23/2014  . Frequent urination 08/23/2014  . Depression due to stroke 07/27/2014  . Intraparenchymal hemorrhage of brain   . Advanced directives, counseling/discussion 02/07/2014  . Routine general medical examination at a health care facility 01/15/2013  . Undiagnosed cardiac murmurs 02/12/2013  .  Osteoarthritis of both knees   . MDD (recurrent major depressive disorder) in remission   . Sudden cardiac death   . BPH (benign prostatic hypertrophy)    PHYSICAL THERAPY DISCHARGE SUMMARY  Visits from Start of Care: 26  Current functional level related to goals / functional outcomes: Independent home mobility, mild impairment, community mobility    Remaining deficits: Strength, motor control on the L extremities, gait, mild balance deficits   Education / Equipment: Pt plans to get a rollator to walk/exercise outdoors Plan: Patient agrees to discharge.  Patient goals were met. Patient is being discharged due to meeting the stated rehab goals.  ?????       Gorden Harms. Jose Corvin, PT, DPT [phone]  Janelie Goltz 11/07/2014, 3:31 PM  [place] MAIN [facility] SERVICES [address] [place], [place], [address] Phone: [phone]   Fax:  [phone]

## 2014-11-07 NOTE — Therapy (Signed)
[place] MAIN [facility] SERVICES [address] [place], [place], [address] Phone: [phone]   Fax:  [phone]  Occupational Therapy Treatment/Discharge Summary  Patient Details  Name: Keith Hall MRN: [id number] Date of Birth: November 28, 1945 Referring Provider:  Venia Carbon, MD  Encounter Date: 11/07/2014      OT End of Session - 11/07/14 1353    Visit Number 24   Number of Visits 24   Date for OT Re-Evaluation 11/07/14   OT Start Time 1300   OT Stop Time 1345   OT Time Calculation (min) 45 min   Activity Tolerance Patient tolerated treatment well   Behavior During Therapy [facility] for tasks assessed/performed      Past Medical History  Diagnosis Date  . Osteoarthritis of both knees   . Depression 1980's2004/02/18    MDD---even needed hospitalization  . Kidney stone 1980 only  . Sudden cardiac death 1999/08/05    no clear etiology of this  . BPH (benign prostatic hypertrophy)   . 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 Cardiovascular stress test  07/09/11    Negative--Dr Paraschos  . Cardiac catheterization  last in 2007/08/05    insignificant coronary disease  . Hernia repair  04-Aug-2010    left inguinal--Dr Byrnett  . Rotator cuff repair  05-Aug-2007    both done by Dr Margaretmary Eddy  . 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 Total knee arthroplasty Left 1/15    Dr [facility]    There were no vitals filed for this visit.  Visit Diagnosis:  Coordination disorder  Muscular tone excessive      Subjective Assessment - 11/07/14 1313    Subjective  Patient pleased with his treatment    This patient has met 5 of 7 goals and progress in the last 2. Patient did not complete 9 hole peg test in 100 seconds or less but can button buttons. He uses the computer with one hand. He can now open medicine bottles using dycem. He can now fasten his  belt. He can now shave. He does not need to grip the walker because he has progressed to a cane. He can cut meat with a built up handle for fork. Neuromuscular re-education: Placed 1/4 inch pegs in peg board. Pulled velcro checkers off checker board alternating fingers.                           OT Education - 11/07/14 1352    Education provided Yes   Education Details Instructed to keep using hand and use weight bearing to help normalize tone.   Person(s) Educated Patient   Methods Explanation   Comprehension Verbalized understanding             OT Long Term Goals - 11/07/14 1358    OT LONG TERM GOAL #1   Status Partially Met               Plan - 11/07/14 1355    Clinical Impression Statement Patient is now performing most of his basic ADL independently or with supervision only.   Pt will benefit from skilled therapeutic intervention in order to improve on the following deficits (Retired) Impaired UE functional use;Impaired tone;Pain   Rehab Potential Good   Clinical Impairments Affecting Rehab Potential R  UE spasticity    OT Treatment/Interventions Self-care/ADL training;Neuromuscular education   OT Home Exercise Plan Continues with weight bearing and fine motor    Consulted and Agree with Plan of Care Patient          G-Codes - 28-Nov-2014 1332    Self Care Goal Status ([id number]) At least 1 percent but less than 20 percent impaired, limited or restricted      Problem List Patient Active Problem List   Diagnosis Date Noted  . 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 Fatigue 08/23/2014  . Frequent urination 08/23/2014  . Depression due to stroke 07/27/2014  . Intraparenchymal hemorrhage of brain   . Advanced directives, counseling/discussion 02/07/2014  . Routine general medical examination at a health care facility 02/13/2013  . Undiagnosed cardiac murmurs 01/30/2013  . Osteoarthritis of  both knees   . MDD (recurrent major depressive disorder) in remission   . Sudden cardiac death   . BPH (benign prostatic hypertrophy)    Sharon Mt, MS/OTR/L Sharon Mt 11-28-14, 2:03 PM  [place] MAIN [facility] SERVICES [address] [place], [place], [address] Phone: [phone]   Fax:  [phone]

## 2014-11-26 ENCOUNTER — Other Ambulatory Visit: Payer: Self-pay | Admitting: Internal Medicine

## 2014-11-28 NOTE — Telephone Encounter (Signed)
rx called into pharmacy

## 2014-11-28 NOTE — Telephone Encounter (Signed)
plz phone in. 

## 2014-11-28 NOTE — Telephone Encounter (Signed)
Last filled 09/27/14  LETVAK PATIENT, Please send back to me for call in

## 2014-11-29 ENCOUNTER — Encounter: Payer: Self-pay | Admitting: Internal Medicine

## 2014-11-29 ENCOUNTER — Ambulatory Visit (INDEPENDENT_AMBULATORY_CARE_PROVIDER_SITE_OTHER): Payer: PPO | Admitting: Internal Medicine

## 2014-11-29 VITALS — BP 120/70 | HR 95 | Temp 97.8°F | Wt 199.0 lb

## 2014-11-29 DIAGNOSIS — F329 Major depressive disorder, single episode, unspecified: Secondary | ICD-10-CM

## 2014-11-29 DIAGNOSIS — IMO0002 Reserved for concepts with insufficient information to code with codable children: Secondary | ICD-10-CM

## 2014-11-29 DIAGNOSIS — I69359 Hemiplegia and hemiparesis following cerebral infarction affecting unspecified side: Secondary | ICD-10-CM

## 2014-11-29 DIAGNOSIS — I619 Nontraumatic intracerebral hemorrhage, unspecified: Secondary | ICD-10-CM

## 2014-11-29 DIAGNOSIS — I6350 Cerebral infarction due to unspecified occlusion or stenosis of unspecified cerebral artery: Secondary | ICD-10-CM

## 2014-11-29 DIAGNOSIS — I69859 Hemiplegia and hemiparesis following other cerebrovascular disease affecting unspecified side: Secondary | ICD-10-CM | POA: Diagnosis not present

## 2014-11-29 NOTE — Assessment & Plan Note (Signed)
No follow up with neuro planned now--last visit last month

## 2014-11-29 NOTE — Assessment & Plan Note (Signed)
Some improvement as he is getting out more Would not increase the citalopram for now

## 2014-11-29 NOTE — Progress Notes (Signed)
Pre visit review using our clinic review tool, if applicable. No additional management support is needed unless otherwise documented below in the visit note. 

## 2014-11-29 NOTE — Progress Notes (Signed)
Subjective:    Patient ID: Keith Hall, male    DOB: 05/01/1946, 69 y.o.   MRN: [id number]  HPI Here for follow up after stroke Here with wife  BP has stayed okay off the carvedilol Still has trouble with energy-- feels "blah"  Has to rest after a while--but staying active Goes to gym in AM and then goes out with friends for breakfast Then will need to rest Has come a long way since the stroke  Still feelings of depression every day Did just start going out with friends--does enjoy that  Still frustrated by left hand--- "it has a mind of its own" Left leg is improved Still has to be careful with balance--- uses cane   No chest pain  No SOB No dizziness or syncope  Current Outpatient Prescriptions on File Prior to Visit  Medication Sig Dispense Refill  . acetaminophen (TYLENOL) 500 MG tablet Take 500 mg by mouth 3 (three) times daily as needed.     Marland Kitchen aspirin 325 MG EC tablet Take 1 tablet (325 mg total) by mouth daily. 90 tablet 1  . atorvastatin (LIPITOR) 40 MG tablet Take 1 tablet (40 mg total) by mouth daily. 90 tablet 1  . citalopram (CELEXA) 10 MG tablet Take 1 tablet (10 mg total) by mouth 2 (two) times daily. 180 tablet 1  . clonazePAM (KLONOPIN) 0.5 MG tablet TAKE 1/2 - 1 TABLET EVERY DAY AT BEDTIME AS NEEDED FOR ANXIETY 30 tablet 0  . Glucosamine-Chondroitin (MOVE FREE PO) Take 2 tablets by mouth daily.    . Multiple Vitamin (MULTI-VITAMINS) TABS Take 1 tablet by mouth daily.      No current facility-administered medications on file prior to visit.    Allergies  Allergen Reactions  . Penicillins Swelling    Past Medical History  Diagnosis Date  . Osteoarthritis of both knees   . Depression 1980's04-24-2004    MDD---even needed hospitalization  . Kidney stone 1980 only  . Sudden cardiac death 10/09/1999    no clear etiology of this  . BPH (benign prostatic hypertrophy)   . 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 and rehab      Past Surgical History  Procedure Laterality Date  . Cardiovascular stress test  07/09/11    Negative--Dr Paraschos  . Cardiac catheterization  last in 2007-10-09    insignificant coronary disease  . Hernia repair  2010-10-09    left inguinal--Dr Byrnett  . Rotator cuff repair  10-09-07    both done by Dr Margaretmary Eddy  . 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 Total knee arthroplasty Left 1/15    Dr [facility]    Family History  Problem Relation Age of Onset  . Stroke Mother   . COPD Sister   . Heart disease Neg Hx   . Hypertension Neg Hx   . Diabetes Son     Type 1    History   Social History  . Marital Status: Married    Spouse Name: N/A  . Number of Children: 1  . Years of Education: N/A   Occupational History  . Outside sales of furniture     Retired at [employer]  . Smoking status: Never Smoker   . Smokeless tobacco: Never Used  . Alcohol Use: No  . Drug Use: No  . Sexual Activity: Not on file   Other Topics Concern  .  Not on file   Social History Narrative   1 son   Has living will   Wife, then son, have health care POA   Would accept resuscitation attempts but no prolonged artificial ventilation   Would not want tube feeds if cognitively unaware   Review of Systems Still not sleeping great--does take the clonazepam nightly around 9 Will awaken 2-3 AM---not always able to get back to sleep Appetite is good Weight stable    Objective:   Physical Exam  Constitutional: He appears well-developed. No distress.  Neck: Normal range of motion. Neck supple. No thyromegaly present.  Cardiovascular: Normal rate and regular rhythm.  Exam reveals no gallop.   Gr 3/6 systolic murmur--sounds aortic  Pulmonary/Chest: Effort normal and breath sounds normal. No respiratory distress. He has no wheezes. He has no rales.  Lymphadenopathy:    He has no cervical adenopathy.  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 His behavior is normal.          Assessment & Plan:

## 2014-11-29 NOTE — Assessment & Plan Note (Signed)
Ongoing left UE issues but generally improved

## 2015-01-11 ENCOUNTER — Other Ambulatory Visit: Payer: Self-pay | Admitting: Internal Medicine

## 2015-01-19 ENCOUNTER — Other Ambulatory Visit: Payer: Self-pay | Admitting: Internal Medicine

## 2015-01-19 NOTE — Telephone Encounter (Signed)
Called pt to see why he asked for a refill of Gabapentin? This was stopped in 07/2014 after his stroke he got refill per [facility] on 07/21/2014 for #30 written by Dr. Ouida Sills. I spoke with wife and she states pt was taking this at [facility] back in January? Pt was on 100mg  and 300mg , the 100mg  had refills written by Dr. Lawana Pai in May and was refilled today. Wife is asking for more of the 300mg  filled , I advised that pt hasn't taken this since February and asked if anything was going on, she just stated he was taking this and she want's him back on it. Please advise

## 2015-01-19 NOTE — Telephone Encounter (Signed)
Approved: probably just a misunderstanding with the med reconciliation If he has been on it, refill for a year

## 2015-01-20 NOTE — Telephone Encounter (Signed)
rx sent to pharmacy by e-script  

## 2015-02-03 ENCOUNTER — Other Ambulatory Visit: Payer: Self-pay | Admitting: Internal Medicine

## 2015-02-03 NOTE — Telephone Encounter (Signed)
rx called into pharmacy

## 2015-02-03 NOTE — Telephone Encounter (Signed)
Approved: okay #30 x 0 

## 2015-02-03 NOTE — Telephone Encounter (Signed)
11/28/2014 

## 2015-03-07 ENCOUNTER — Telehealth: Payer: Self-pay | Admitting: Internal Medicine

## 2015-03-07 DIAGNOSIS — I69359 Hemiplegia and hemiparesis following cerebral infarction affecting unspecified side: Secondary | ICD-10-CM

## 2015-03-07 NOTE — Telephone Encounter (Signed)
Order placed

## 2015-03-07 NOTE — Telephone Encounter (Signed)
Spouse called needed a referral to [facility] [employer] for PT.  They take pt that insurance no longer pays for PT  The referral has to say he needs PT for left side.  Can't use left hand and help him to walk better She has notice since not doing PT he has lost some strength when walking   Fax number to [place] Phone # [phone]

## 2015-04-03 ENCOUNTER — Telehealth: Payer: Self-pay

## 2015-04-03 NOTE — Telephone Encounter (Signed)
June DPR signed wanted to clarify few questions about meds; June wanted to verify pt is to take citalopram 10 mg one tab bid. Verified that is correct per med list. June also wants to know when carvedilol was stopped. Carvedilol was stopped per office note on 09/28/14. June voiced understanding. Pt has not been taking carvedilol June just wanted to know date stopped. Nothing further needed.

## 2015-04-06 ENCOUNTER — Telehealth: Payer: Self-pay | Admitting: Internal Medicine

## 2015-04-06 NOTE — Telephone Encounter (Signed)
Pts spouse dropped off handicap placard to be completed. Please mail back form when done. Form in Dr. Milbert Coulter box.  If needed best call back # is [phone].

## 2015-04-06 NOTE — Telephone Encounter (Signed)
Form done No charge 

## 2015-04-06 NOTE — Telephone Encounter (Signed)
Form on your desk  

## 2015-04-13 NOTE — Telephone Encounter (Signed)
I was off last Thursday and Friday. It could have gone to [place] or [place].

## 2015-04-13 NOTE — Telephone Encounter (Signed)
Morey Hummingbird do you remember if this form might have been given to you too mail out? I do not have the form to return to patient.

## 2015-04-19 ENCOUNTER — Other Ambulatory Visit: Payer: Self-pay | Admitting: Internal Medicine

## 2015-04-19 NOTE — Telephone Encounter (Signed)
Called Mrs. Rattigan and confirmed they had received form in the mail.

## 2015-04-20 NOTE — Telephone Encounter (Signed)
Last filled 02/03/15 #30--please advise

## 2015-04-20 NOTE — Telephone Encounter (Signed)
Approved: 30 x 0 

## 2015-04-28 ENCOUNTER — Other Ambulatory Visit: Payer: Self-pay | Admitting: Internal Medicine

## 2015-05-23 ENCOUNTER — Ambulatory Visit (INDEPENDENT_AMBULATORY_CARE_PROVIDER_SITE_OTHER): Payer: PPO | Admitting: Internal Medicine

## 2015-05-23 ENCOUNTER — Encounter: Payer: Self-pay | Admitting: Internal Medicine

## 2015-05-23 VITALS — BP 120/70 | HR 78 | Temp 98.0°F | Ht 69.0 in | Wt 206.0 lb

## 2015-05-23 DIAGNOSIS — Z0000 Encounter for general adult medical examination without abnormal findings: Secondary | ICD-10-CM | POA: Diagnosis not present

## 2015-05-23 DIAGNOSIS — Z7189 Other specified counseling: Secondary | ICD-10-CM

## 2015-05-23 DIAGNOSIS — I639 Cerebral infarction, unspecified: Secondary | ICD-10-CM

## 2015-05-23 DIAGNOSIS — F0631 Mood disorder due to known physiological condition with depressive features: Secondary | ICD-10-CM

## 2015-05-23 DIAGNOSIS — IMO0001 Reserved for inherently not codable concepts without codable children: Secondary | ICD-10-CM

## 2015-05-23 DIAGNOSIS — F331 Major depressive disorder, recurrent, moderate: Secondary | ICD-10-CM | POA: Diagnosis not present

## 2015-05-23 DIAGNOSIS — I469 Cardiac arrest, cause unspecified: Secondary | ICD-10-CM

## 2015-05-23 DIAGNOSIS — IMO0002 Reserved for concepts with insufficient information to code with codable children: Secondary | ICD-10-CM

## 2015-05-23 DIAGNOSIS — I700 Atherosclerosis of aorta: Secondary | ICD-10-CM

## 2015-05-23 DIAGNOSIS — I69359 Hemiplegia and hemiparesis following cerebral infarction affecting unspecified side: Secondary | ICD-10-CM

## 2015-05-23 LAB — COMPREHENSIVE METABOLIC PANEL
ALT: 58 U/L — ABNORMAL HIGH (ref 0–53)
AST: 63 U/L — AB (ref 0–37)
Albumin: 4.4 g/dL (ref 3.5–5.2)
Alkaline Phosphatase: 80 U/L (ref 39–117)
BUN: 14 mg/dL (ref 6–23)
CALCIUM: 9.6 mg/dL (ref 8.4–10.5)
CHLORIDE: 105 meq/L (ref 96–112)
CO2: 32 meq/L (ref 19–32)
CREATININE: 0.85 mg/dL (ref 0.40–1.50)
GFR: 94.29 mL/min (ref >60.00)
Glucose, Bld: 77 mg/dL (ref 70–99)
POTASSIUM: 4.2 meq/L (ref 3.5–5.1)
Sodium: 142 mEq/L (ref 135–145)
Total Bilirubin: 0.9 mg/dL (ref 0.2–1.2)
Total Protein: 7.2 g/dL (ref 6.0–8.3)

## 2015-05-23 LAB — CBC WITH DIFFERENTIAL/PLATELET
BASOS PCT: 0.5 % (ref 0.0–3.0)
Basophils Absolute: 0 10*3/uL (ref 0.0–0.1)
EOS PCT: 2.3 % (ref 0.0–5.0)
Eosinophils Absolute: 0.1 10*3/uL (ref 0.0–0.7)
HEMATOCRIT: 44.5 % (ref 39.0–52.0)
HEMOGLOBIN: 14.9 g/dL (ref 13.0–17.0)
LYMPHS PCT: 12.9 % (ref 12.0–46.0)
Lymphs Abs: 0.8 10*3/uL (ref 0.7–4.0)
MCHC: 33.4 g/dL (ref 30.0–36.0)
MCV: 90.7 fl (ref 78.0–100.0)
MONOS PCT: 11.5 % (ref 3.0–12.0)
Monocytes Absolute: 0.7 10*3/uL (ref 0.1–1.0)
NEUTROS ABS: 4.5 10*3/uL (ref 1.4–7.7)
Neutrophils Relative %: 72.8 % (ref 43.0–77.0)
PLATELETS: 136 10*3/uL — AB (ref 150.0–400.0)
RBC: 4.9 Mil/uL (ref 4.22–5.81)
RDW: 13.2 % (ref 11.5–15.5)
WBC: 6.3 10*3/uL (ref 4.0–10.5)

## 2015-05-23 LAB — LIPID PANEL
CHOL/HDL RATIO: 3
Cholesterol: 129 mg/dL (ref 0–200)
HDL: 47.2 mg/dL (ref >39.00)
LDL CALC: 63 mg/dL (ref 0–99)
NonHDL: 82.17
Triglycerides: 97 mg/dL (ref 0.0–149.0)
VLDL: 19.4 mg/dL (ref 0.0–40.0)

## 2015-05-23 LAB — T4, FREE: Free T4: 0.8 ng/dL (ref 0.60–1.60)

## 2015-05-23 MED ORDER — CITALOPRAM HYDROBROMIDE 20 MG PO TABS: 20.0000 mg | ORAL_TABLET | Freq: Every day | ORAL | Status: DC

## 2015-05-23 NOTE — Patient Instructions (Signed)
Please increase the citalopram to 20mg  daily. You can take 2 of the 10mg  dose till they run out ( and I sent a new prescription for you)

## 2015-05-23 NOTE — Assessment & Plan Note (Signed)
See social history 

## 2015-05-23 NOTE — Progress Notes (Signed)
Pre visit review using our clinic review tool, if applicable. No additional management support is needed unless otherwise documented below in the visit note. 

## 2015-05-23 NOTE — Progress Notes (Signed)
Subjective:    Patient ID: Keith Hall, male    DOB: August 07, 1945, 69 y.o.   MRN: [id number]  HPI Here for Medicare wellness and follow up of chronic medical conditions Reviewed form and advanced directives Reviewed other doctors--none other than eye doctor No alcohol or tobacco Vision is okay Some hearing problems---"it is terrible" since stroke Several falls-- no significant injuries Needs help with instrumental ADLs and occasionally with ADLs Mild memory changes---nothing major  Back to driving himself--but came with wife Takes a while--but he can do ADLs but it takes a long time Left hand and arm still numb but improved function.  Can't trust it unless he is looking directly at it [facility] with cane Going to PT weekly at [facility] still not good Depressed mood every day Not much that he enjoys Feels "I am a burden on my wife" "I just don't care about anything." thinks about suicide regularly--but no plan  No chest pain No SOB No syncope. Occasional dizziness when getting up--passes quickly No edema  Voids okay Stable nocturia x 1  Some daytime urgency--but no major issues  Current Outpatient Prescriptions on File Prior to Visit  Medication Sig Dispense Refill  . acetaminophen (TYLENOL) 500 MG tablet Take 500 mg by mouth 3 (three) times daily as needed.     Marland Kitchen aspirin 325 MG EC tablet Take 1 tablet (325 mg total) by mouth daily. 90 tablet 1  . atorvastatin (LIPITOR) 40 MG tablet Take 1 tablet by mouth daily 90 tablet 0  . citalopram (CELEXA) 10 MG tablet Take 1 tablet by mouth twice a day 180 tablet 0  . clonazePAM (KLONOPIN) 0.5 MG tablet TAKE 1/2 - 1 TABLET EVERY DAY AT BEDTIME AS NEEDED FOR ANXIETY 30 tablet 0  . gabapentin (NEURONTIN) 300 MG capsule TAKE 1 CAPSULE BY MOUTH AT BEDTIME 90 capsule 3  . Glucosamine-Chondroitin (MOVE FREE PO) Take 2 tablets by mouth daily.    . Multiple Vitamin (MULTI-VITAMINS) TABS Take 1 tablet by mouth daily.      No current  facility-administered medications on file prior to visit.    Allergies  Allergen Reactions  . Penicillins Swelling    Past Medical History  Diagnosis Date  . Osteoarthritis of both knees   . Depression 1980'sApril 04, 2004    MDD---even needed hospitalization  . Kidney stone 1980 only  . Sudden cardiac death [facility]) 1999-09-19    no clear etiology of this  . BPH (benign prostatic hypertrophy)   . 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 Cardiovascular stress test  07/09/11    Negative--Dr Paraschos  . Cardiac catheterization  last in 2007-09-19    insignificant coronary disease  . Hernia repair  09-19-2010    left inguinal--Dr Byrnett  . Rotator cuff repair  19-Sep-2007    both done by Dr Margaretmary Eddy  . 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 Total knee arthroplasty Left 1/15    Dr [facility]    Family History  Problem Relation Age of Onset  . Stroke Mother   . COPD Sister   . Heart disease Neg Hx   . Hypertension Neg Hx   . Diabetes Son     Type 1    Social History   Social History  . Marital Status: Married    Spouse Name: N/A  .  Number of Children: 1  . Years of Education: N/A   Occupational History  . Outside sales of furniture     Retired at [employer]  . Smoking status: Never Smoker   . Smokeless tobacco: Never Used  . Alcohol Use: No  . Drug Use: No  . Sexual Activity: Not on file   Other Topics Concern  . Not on file   Social History Narrative   1 son   Has living will   Wife, then son, have health care POA   Would accept resuscitation attempts but no prolonged artificial ventilation   Would not want tube feeds if cognitively unaware   Review of Systems Not sleeping well Tired all the time Appetite is okay Weight up a few pounds Teeth okay--some tight feeling in left jaw (?from stroke) Wears seat belt Bowels are  okay Satisfied with tylenol for arthritis No suspicious skin lesions    Objective:   Physical Exam  Constitutional: He is oriented to person, place, and time. He appears well-developed and well-nourished. No distress.  HENT:  Mouth/Throat: Oropharynx is clear and moist. No oropharyngeal exudate.  Neck: Normal range of motion. Neck supple. No thyromegaly present.  Cardiovascular: Normal rate, regular rhythm and intact distal pulses.  Exam reveals no gallop.   Gr 3/6 aortic systolic murmur  Pulmonary/Chest: Effort normal. No respiratory distress. He has no wheezes. He has no rales.  Abdominal: Soft. There is no tenderness.  Musculoskeletal: He exhibits no edema.  Lymphadenopathy:    He has no cervical adenopathy.  Neurological: He is alert and oriented to person, place, and time.  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 No erythema.  Psychiatric:  Normal speech, appearance and interaction. Voices ongoing depression Appropriate affect          Assessment & Plan:

## 2015-05-23 NOTE — Assessment & Plan Note (Signed)
I have personally reviewed the Medicare Annual Wellness questionnaire and have noted 1. The patient's medical and social history 2. Their use of alcohol, tobacco or illicit drugs 3. Their current medications and supplements 4. The patient's functional ability including ADL's, fall risks, home safety risks and hearing or visual             impairment. 5. Diet and physical activities 6. Evidence for depression or mood disorders  The patients weight, height, BMI and visual acuity have been recorded in the chart I have made referrals, counseling and provided education to the patient based review of the above and I have provided the pt with a written personalized care plan for preventive services.  I have provided you with a copy of your personalized plan for preventive services. Please take the time to review along with your updated medication list.  UTD on imms No PSA due to age Colon to be considered again in 2023

## 2015-05-23 NOTE — Assessment & Plan Note (Signed)
This exacerbated underlying depressive disorder

## 2015-05-23 NOTE — Assessment & Plan Note (Signed)
Many years ago Full cardiac workup negative and no active ischemic symptoms now No further evaluation now

## 2015-05-23 NOTE — Assessment & Plan Note (Signed)
Voices daily depressed mood, anhedonia and desire to die (though no suicide plan) Will increase the citalopram and see back in 1 month

## 2015-05-23 NOTE — Assessment & Plan Note (Signed)
Has reached steady state now Mild deficits which he has gotten used to--though terribly bothered by On statin and asa

## 2015-05-23 NOTE — Assessment & Plan Note (Signed)
Vs stenosis No symptoms so will hold off on evaluation

## 2015-05-24 ENCOUNTER — Other Ambulatory Visit: Payer: Self-pay | Admitting: Internal Medicine

## 2015-05-24 DIAGNOSIS — R945 Abnormal results of liver function studies: Secondary | ICD-10-CM

## 2015-06-26 ENCOUNTER — Ambulatory Visit: Payer: PPO | Admitting: Internal Medicine

## 2015-06-30 ENCOUNTER — Other Ambulatory Visit: Payer: Self-pay | Admitting: Internal Medicine

## 2015-07-04 ENCOUNTER — Encounter: Payer: Self-pay | Admitting: Internal Medicine

## 2015-07-04 ENCOUNTER — Other Ambulatory Visit: Payer: Self-pay | Admitting: Internal Medicine

## 2015-07-04 ENCOUNTER — Ambulatory Visit (INDEPENDENT_AMBULATORY_CARE_PROVIDER_SITE_OTHER): Payer: PPO | Admitting: Internal Medicine

## 2015-07-04 VITALS — BP 128/80 | HR 67 | Temp 97.9°F | Wt 202.0 lb

## 2015-07-04 DIAGNOSIS — D696 Thrombocytopenia, unspecified: Secondary | ICD-10-CM

## 2015-07-04 DIAGNOSIS — R945 Abnormal results of liver function studies: Secondary | ICD-10-CM

## 2015-07-04 DIAGNOSIS — F331 Major depressive disorder, recurrent, moderate: Secondary | ICD-10-CM

## 2015-07-04 DIAGNOSIS — R7989 Other specified abnormal findings of blood chemistry: Secondary | ICD-10-CM

## 2015-07-04 LAB — HEPATIC FUNCTION PANEL
ALBUMIN: 4.4 g/dL (ref 3.5–5.2)
ALT: 60 U/L — ABNORMAL HIGH (ref 0–53)
AST: 67 U/L — ABNORMAL HIGH (ref 0–37)
Alkaline Phosphatase: 76 U/L (ref 39–117)
Bilirubin, Direct: 0.2 mg/dL (ref 0.0–0.3)
Total Bilirubin: 0.9 mg/dL (ref 0.2–1.2)
Total Protein: 7.2 g/dL (ref 6.0–8.3)

## 2015-07-04 NOTE — Progress Notes (Signed)
Subjective:    Patient ID: Keith Hall, male    DOB: 04-14-1946, 70 y.o.   MRN: [id number]  HPI Here with wife to review mood Much better on increased citalopram Not snapping as much Smiling more and enjoying life again Appetite is fine--surprised by slight weight loss  Still frustrated by stroke disability Not as overwhelming  Discussed borderline liver tests Will recheck  No abnormal bleeding or bruising Reviewed that platelets are just borderline low  Current Outpatient Prescriptions on File Prior to Visit  Medication Sig Dispense Refill  . acetaminophen (TYLENOL) 500 MG tablet Take 500 mg by mouth 3 (three) times daily as needed.     Marland Kitchen aspirin 325 MG EC tablet Take 1 tablet (325 mg total) by mouth daily. 90 tablet 1  . atorvastatin (LIPITOR) 40 MG tablet Take 1 tablet by mouth once daily 90 tablet 0  . citalopram (CELEXA) 20 MG tablet Take 1 tablet (20 mg total) by mouth daily. 90 tablet 3  . clonazePAM (KLONOPIN) 0.5 MG tablet TAKE 1/2 - 1 TABLET EVERY DAY AT BEDTIME AS NEEDED FOR ANXIETY 30 tablet 0  . gabapentin (NEURONTIN) 300 MG capsule TAKE 1 CAPSULE BY MOUTH AT BEDTIME 90 capsule 3  . Glucosamine-Chondroitin (MOVE FREE PO) Take 2 tablets by mouth daily.     No current facility-administered medications on file prior to visit.    Allergies  Allergen Reactions  . Penicillins Swelling    Past Medical History  Diagnosis Date  . Osteoarthritis of both knees   . Depression 1980'sApril 17, 2004    MDD---even needed hospitalization  . Kidney stone 1980 only  . Sudden cardiac death [facility]) Oct 02, 1999    no clear etiology of this  . BPH (benign prostatic hypertrophy)   . 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 Cardiovascular stress test  07/09/11    Negative--Dr Paraschos  . Cardiac catheterization  last in Oct 02, 2007    insignificant coronary disease  . Hernia repair   02-Oct-2010    left inguinal--Dr Byrnett  . Rotator cuff repair  10-02-2007    both done by Dr Margaretmary Eddy  . 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 Total knee arthroplasty Left 1/15    Dr [facility]    Family History  Problem Relation Age of Onset  . Stroke Mother   . COPD Sister   . Heart disease Neg Hx   . Hypertension Neg Hx   . Diabetes Son     Type 1    Social History   Social History  . Marital Status: Married    Spouse Name: N/A  . Number of Children: 1  . Years of Education: N/A   Occupational History  . Outside sales of furniture     Retired at [employer]  . Smoking status: Never Smoker   . Smokeless tobacco: Never Used  . Alcohol Use: No  . Drug Use: No  . Sexual Activity: Not on file   Other Topics Concern  . Not on file   Social History Narrative   1 son   Has living will   Wife, then son, have health care POA   Would accept resuscitation attempts but no prolonged artificial ventilation   Would not want tube feeds if cognitively unaware   Review of Systems Has tried ice  on left hand--feels that helps some Still has numbness and will drop things from his left hand--still some weakness    Objective:   Physical Exam  Psychiatric:  More upbeat than he has been since his stroke          Assessment & Plan:

## 2015-07-04 NOTE — Assessment & Plan Note (Signed)
Much better on increased citalopram Doing better with nighttime dose--got him sleepy in day

## 2015-07-04 NOTE — Assessment & Plan Note (Signed)
Borderline and no symptoms Will recheck at next appt

## 2015-07-04 NOTE — Progress Notes (Signed)
Pre visit review using our clinic review tool, if applicable. No additional management support is needed unless otherwise documented below in the visit note. 

## 2015-07-04 NOTE — Assessment & Plan Note (Signed)
Borderline Will recheck and hep c just in case

## 2015-07-05 LAB — HEPATITIS C ANTIBODY: HCV Ab: NEGATIVE

## 2015-07-10 ENCOUNTER — Other Ambulatory Visit: Payer: Self-pay

## 2015-07-10 NOTE — Telephone Encounter (Signed)
June (DPR signed)left v/m requesting [facility]. Last refilled # 30 on 02/03/15. Last seen 07/04/15.Please advise.

## 2015-07-10 NOTE — Telephone Encounter (Signed)
Approved: 30 x 0 

## 2015-07-11 MED ORDER — CLONAZEPAM 0.5 MG PO TABS: ORAL_TABLET | ORAL | Status: DC

## 2015-07-11 NOTE — Telephone Encounter (Signed)
Pt's wife want this for a 90 day supply? [facility] for #90?

## 2015-07-11 NOTE — Telephone Encounter (Signed)
yes

## 2015-07-11 NOTE — Telephone Encounter (Signed)
rx faxed to pharmacy manually [facility]

## 2015-07-31 DIAGNOSIS — Z9889 Other specified postprocedural states: Secondary | ICD-10-CM | POA: Diagnosis not present

## 2015-07-31 DIAGNOSIS — E782 Mixed hyperlipidemia: Secondary | ICD-10-CM | POA: Diagnosis not present

## 2015-07-31 DIAGNOSIS — R0789 Other chest pain: Secondary | ICD-10-CM | POA: Diagnosis not present

## 2015-07-31 DIAGNOSIS — Z8679 Personal history of other diseases of the circulatory system: Secondary | ICD-10-CM | POA: Diagnosis not present

## 2015-07-31 DIAGNOSIS — I469 Cardiac arrest, cause unspecified: Secondary | ICD-10-CM | POA: Diagnosis not present

## 2015-08-10 DIAGNOSIS — R0789 Other chest pain: Secondary | ICD-10-CM | POA: Diagnosis not present

## 2015-08-17 DIAGNOSIS — E782 Mixed hyperlipidemia: Secondary | ICD-10-CM | POA: Diagnosis not present

## 2015-08-17 DIAGNOSIS — R079 Chest pain, unspecified: Secondary | ICD-10-CM | POA: Diagnosis not present

## 2015-08-17 DIAGNOSIS — Z9889 Other specified postprocedural states: Secondary | ICD-10-CM | POA: Diagnosis not present

## 2015-08-17 DIAGNOSIS — I469 Cardiac arrest, cause unspecified: Secondary | ICD-10-CM | POA: Diagnosis not present

## 2015-08-17 DIAGNOSIS — Z8679 Personal history of other diseases of the circulatory system: Secondary | ICD-10-CM | POA: Diagnosis not present

## 2015-09-22 ENCOUNTER — Other Ambulatory Visit: Payer: Self-pay | Admitting: *Deleted

## 2015-09-22 ENCOUNTER — Telehealth: Payer: Self-pay | Admitting: Internal Medicine

## 2015-09-22 ENCOUNTER — Encounter: Payer: Self-pay | Admitting: *Deleted

## 2015-09-22 NOTE — Patient Outreach (Addendum)
[facility]) Care Management  09/22/2015  Keith Hall 1945-12-26 [id number]   Subjective: Telephone call to patient's home number, spoke with patient, patient's wife, and HIPAA verified.  Patient gave [facility] verbal authorization to speak with wife (Keith Hall) regarding his healthcare needs as needed.   Discussed [facility] Care Management services and patient's wife in  agreement to complete telephone screen.   Patient's wife decline services at this time on patient's behalf, states she is in agreement to receive [facility] Care Management program information, will discuss information with patient when received and call [facility] Care Management if services needed.  Patient's wife voiced concerns regarding how RNCM obtained patient's information.   RNCM advised patient's wife of [facility] relationship with Dr. Silvio Pate, [place], and [employer].   Patient's wife voiced understanding and states she will review [facility] Care Management information when received.   Patient's wife states patient has had 3 falls with injury over the last 3 months.   Patient ambulates with a straight cane. Patient's wife states patient may need assistance with coordination of  physical therapy due to ongoing disability related to previous stroke.  Wife declined [place] referral at this time for home safety evaluation and care coordination.   Patient's wife given RNCM's contact number, [facility] Care Management main phone number, and 24 hour Nurse Advice line number for future reference. Telephone call to patient's primary MD's office(Dr. Silvio Pate), spoke with Melissa, and HIPAA verified.   Melissa states Dr. Alla German assistance is not currently available and will leave her a message to call RNCM, regarding patient update.  Objective: Per chart review: Patient has history of depression, OA of bilateral knees, and BPH.   Assessment: Received [facility] Tier 3 list referral on 09/18/15.   0 Admissions and 3 ER visits.   No  diagnosis listed.   Patient's wife refused [facility] Care Management services on patient's behalf.   Plan:  RNCM will send patient successful outreach letter, [place] pamphlet, and magnet. RNCM will send patient's primary MD case closure letter due to refusal of services. RNCM will send case closure letter due to refusal of services request to Josepha Pigg at [facility].    Lenea Bywater H. Annia Friendly, BSN, [facility] Telephonic CM Phone: [phone] Fax: [phone]

## 2015-09-22 NOTE — Telephone Encounter (Signed)
Please call thn rep for an update on pt to gove to Dr Silvio Pate. cb number is  [phone] for today only  Call on mon [phone] Thanks

## 2015-09-22 NOTE — Telephone Encounter (Signed)
Please see what this is about

## 2015-09-25 ENCOUNTER — Other Ambulatory Visit: Payer: Self-pay | Admitting: *Deleted

## 2015-09-25 NOTE — Telephone Encounter (Signed)
Darlene at [facility]. She said they spoke to the patient and his wife on Friday. He showed up on their High Risk Patient list for [employer]. The wife was hesitant to provide information because [facility] just called out of the blue. The wife did say he had fallen 3 times in the last few months. Darlene said she sent a letter to Dr Silvio Pate with the patient's current situation. Just wanted [place] to know they reached out to the patient and they declided the services at the time because Carlyon Shadow feels they did not understand [facility] and how they had the patient's information. She also mailed the patient some information about [facility]. Dr Silvio Pate can decide if a referral is appropriate to [facility] and we will move from there.

## 2015-09-25 NOTE — Telephone Encounter (Signed)
Spoke to [employer] at [facility].

## 2015-09-25 NOTE — Patient Outreach (Signed)
[facility] [facility]) Care Management  09/25/2015  KAWASKI ART 31-May-1946 [id number]  Subjective: Telephone call from patient's primary MD's office.   Spoke with Larene Beach at Dr. Alla German office and HIPAA verified.   RNCM advised Larene Beach of conversation with patient and patient's wife on 09/22/15, refusal of [facility] Care Management services, history of falls, concerns regarding HIPAA,  and possible care coordination needs.   Larene Beach states she will provide Dr. Silvio Pate with update and follow up with patient if MD feels appropriate.    Objective: Per chart review: Patient has history of depression, OA of bilateral knees, and BPH.   Assessment: Received HTA Tier 3 list referral on 09/18/15. 0 Admissions and 3 ER visits. No diagnosis listed. Patient's wife refused [facility] Care Management services on patient's behalf.   Plan: No further Telephonic RNCM needs at this time.    Deni Lefever H. Annia Friendly, BSN, [place] [facility] Telephonic CM Phone: [phone] Fax: [phone]

## 2015-10-17 ENCOUNTER — Other Ambulatory Visit: Payer: Self-pay

## 2015-10-17 MED ORDER — CLONAZEPAM 0.5 MG PO TABS: ORAL_TABLET | ORAL | Status: DC

## 2015-10-17 NOTE — Telephone Encounter (Signed)
Spoke to Keith Hall. Advised her I faxed the rx to [facility]

## 2015-10-17 NOTE — Telephone Encounter (Signed)
Mrs Keith Hall request refill [facility]. Last printed # 90 on 07/11/15; pt has 5 pills left. Last seen 07/04/15. Mrs Keith Hall request cb when refilled.

## 2015-11-03 ENCOUNTER — Other Ambulatory Visit: Payer: Self-pay | Admitting: Internal Medicine

## 2015-11-03 NOTE — Telephone Encounter (Signed)
Mrs Heppe wanted to confirm atorvastatin had been refilled [facility] to [facility]; advised already done. Mrs Waychoff voiced understanding.

## 2015-11-21 DIAGNOSIS — E782 Mixed hyperlipidemia: Secondary | ICD-10-CM | POA: Diagnosis not present

## 2015-11-21 DIAGNOSIS — Z9889 Other specified postprocedural states: Secondary | ICD-10-CM | POA: Diagnosis not present

## 2015-11-21 DIAGNOSIS — I469 Cardiac arrest, cause unspecified: Secondary | ICD-10-CM | POA: Diagnosis not present

## 2015-11-21 DIAGNOSIS — R079 Chest pain, unspecified: Secondary | ICD-10-CM | POA: Diagnosis not present

## 2015-12-25 ENCOUNTER — Other Ambulatory Visit: Payer: Self-pay

## 2015-12-25 MED ORDER — CLONAZEPAM 0.5 MG PO TABS: ORAL_TABLET | ORAL | Status: DC

## 2015-12-25 NOTE — Telephone Encounter (Signed)
Rx printed so it can be signed and faxed to [facility].

## 2015-12-25 NOTE — Telephone Encounter (Signed)
Approved: #90 x 0 This is early but they may be trying to get ahead. I won't refill this again for a full 3 months

## 2015-12-25 NOTE — Telephone Encounter (Signed)
rx faxed to [facility]

## 2015-12-25 NOTE — Telephone Encounter (Signed)
June (DPR signed) left v/m requesting [facility]ill [facility]. Last refilled # 90 on 10/17/15. Pt last seen 07/04/15.

## 2016-01-03 ENCOUNTER — Encounter: Payer: Self-pay | Admitting: Internal Medicine

## 2016-01-03 ENCOUNTER — Ambulatory Visit (INDEPENDENT_AMBULATORY_CARE_PROVIDER_SITE_OTHER): Payer: PPO | Admitting: Internal Medicine

## 2016-01-03 VITALS — BP 110/80 | HR 64 | Temp 97.9°F | Wt 200.0 lb

## 2016-01-03 DIAGNOSIS — I69359 Hemiplegia and hemiparesis following cerebral infarction affecting unspecified side: Secondary | ICD-10-CM | POA: Diagnosis not present

## 2016-01-03 DIAGNOSIS — I639 Cerebral infarction, unspecified: Secondary | ICD-10-CM | POA: Diagnosis not present

## 2016-01-03 DIAGNOSIS — IMO0002 Reserved for concepts with insufficient information to code with codable children: Secondary | ICD-10-CM

## 2016-01-03 DIAGNOSIS — F0631 Mood disorder due to known physiological condition with depressive features: Secondary | ICD-10-CM

## 2016-01-03 DIAGNOSIS — F331 Major depressive disorder, recurrent, moderate: Secondary | ICD-10-CM

## 2016-01-03 MED ORDER — BUPROPION HCL ER (XL) 150 MG PO TB24: 150.0000 mg | ORAL_TABLET | Freq: Every day | ORAL | Status: DC

## 2016-01-03 NOTE — Assessment & Plan Note (Signed)
Mild disability but has caused great emotional distress

## 2016-01-03 NOTE — Progress Notes (Signed)
Pre visit review using our clinic review tool, if applicable. No additional management support is needed unless otherwise documented below in the visit note. 

## 2016-01-03 NOTE — Progress Notes (Signed)
Subjective:    Patient ID: Keith Hall, male    DOB: 10-15-1945, 70 y.o.   MRN: [id number]  HPI Here for follow up of stroke and depression By himself  Drives and independent with all ADLs Still trouble with weakness and use of left hand Sensory changes in hand only This still bothers him a great deal Also with balance problems--- cane helps.  Has had several falls ---- nasal fracture some months ago  Still feels depressed Continues on the full tab of citalopram Worse in the past 2 months Feels "bored out of my mind" [facility]--hasn't gotten back there "I just wish it was over". No suicidal ideation though  Current Outpatient Prescriptions on File Prior to Visit  Medication Sig Dispense Refill  . acetaminophen (TYLENOL) 500 MG tablet Take 500 mg by mouth 3 (three) times daily as needed.     Marland Kitchen aspirin 325 MG EC tablet Take 1 tablet (325 mg total) by mouth daily. 90 tablet 1  . atorvastatin (LIPITOR) 40 MG tablet Take 1 tablet by mouth every day 90 tablet 2  . citalopram (CELEXA) 20 MG tablet Take 1 tablet (20 mg total) by mouth daily. 90 tablet 3  . clonazePAM (KLONOPIN) 0.5 MG tablet TAKE 1/2 - 1 TABLET EVERY DAY AT BEDTIME AS NEEDED FOR ANXIETY 90 tablet 0  . gabapentin (NEURONTIN) 300 MG capsule TAKE 1 CAPSULE BY MOUTH AT BEDTIME 90 capsule 3  . Glucosamine-Chondroitin (MOVE FREE PO) Take 2 tablets by mouth daily.     No current facility-administered medications on file prior to visit.    Allergies  Allergen Reactions  . Penicillins Swelling    Past Medical History  Diagnosis Date  . Osteoarthritis of both knees   . Depression 1980's2004/04/03    MDD---even needed hospitalization  . Kidney stone 1980 only  . Sudden cardiac death [facility]) 09/18/1999    no clear etiology of this  . BPH (benign prostatic hypertrophy)   . 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 Cardiovascular stress test  07/09/11    Negative--Dr Paraschos  . Cardiac catheterization  last in 2007-09-18    insignificant coronary disease  . Hernia repair  2010/09/18    left inguinal--Dr Byrnett  . Rotator cuff repair  09-18-2007    both done by Dr Margaretmary Eddy  . 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 Total knee arthroplasty Left 1/15    Dr [facility]    Family History  Problem Relation Age of Onset  . Stroke Mother   . COPD Sister   . Heart disease Neg Hx   . Hypertension Neg Hx   . Diabetes Son     Type 1    Social History   Social History  . Marital Status: Married    Spouse Name: N/A  . Number of Children: 1  . Years of Education: N/A   Occupational History  . Outside sales of furniture     Retired at [place]  . Smoking status: Never Smoker   . Smokeless tobacco: Never Used  . Alcohol Use: No  . Drug Use: No  . Sexual Activity: Not on file   Other Topics Concern  . Not on file   Social History Narrative   1 son   Has  living will   Wife, then son, have health care POA   Would accept resuscitation attempts but no prolonged artificial ventilation   Would not want tube feeds if cognitively unaware   Review of Systems Sleeps fairly well. Some trouble getting back to sleep after getting up to void Appetite is okay Weight down slightly--been on a "diet"    Objective:   Physical Exam  Constitutional: He appears well-nourished. No distress.  Neck: Normal range of motion. Neck supple. No thyromegaly present.  Cardiovascular: Normal rate and regular rhythm.  Exam reveals no gallop.   Gr 3/6 systolic murmur--towards base  Pulmonary/Chest: Effort normal and breath sounds normal. No respiratory distress. He has no wheezes. He has no rales.  Musculoskeletal: He exhibits no edema.  Lymphadenopathy:    He has no cervical adenopathy.  Neurological:  Left hand still mildly weak and apraxic Mild balance issues and trouble getting up  on table  Psychiatric:  Depressed Psychomotor retardation No active suicidal ideation          Assessment & Plan:

## 2016-01-03 NOTE — Assessment & Plan Note (Signed)
This has exacerbated long standing MDD

## 2016-01-03 NOTE — Patient Instructions (Signed)
Please start the bupropion once a day (morning) for your depression. Let me know if you have any problems with this. Continue the citalopram as well.

## 2016-01-03 NOTE — Assessment & Plan Note (Signed)
Exacerbated again Not suicidal Doesn't want counseling Will add bupropion Consider ritalin or antipsychotic also

## 2016-01-09 ENCOUNTER — Telehealth: Payer: Self-pay

## 2016-01-09 NOTE — Telephone Encounter (Signed)
Mrs Schiess (DPR signed) said pt was seen 01/03/16 and mail order has not received the new depression med. Advised Mrs Seymour the med was sent to [facility] and pt has f/u appt on 02/07/16. Mrs Mangels said pt will pick up today and start med in the morning. Nothing further needed. Will cb if any questions or problems.

## 2016-02-07 ENCOUNTER — Ambulatory Visit (INDEPENDENT_AMBULATORY_CARE_PROVIDER_SITE_OTHER): Payer: PPO | Admitting: Internal Medicine

## 2016-02-07 ENCOUNTER — Encounter: Payer: Self-pay | Admitting: Internal Medicine

## 2016-02-07 DIAGNOSIS — IMO0002 Reserved for concepts with insufficient information to code with codable children: Secondary | ICD-10-CM

## 2016-02-07 DIAGNOSIS — I639 Cerebral infarction, unspecified: Secondary | ICD-10-CM | POA: Diagnosis not present

## 2016-02-07 DIAGNOSIS — F0631 Mood disorder due to known physiological condition with depressive features: Secondary | ICD-10-CM | POA: Diagnosis not present

## 2016-02-07 NOTE — Assessment & Plan Note (Signed)
Ongoing symptoms Concerned now by wife's medical problems also Discussed potential risks of bupropion---he will try Urged him to get out---like daily [facility]  He does go to church usually

## 2016-02-07 NOTE — Patient Instructions (Signed)
Please start the bupropion. Don't drive for the first few hours after taking--just to make sure you don't have any significant dizziness. Call me if you have any problems tolerating this. [facility] back [facility] to the [facility] regularly!!!

## 2016-02-07 NOTE — Progress Notes (Signed)
Subjective:    Patient ID: Keith Hall, male    DOB: [birth year], 70 y.o.   MRN: [id number]  HPI Here for follow up of depression  He never started the bupropion Read the precautions and thought he wasn't allowed to drive Still feels about the same Still not actively suicidal but wishes he was dead Anhedonic  Current Outpatient Prescriptions on File Prior to Visit  Medication Sig Dispense Refill  . acetaminophen (TYLENOL) 500 MG tablet Take 500 mg by mouth 3 (three) times daily as needed.     Marland Kitchen aspirin 325 MG EC tablet Take 1 tablet (325 mg total) by mouth daily. 90 tablet 1  . atorvastatin (LIPITOR) 40 MG tablet Take 1 tablet by mouth every day 90 tablet 2  . citalopram (CELEXA) 20 MG tablet Take 1 tablet (20 mg total) by mouth daily. 90 tablet 3  . clonazePAM (KLONOPIN) 0.5 MG tablet TAKE 1/2 - 1 TABLET EVERY DAY AT BEDTIME AS NEEDED FOR ANXIETY 90 tablet 0  . gabapentin (NEURONTIN) 300 MG capsule TAKE 1 CAPSULE BY MOUTH AT BEDTIME 90 capsule 3  . Glucosamine-Chondroitin (MOVE FREE PO) Take 2 tablets by mouth daily.    Marland Kitchen buPROPion (WELLBUTRIN XL) 150 MG 24 hr tablet Take 1 tablet (150 mg total) by mouth daily. (Patient not taking: Reported on 02/07/2016) 30 tablet 3   No current facility-administered medications on file prior to visit.     Allergies  Allergen Reactions  . Penicillins Swelling    Past Medical History:  Diagnosis Date  . BPH (benign prostatic hypertrophy)   . Depression 1980'sApril 06, 2004   MDD---even needed hospitalization  . 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 Kidney stone 1980 only  . Osteoarthritis of both knees   . Sudden cardiac death [facility]) September 21, 1999   no clear etiology of this    Past Surgical History:  Procedure Laterality Date  . CARDIAC CATHETERIZATION  last in 21-Sep-2007   insignificant coronary disease  . CARDIOVASCULAR STRESS TEST  07/09/11   Negative--Dr Paraschos  . HERNIA REPAIR  Sep 21, 2010   left inguinal--Dr Byrnett  . ROTATOR CUFF REPAIR  09/21/2007   both done by Dr Margaretmary Eddy  . TOTAL KNEE ARTHROPLASTY Right 2/14   Dr Marry Guan  . TOTAL KNEE ARTHROPLASTY Left 1/15   Dr [facility]    Family History  Problem Relation Age of Onset  . Stroke Mother   . COPD Sister   . Heart disease Neg Hx   . Hypertension Neg Hx   . Diabetes Son     Type 1    Social History   Social History  . Marital status: Married    Spouse name: N/A  . Number of children: 1  . Years of education: N/A   Occupational History  . Outside sales of furniture     Retired at [employer]  . Smoking status: Never Smoker  . Smokeless tobacco: Never Used  . Alcohol use No  . Drug use: No  . Sexual activity: Not on file   Other Topics Concern  . Not on file   Social History Narrative   1 son   Has living will   Wife, then son, have health care POA   Would accept resuscitation attempts but no prolonged artificial ventilation   Would not want tube feeds if cognitively unaware   Review of Systems  Sleeps is "so-so" Appetite  is okay---weight up a couple of pounds [facility]     Objective:   Physical Exam  Constitutional: He appears well-developed. No distress.  Psychiatric:  No psychomotor retardation Normal speech and appearance          Assessment & Plan:

## 2016-02-07 NOTE — Progress Notes (Signed)
Pre visit review using our clinic review tool, if applicable. No additional management support is needed unless otherwise documented below in the visit note. 

## 2016-03-13 ENCOUNTER — Encounter: Payer: Self-pay | Admitting: Internal Medicine

## 2016-03-13 ENCOUNTER — Ambulatory Visit (INDEPENDENT_AMBULATORY_CARE_PROVIDER_SITE_OTHER): Payer: PPO | Admitting: Internal Medicine

## 2016-03-13 DIAGNOSIS — M545 Low back pain, unspecified: Secondary | ICD-10-CM

## 2016-03-13 DIAGNOSIS — F331 Major depressive disorder, recurrent, moderate: Secondary | ICD-10-CM

## 2016-03-13 MED ORDER — BUPROPION HCL ER (XL) 300 MG PO TB24
300.0000 mg | ORAL_TABLET | Freq: Every day | ORAL | 5 refills | Status: DC
Start: 2016-03-13 — End: 2016-09-06

## 2016-03-13 MED ORDER — PREDNISONE 20 MG PO TABS: 40.0000 mg | ORAL_TABLET | Freq: Every day | ORAL | 0 refills | Status: DC

## 2016-03-13 NOTE — Progress Notes (Signed)
Subjective:    Patient ID: Keith Hall, male    DOB: [birth year], 70 y.o.   MRN: [id number]  HPI Here for follow up of depression Wife is with him  Did start the medication He feels better-- but he talks it up more than his wife thinks he is doing Has chronic depression which is worse with the stroke  [facility] Pulled muscles in left shoulder and left hip is hurting Had to stop going to the gym Dragging the left leg some Wonders about trying prednisone briefly Back is also bothering him--prednisone really helped in the past  Still stays in the house mostly Rarely goes to [place] anymore Wife makes him go on errands--- but stays in the car since back/hip pain Won't call his friends-- they stopped calling since he said no all the time  Current Outpatient Prescriptions on File Prior to Visit  Medication Sig Dispense Refill  . acetaminophen (TYLENOL) 500 MG tablet Take 500 mg by mouth 3 (three) times daily as needed.     Marland Kitchen aspirin 325 MG EC tablet Take 1 tablet (325 mg total) by mouth daily. 90 tablet 1  . atorvastatin (LIPITOR) 40 MG tablet Take 1 tablet by mouth every day 90 tablet 2  . buPROPion (WELLBUTRIN XL) 150 MG 24 hr tablet Take 1 tablet (150 mg total) by mouth daily. 30 tablet 3  . citalopram (CELEXA) 20 MG tablet Take 1 tablet (20 mg total) by mouth daily. 90 tablet 3  . clonazePAM (KLONOPIN) 0.5 MG tablet TAKE 1/2 - 1 TABLET EVERY DAY AT BEDTIME AS NEEDED FOR ANXIETY 90 tablet 0  . gabapentin (NEURONTIN) 300 MG capsule TAKE 1 CAPSULE BY MOUTH AT BEDTIME 90 capsule 3  . Glucosamine-Chondroitin (MOVE FREE PO) Take 2 tablets by mouth daily.     No current facility-administered medications on file prior to visit.     Allergies  Allergen Reactions  . Penicillins Swelling    Past Medical History:  Diagnosis Date  . BPH (benign prostatic hypertrophy)   . Depression 1980's04/03/2003   MDD---even needed hospitalization  . Intraparenchymal hemorrhage  of brain ([place]) 12/15   Right basal ganglia--Rx at [facility] and rehab  . Kidney stone 1980 only  . Osteoarthritis of both knees   . Sudden cardiac death [facility]) 1999/09/25   no clear etiology of this    Past Surgical History:  Procedure Laterality Date  . CARDIAC CATHETERIZATION  last in 09/25/07   insignificant coronary disease  . CARDIOVASCULAR STRESS TEST  07/09/11   Negative--Dr Paraschos  . HERNIA REPAIR  09-25-10   left inguinal--Dr Byrnett  . ROTATOR CUFF REPAIR  Sep 25, 2007   both done by Dr Margaretmary Eddy  . TOTAL KNEE ARTHROPLASTY Right 2/14   Dr Marry Guan  . TOTAL KNEE ARTHROPLASTY Left 1/15   Dr [facility]    Family History  Problem Relation Age of Onset  . Stroke Mother   . COPD Sister   . Heart disease Neg Hx   . Hypertension Neg Hx   . Diabetes Son     Type 1    Social History   Social History  . Marital status: Married    Spouse name: N/A  . Number of children: 1  . Years of education: N/A   Occupational History  . Outside sales of furniture     Retired at [employer]  . Smoking status: Never Smoker  . Smokeless tobacco:  Never Used  . Alcohol use No  . Drug use: No  . Sexual activity: Not on file   Other Topics Concern  . 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 No distress.  Musculoskeletal:  No spine tenderness Mild tenderness along left traps, left low back Fairly normal ROM in hips SLR negative  Psychiatric:  Smiling--but still some depressed mood Appropriate affect          Assessment &  Plan:

## 2016-03-13 NOTE — Patient Instructions (Signed)
Please increase the bupropion to 300 mg a day---take 2 of the 150mg  pills till you run out--- and then fill the new prescription.

## 2016-03-13 NOTE — Progress Notes (Signed)
Pre visit review using our clinic review tool, if applicable. No additional management support is needed unless otherwise documented below in the visit note. 

## 2016-03-13 NOTE — Assessment & Plan Note (Signed)
Doesn't seem to be disc related Not clearly hip either Will go ahead and try the prednisone

## 2016-03-13 NOTE — Assessment & Plan Note (Signed)
Some response to bupropion--will increase to 300mg  daily

## 2016-04-29 ENCOUNTER — Other Ambulatory Visit: Payer: Self-pay | Admitting: *Deleted

## 2016-04-29 MED ORDER — ATORVASTATIN CALCIUM 40 MG PO TABS: 40.0000 mg | ORAL_TABLET | Freq: Every day | ORAL | 0 refills | Status: DC

## 2016-04-29 NOTE — Telephone Encounter (Signed)
Patient's wife left a voicemail requesting refill on Atorvastatin and Citalopram. Refill sent for Atorvastatin.   See drug warning with Bupropion. Okay to refill?

## 2016-04-30 MED ORDER — CITALOPRAM HYDROBROMIDE 20 MG PO TABS: 20.0000 mg | ORAL_TABLET | Freq: Every day | ORAL | 0 refills | Status: DC

## 2016-04-30 MED ORDER — ATORVASTATIN CALCIUM 40 MG PO TABS: 40.0000 mg | ORAL_TABLET | Freq: Every day | ORAL | 3 refills | Status: DC

## 2016-04-30 MED ORDER — CITALOPRAM HYDROBROMIDE 20 MG PO TABS: 20.0000 mg | ORAL_TABLET | Freq: Every day | ORAL | 3 refills | Status: DC

## 2016-04-30 NOTE — Telephone Encounter (Addendum)
Keith Hall left v/m requesting cb about refills for atorvastatin and citalopram. I spoke with pt and advised already sent by Dr Everardo Beals CMA [facility]. Pt voiced understanding.

## 2016-04-30 NOTE — Telephone Encounter (Signed)
These could be done for a year--please change

## 2016-04-30 NOTE — Telephone Encounter (Signed)
Okay to continue.  Sent.  Thanks. 

## 2016-04-30 NOTE — Telephone Encounter (Signed)
Rxs sent electronically.  

## 2016-04-30 NOTE — Addendum Note (Signed)
Addended by: Pilar Grammes on: 04/30/2016 09:30 AM   Modules accepted: Orders

## 2016-05-13 ENCOUNTER — Telehealth: Payer: Self-pay | Admitting: Internal Medicine

## 2016-05-13 NOTE — Telephone Encounter (Signed)
Form signed No charge Approved his participation in the free program

## 2016-05-13 NOTE — Telephone Encounter (Signed)
Pt called and would like a note faxed to [phone] the [facility] physical therapy program at [facility].  They mailed the form and letter to [place] and we just received the note today at 1:00 pm, placed form in Dr. Alla German tray for completion.

## 2016-05-31 ENCOUNTER — Emergency Department
Admission: EM | Admit: 2016-05-31 | Discharge: 2016-05-31 | Disposition: A | Discharge status: Home / Self Care | Payer: PPO | Attending: Emergency Medicine | Admitting: Emergency Medicine

## 2016-05-31 ENCOUNTER — Encounter: Payer: Self-pay | Admitting: Emergency Medicine

## 2016-05-31 ENCOUNTER — Emergency Department: Payer: PPO

## 2016-05-31 DIAGNOSIS — R51 Headache: Secondary | ICD-10-CM | POA: Diagnosis not present

## 2016-05-31 DIAGNOSIS — Z9889 Other specified postprocedural states: Secondary | ICD-10-CM | POA: Diagnosis not present

## 2016-05-31 DIAGNOSIS — E782 Mixed hyperlipidemia: Secondary | ICD-10-CM | POA: Diagnosis not present

## 2016-05-31 DIAGNOSIS — R519 Headache, unspecified: Secondary | ICD-10-CM

## 2016-05-31 DIAGNOSIS — Z7982 Long term (current) use of aspirin: Secondary | ICD-10-CM | POA: Insufficient documentation

## 2016-05-31 DIAGNOSIS — R079 Chest pain, unspecified: Secondary | ICD-10-CM | POA: Diagnosis not present

## 2016-05-31 HISTORY — DX: Cerebral infarction, unspecified: I63.9

## 2016-05-31 NOTE — ED Provider Notes (Signed)
[facility] Emergency Department Provider Note   ____________________________________________   First MD Initiated Contact with Patient 05/31/16 1615     (approximate)  I have reviewed the triage vital signs and the nursing notes.   HISTORY  Chief Complaint Headache    HPI Keith Hall is a 70 y.o. male who was sent over from Dr. Tomasita Crumble as office. Patient reports for the last 2 nights he's had a very localized moderate in intensity headache in the right temple. It happens when he lays down on the couch. He does not have it otherwise. He does not have a headache now.  Patient reports he had an intracranial hemorrhage in the past. He is having no new focal neurological deficits. He has no jaw claudication no eye problems and no tenderness on palpation of the temples. And again he has no headache at present. The headache, when it is present, is just a pain it is not throbbing or sharp or anything like that.   Past Medical History:  Diagnosis Date  . BPH (benign prostatic hypertrophy)   . Depression 1980's2004/04/21   MDD---even needed hospitalization  . 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 Kidney stone 1980 only  . Osteoarthritis of both knees   . Stroke ([place])   . Sudden cardiac death [facility]) 10/06/1999   no clear etiology of this    Patient Active Problem List   Diagnosis Date Noted  . Low back pain 03/13/2016  . Elevated liver function tests 07/04/2015  . Thrombocytopenia ([place]) 07/04/2015  . 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 Fatigue 08/23/2014  . Frequent urination 08/23/2014  . Depression due to stroke ([place]) 07/27/2014  . Intraparenchymal hemorrhage of brain ([place])   . Advanced directives, counseling/discussion 02/07/2014  . Routine general medical examination at a health care facility 02/08/2013  . Aortic sclerosis ([place])  01/23/2013  . Osteoarthritis of both knees   . MDD (major depressive disorder), recurrent episode, moderate ([place])   . Sudden cardiac death ([place])   . BPH (benign prostatic hypertrophy)     Past Surgical History:  Procedure Laterality Date  . CARDIAC CATHETERIZATION  last in 10-06-07   insignificant coronary disease  . CARDIOVASCULAR STRESS TEST  07/09/11   Negative--Dr Paraschos  . HERNIA REPAIR  10/06/10   left inguinal--Dr Byrnett  . ROTATOR CUFF REPAIR  10-06-2007   both done by Dr Margaretmary Eddy  . TOTAL KNEE ARTHROPLASTY Right 2/14   Dr Marry Guan  . TOTAL KNEE ARTHROPLASTY Left 1/15   Dr [facility]    Prior to Admission medications   Medication Sig Start Date End Date Taking? Authorizing Provider  acetaminophen (TYLENOL) 500 MG tablet Take 500 mg by mouth 3 (three) times daily as needed.  06/21/14   Historical Provider, MD  aspirin 325 MG EC tablet Take 1 tablet (325 mg total) by mouth daily. 07/29/14   Venia Carbon, MD  atorvastatin (LIPITOR) 40 MG tablet Take 1 tablet (40 mg total) by mouth daily. 04/30/16   Venia Carbon, MD  buPROPion (WELLBUTRIN XL) 300 MG 24 hr tablet Take 1 tablet (300 mg total) by mouth daily. 03/13/16   Venia Carbon, MD  citalopram (CELEXA) 20 MG tablet Take 1 tablet (20 mg total) by mouth daily. 04/30/16   Venia Carbon, MD  clonazePAM (KLONOPIN) 0.5 MG tablet TAKE 1/2 - 1 TABLET EVERY DAY  AT BEDTIME AS NEEDED FOR ANXIETY 12/25/15   Venia Carbon, MD  gabapentin (NEURONTIN) 300 MG capsule TAKE 1 CAPSULE BY MOUTH AT BEDTIME 01/20/15   Venia Carbon, MD  Glucosamine-Chondroitin (MOVE FREE PO) Take 2 tablets by mouth daily.    Historical Provider, MD  predniSONE (DELTASONE) 20 MG tablet Take 2 tablets (40 mg total) by mouth daily. 03/13/16   Venia Carbon, MD    Allergies Penicillins  Family History  Problem Relation Age of Onset  . Stroke Mother   . COPD Sister   . Diabetes Son     Type 1  . Heart disease Neg Hx   . Hypertension Neg Hx      Social History Social History  Substance Use Topics  . Smoking status: Never Smoker  . Smokeless tobacco: Never Used  . Alcohol use No    Review of Systems Constitutional: No fever/chills Eyes: No visual changes. ENT: No sore throat. Cardiovascular: Denies chest pain. Respiratory: Denies shortness of breath. Gastrointestinal: No abdominal pain.  No nausea, no vomiting.  No diarrhea.  No constipation. Genitourinary: Negative for dysuria. Musculoskeletal: Negative for back pain. Skin: Negative for rash. Neurological: Negative for new focal weakness or numbness.  10-point ROS otherwise negative.  ____________________________________________   PHYSICAL EXAM:  VITAL SIGNS: ED Triage Vitals  Enc Vitals Group     BP 05/31/16 1426 (!) 135/93     Pulse Rate 05/31/16 1426 82     Resp 05/31/16 1426 20     Temp 05/31/16 1426 98.3 F (36.8 C)     Temp Source 05/31/16 1426 Oral     SpO2 05/31/16 1426 96 %     Weight 05/31/16 1426 201 lb (91.2 kg)     Height 05/31/16 1426 5\' 9"  (1.753 m)     Head Circumference --      Peak Flow --      Pain Score 05/31/16 1427 0     Pain Loc --      Pain Edu? --      Excl. in [place]? --     Constitutional: Alert and oriented. Well appearing and in no acute distress. Eyes: Conjunctivae are normal. PERRL. EOMI.Funduscopic exam difficult to see but appears normal Head: Atraumatic. No tenderness over the temples whatsoever Nose: No congestion/rhinnorhea. Mouth/Throat: Mucous membranes are moist.  Oropharynx non-erythematous. Neck: No stridor.  Cardiovascular: Normal rate, regular rhythm. Grossly normal heart sounds.  Good peripheral circulation. Respiratory: Normal respiratory effort.  No retractions. Lungs CTAB. Gastrointestinal: Soft and nontender. No distention. No abdominal bruits. No CVA tenderness. Musculoskeletal: No lower extremity tenderness nor edema.  No joint effusions. Neurologic:  Normal speech and language. No gross focal  neurologic deficits are appreciated. Cranial nerves II through XII are intact cerebellar shows some ataxia on the left side but this is old motor strength is 5 over 5 throughout. No gait instability. Skin:  Skin is warm, dry and intact. No rash noted. Psychiatric: Mood and affect are normal. Speech and behavior are normal.  ____________________________________________   LABS (all labs ordered are listed, but only abnormal results are displayed)  Labs Reviewed - No data to display ____________________________________________  EKG   ____________________________________________  RADIOLOGY  Study Result   CLINICAL DATA:  Headache for 2 days.  EXAM: CT HEAD WITHOUT CONTRAST  TECHNIQUE: Contiguous axial images were obtained from the base of the skull through the vertex without intravenous contrast.  COMPARISON:  07/04/2014  FINDINGS: Brain: Chronic microvascular changes throughout the deep  white matter. No acute intracranial abnormality. Specifically, no hemorrhage, hydrocephalus, mass lesion, acute infarction, or significant intracranial injury.  Vascular: No hyperdense vessel or unexpected calcification.  Skull: No acute calvarial abnormality.  Sinuses/Orbits: Visualized paranasal sinuses and mastoids clear. Orbital soft tissues unremarkable.  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 No acute intracranial abnormality.   Electronically Signed   By: Rolm Baptise M.D.   On: 05/31/2016 14:53    ____________________________________________   PROCEDURES  Procedure(s) performed:  Procedures  Critical Care performed:  ____________________________________________   INITIAL IMPRESSION / ASSESSMENT AND PLAN / ED COURSE  Pertinent labs & imaging results that were available during my care of the patient were reviewed by me and considered in my medical decision making (see chart for  details).    Clinical Course      ____________________________________________   FINAL CLINICAL IMPRESSION(S) / ED DIAGNOSES  Final diagnoses:  Nonintractable episodic headache, unspecified headache type      NEW MEDICATIONS STARTED DURING THIS VISIT:  New Prescriptions   No medications on file     Note:  This document was prepared using Dragon voice recognition software and may include unintentional dictation errors.    Nena Polio, MD 05/31/16 [phone]

## 2016-05-31 NOTE — Discharge Instructions (Signed)
Please return if the headache worsens or changes. Also please return if you temple becomes tender to touch, if you have any visual problems or pain in jaw after chewing

## 2016-05-31 NOTE — ED Triage Notes (Signed)
Cardiologist sent pt over for head ct for headache for two days. Pt with hx of stroke two yrs ago.

## 2016-06-03 ENCOUNTER — Telehealth: Payer: Self-pay

## 2016-06-03 NOTE — Telephone Encounter (Signed)
Spoke to wife, June. She said he seems to be okay. He had a CT Scan that was normal.

## 2016-06-20 ENCOUNTER — Ambulatory Visit: Payer: PPO | Admitting: Internal Medicine

## 2016-07-02 ENCOUNTER — Ambulatory Visit (INDEPENDENT_AMBULATORY_CARE_PROVIDER_SITE_OTHER): Payer: PPO | Admitting: Internal Medicine

## 2016-07-02 ENCOUNTER — Encounter: Payer: Self-pay | Admitting: Internal Medicine

## 2016-07-02 VITALS — BP 122/74 | HR 75 | Temp 97.9°F | Wt 203.0 lb

## 2016-07-02 DIAGNOSIS — R519 Headache, unspecified: Secondary | ICD-10-CM | POA: Insufficient documentation

## 2016-07-02 DIAGNOSIS — F331 Major depressive disorder, recurrent, moderate: Secondary | ICD-10-CM | POA: Diagnosis not present

## 2016-07-02 DIAGNOSIS — N400 Enlarged prostate without lower urinary tract symptoms: Secondary | ICD-10-CM | POA: Diagnosis not present

## 2016-07-02 DIAGNOSIS — R51 Headache: Secondary | ICD-10-CM | POA: Diagnosis not present

## 2016-07-02 LAB — PSA: PSA: 0.69 ng/mL (ref 0.10–4.00)

## 2016-07-02 LAB — SEDIMENTATION RATE: SED RATE: 2 mm/h (ref 0–20)

## 2016-07-02 NOTE — Assessment & Plan Note (Signed)
Not bad enough for meds He is very worried about cancer--will check PSA again

## 2016-07-02 NOTE — Assessment & Plan Note (Signed)
Better with the increased bupropion Will continue both meds

## 2016-07-02 NOTE — Progress Notes (Signed)
Pre visit review using our clinic review tool, if applicable. No additional management support is needed unless otherwise documented below in the visit note. 

## 2016-07-02 NOTE — Assessment & Plan Note (Signed)
On right Seems benign but will just check ESR 

## 2016-07-02 NOTE — Progress Notes (Signed)
Subjective:    Patient ID: Keith Hall, male    DOB: 30-Aug-1945, 71 y.o.   MRN: [id number]  HPI Here for follow up of depression  He thinks the bupropion is helping Confused about his meds though Did cut down on gabapentin--300mg  was too much---he felt like he was "drugged" Mood is better  Taking 200mg  of the gabapentin He does notice some trouble with balance--since stroke, but feels more prominent lately Uses this for back pain  Current Outpatient Prescriptions on File Prior to Visit  Medication Sig Dispense Refill  . acetaminophen (TYLENOL) 500 MG tablet Take 500 mg by mouth 3 (three) times daily as needed.     Marland Kitchen aspirin 325 MG EC tablet Take 1 tablet (325 mg total) by mouth daily. 90 tablet 1  . atorvastatin (LIPITOR) 40 MG tablet Take 1 tablet (40 mg total) by mouth daily. 90 tablet 3  . buPROPion (WELLBUTRIN XL) 300 MG 24 hr tablet Take 1 tablet (300 mg total) by mouth daily. 30 tablet 5  . citalopram (CELEXA) 20 MG tablet Take 1 tablet (20 mg total) by mouth daily. 90 tablet 3  . clonazePAM (KLONOPIN) 0.5 MG tablet TAKE 1/2 - 1 TABLET EVERY DAY AT BEDTIME AS NEEDED FOR ANXIETY 90 tablet 0  . Glucosamine-Chondroitin (MOVE FREE PO) Take 2 tablets by mouth daily.     No current facility-administered medications on file prior to visit.     Allergies  Allergen Reactions  . Penicillins Swelling    Past Medical History:  Diagnosis Date  . BPH (benign prostatic hypertrophy)   . Depression 1980's04/16/04   MDD---even needed hospitalization  . 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 Kidney stone 1980 only  . Osteoarthritis of both knees   . Stroke ([place])   . Sudden cardiac death [facility]) 10-01-99   no clear etiology of this    Past Surgical History:  Procedure Laterality Date  . CARDIAC CATHETERIZATION  last in 10/01/07   insignificant coronary disease  . CARDIOVASCULAR STRESS TEST  07/09/11   Negative--Dr Paraschos  .  HERNIA REPAIR  2010-10-01   left inguinal--Dr Byrnett  . ROTATOR CUFF REPAIR  October 01, 2007   both done by Dr Margaretmary Eddy  . TOTAL KNEE ARTHROPLASTY Right 2/14   Dr Marry Guan  . TOTAL KNEE ARTHROPLASTY Left 1/15   Dr [facility]    Family History  Problem Relation Age of Onset  . Stroke Mother   . COPD Sister   . Diabetes Son     Type 1  . Heart disease Neg Hx   . Hypertension Neg Hx     Social History   Social History  . Marital status: Married    Spouse name: N/A  . Number of children: 1  . Years of education: N/A   Occupational History  . Outside sales of furniture     Retired at [employer]  . Smoking status: Never Smoker  . Smokeless tobacco: Never Used  . Alcohol use No  . Drug use: No  . Sexual activity: Not on file   Other Topics Concern  . Not on file   Social History Narrative   1 son   Has living will   Wife, then son, have health care POA   Would accept resuscitation attempts but no prolonged artificial ventilation   Would not want tube feeds if cognitively unaware   Review of  Systems Has had some sporadic right temple pain-- Dr Saralyn Pilar ordered head CT which was fine He is worried about prostate--friends have died recently Does have some increased frequency No headache No jaw claudication    Objective:   Physical Exam  Constitutional: He appears well-nourished. No distress.  HENT:  No temporal bruits  Psychiatric: He has a normal mood and affect. His behavior is normal.  Smiling Normal interaction           Assessment & Plan:

## 2016-07-08 ENCOUNTER — Telehealth: Payer: Self-pay

## 2016-07-08 NOTE — Telephone Encounter (Signed)
Keith Hall request refill citalopram to [facility]. Keith Hall will contact [name] about refills being available as of 04/30/16.

## 2016-08-15 ENCOUNTER — Encounter: Payer: Self-pay | Admitting: Internal Medicine

## 2016-08-15 NOTE — Telephone Encounter (Signed)
error 

## 2016-08-27 ENCOUNTER — Telehealth: Payer: Self-pay

## 2016-08-27 NOTE — Telephone Encounter (Signed)
Mrs Drotar left v/m; pt had a stroke on lt side 2 yrs ago. Pt uses the rt arm to pull himself up into a seated position;the muscles in rt arm have contracted and causing pt a lot of pain; this has happened in the past and prednisone is the only thing that really helps. Pt last seen 07/02/16. Mrs Albrecht request cb. [facility].

## 2016-08-28 MED ORDER — PREDNISONE 20 MG PO TABS: 40.0000 mg | ORAL_TABLET | Freq: Every day | ORAL | 0 refills | Status: DC

## 2016-08-28 NOTE — Telephone Encounter (Signed)
Okay to send Rx for prednisone 20mg  #6 x 0 2 tabs daily for 3 days If ongoing problems, should be seen next week

## 2016-08-28 NOTE — Telephone Encounter (Signed)
Spoke to pt and advised per Dr Silvio Pate. Rx sent to requested pharmacy

## 2016-09-06 ENCOUNTER — Other Ambulatory Visit: Payer: Self-pay | Admitting: Internal Medicine

## 2016-09-26 ENCOUNTER — Ambulatory Visit (INDEPENDENT_AMBULATORY_CARE_PROVIDER_SITE_OTHER): Payer: PPO | Admitting: Internal Medicine

## 2016-09-26 ENCOUNTER — Encounter: Payer: Self-pay | Admitting: Internal Medicine

## 2016-09-26 ENCOUNTER — Encounter: Payer: PPO | Admitting: Internal Medicine

## 2016-09-26 VITALS — BP 118/80 | HR 81 | Temp 97.9°F | Ht 68.75 in | Wt 205.0 lb

## 2016-09-26 DIAGNOSIS — N400 Enlarged prostate without lower urinary tract symptoms: Secondary | ICD-10-CM

## 2016-09-26 DIAGNOSIS — Z7189 Other specified counseling: Secondary | ICD-10-CM | POA: Diagnosis not present

## 2016-09-26 DIAGNOSIS — Z0000 Encounter for general adult medical examination without abnormal findings: Secondary | ICD-10-CM | POA: Diagnosis not present

## 2016-09-26 DIAGNOSIS — D696 Thrombocytopenia, unspecified: Secondary | ICD-10-CM

## 2016-09-26 DIAGNOSIS — F0631 Mood disorder due to known physiological condition with depressive features: Secondary | ICD-10-CM | POA: Diagnosis not present

## 2016-09-26 DIAGNOSIS — I69359 Hemiplegia and hemiparesis following cerebral infarction affecting unspecified side: Secondary | ICD-10-CM | POA: Diagnosis not present

## 2016-09-26 DIAGNOSIS — IMO0002 Reserved for concepts with insufficient information to code with codable children: Secondary | ICD-10-CM

## 2016-09-26 DIAGNOSIS — F331 Major depressive disorder, recurrent, moderate: Secondary | ICD-10-CM | POA: Diagnosis not present

## 2016-09-26 NOTE — Assessment & Plan Note (Signed)
I have personally reviewed the Medicare Annual Wellness questionnaire and have noted 1. The patient's medical and social history 2. Their use of alcohol, tobacco or illicit drugs 3. Their current medications and supplements 4. The patient's functional ability including ADL's, fall risks, home safety risks and hearing or visual             impairment. 5. Diet and physical activities 6. Evidence for depression or mood disorders  The patients weight, height, BMI and visual acuity have been recorded in the chart I have made referrals, counseling and provided education to the patient based review of the above and I have provided the pt with a written personalized care plan for preventive services.  I have provided you with a copy of your personalized plan for preventive services. Please take the time to review along with your updated medication list.  Colon due 2023 if still appropriate Recent PSA normal Discussed getting back to exercise Yearly flu vaccine

## 2016-09-26 NOTE — Assessment & Plan Note (Signed)
No abnormal bleeding

## 2016-09-26 NOTE — Assessment & Plan Note (Signed)
Ongoing symptoms but nothing persistent  will continue current meds

## 2016-09-26 NOTE — Assessment & Plan Note (Signed)
Mild symptoms No Rx for now 

## 2016-09-26 NOTE — Progress Notes (Signed)
Pre visit review using our clinic review tool, if applicable. No additional management support is needed unless otherwise documented below in the visit note. 

## 2016-09-26 NOTE — Assessment & Plan Note (Signed)
See social history 

## 2016-09-26 NOTE — Progress Notes (Signed)
Subjective:    Patient ID: Keith Hall, male    DOB: 1945/11/15, 71 y.o.   MRN: [id number]  HPI Here for Medicare wellness and follow up of chronic health conditions Reviewed form and advanced directives Reviewed other doctors No alcohol or tobacco [facility]--but [facility] some weeks ago Vision is okay since cataract surgery Hearing is poor--- uses hearing aides Did fall once this year--fortunately onto bed while making it Some memory problems---nothing worrisome Still drives. Uses shower chair. He vacuums, dusts and does dishes---wife does the cooking  Having problems with right shoulder and ?scapula "I felt like I tore something" Pain shoots through with certain movement Has good ROM in arm though  Some trouble with balance--since stroke but now ?worse Has stopped [facility] ?due to his depression Left side is still weak--needs to walk with cane Still will drop things with left hand  Has had some chronic depression since his stroke Still has intermittent symptoms "that can be bad" He improves within the day--as long as he keeps busy and distracts himself  No excessive bleeding or bruising No blood in stool, urine or with brushing teeth  Has some urinary urgency Rare incontinence Nocturia x 1-2  Current Outpatient Prescriptions on File Prior to Visit  Medication Sig Dispense Refill  . acetaminophen (TYLENOL) 500 MG tablet Take 500 mg by mouth 3 (three) times daily as needed.     Marland Kitchen aspirin 325 MG EC tablet Take 1 tablet (325 mg total) by mouth daily. 90 tablet 1  . atorvastatin (LIPITOR) 40 MG tablet Take 1 tablet (40 mg total) by mouth daily. 90 tablet 3  . buPROPion (WELLBUTRIN XL) 300 MG 24 hr tablet TAKE 1 TABLET (300 MG TOTAL) BY MOUTH DAILY. 30 tablet 0  . citalopram (CELEXA) 20 MG tablet Take 1 tablet (20 mg total) by mouth daily. 90 tablet 3  . clonazePAM (KLONOPIN) 0.5 MG tablet TAKE 1/2 - 1 TABLET EVERY DAY AT BEDTIME AS NEEDED FOR ANXIETY  90 tablet 0  . gabapentin (NEURONTIN) 100 MG capsule Take 200 mg by mouth at bedtime.    . Glucosamine-Chondroitin (MOVE FREE PO) Take 2 tablets by mouth daily.     No current facility-administered medications on file prior to visit.     Allergies  Allergen Reactions  . Penicillins Swelling    Past Medical History:  Diagnosis Date  . BPH (benign prostatic hypertrophy)   . Depression 1980's04/03/2003   MDD---even needed hospitalization  . 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 Kidney stone 1980 only  . Osteoarthritis of both knees   . Stroke ([place])   . Sudden cardiac death [facility]) September 25, 1999   no clear etiology of this    Past Surgical History:  Procedure Laterality Date  . CARDIAC CATHETERIZATION  last in 09-25-2007   insignificant coronary disease  . CARDIOVASCULAR STRESS TEST  07/09/11   Negative--Dr Paraschos  . HERNIA REPAIR  2010/09/25   left inguinal--Dr Byrnett  . ROTATOR CUFF REPAIR  Sep 25, 2007   both done by Dr Margaretmary Eddy  . TOTAL KNEE ARTHROPLASTY Right 2/14   Dr Marry Guan  . TOTAL KNEE ARTHROPLASTY Left 1/15   Dr [facility]    Family History  Problem Relation Age of Onset  . Stroke Mother   . COPD Sister   . Diabetes Son     Type 1  . Heart disease Neg Hx   .  Hypertension Neg Hx     Social History   Social History  . Marital status: Married    Spouse name: N/A  . Number of children: 1  . Years of education: N/A   Occupational History  . Outside sales of furniture     Retired at [employer]  . Smoking status: Never Smoker  . Smokeless tobacco: Never Used  . Alcohol use No  . Drug use: No  . Sexual activity: Not on file   Other Topics Concern  . Not on file   Social History Narrative   1 son   Has living will   Wife, then son, have health care POA   Would accept resuscitation attempts but no prolonged artificial ventilation   Would not want tube feeds if cognitively unaware    Review  of Systems Some pain in left mid back when he moves his bowels. No blood. No pain otherwise Appetite is fine Weight is stable Sleeps fairly well Wears seat belt Teeth okay--- keeps up with dentist (Dr Kenton Kingfisher) No other significant arthritis pain. Some numbness in right thumb/2nd finger at times (uses them for cellphone) Bowels are regular No heartburn or dysphagia    Objective:   Physical Exam  Constitutional: He appears well-nourished. No distress.  HENT:  Mouth/Throat: Oropharynx is clear and moist. No oropharyngeal exudate.  Neck: No thyromegaly present.  Cardiovascular: Normal rate, regular rhythm and intact distal pulses.  Exam reveals no gallop.   Gr 3/6 systolic murmur loudest at LSB  Pulmonary/Chest: Effort normal and breath sounds normal. No respiratory distress. He has no wheezes. He has no rales.  Abdominal: Soft. There is no tenderness.  Musculoskeletal: He exhibits no edema or tenderness.  Lymphadenopathy:    He has no cervical adenopathy.  Neurological: He is alert.  "March 2018" "[place] [facility] on [address]" President-- "Daisy Floro, Obama, Tawni Pummel" [id number] Recall 3/3  Weakness in left hand > left leg  Skin: No rash noted. No erythema.  Psychiatric: He has a normal mood and affect. His behavior is normal.          Assessment & Plan:

## 2016-09-26 NOTE — Assessment & Plan Note (Signed)
Mood affected by his limitations On meds

## 2016-09-26 NOTE — Assessment & Plan Note (Signed)
Ongoing left side weakness

## 2016-09-27 ENCOUNTER — Other Ambulatory Visit: Payer: Self-pay | Admitting: Internal Medicine

## 2016-09-27 DIAGNOSIS — R7989 Other specified abnormal findings of blood chemistry: Secondary | ICD-10-CM

## 2016-09-27 DIAGNOSIS — R945 Abnormal results of liver function studies: Principal | ICD-10-CM

## 2016-09-27 LAB — CBC WITH DIFFERENTIAL/PLATELET
Basophils Absolute: 0.1 10*3/uL (ref 0.0–0.1)
Basophils Relative: 1.2 % (ref 0.0–3.0)
EOS PCT: 2 % (ref 0.0–5.0)
Eosinophils Absolute: 0.1 10*3/uL (ref 0.0–0.7)
HEMATOCRIT: 44.2 % (ref 39.0–52.0)
Hemoglobin: 14.8 g/dL (ref 13.0–17.0)
LYMPHS ABS: 0.9 10*3/uL (ref 0.7–4.0)
LYMPHS PCT: 12.4 % (ref 12.0–46.0)
MCHC: 33.5 g/dL (ref 30.0–36.0)
MCV: 92 fl (ref 78.0–100.0)
MONOS PCT: 9.8 % (ref 3.0–12.0)
Monocytes Absolute: 0.7 10*3/uL (ref 0.1–1.0)
NEUTROS PCT: 74.6 % (ref 43.0–77.0)
Neutro Abs: 5.2 10*3/uL (ref 1.4–7.7)
Platelets: 163 10*3/uL (ref 150.0–400.0)
RBC: 4.81 Mil/uL (ref 4.22–5.81)
RDW: 13.3 % (ref 11.5–15.5)
WBC: 7 10*3/uL (ref 4.0–10.5)

## 2016-09-27 LAB — COMPREHENSIVE METABOLIC PANEL
ALBUMIN: 4.2 g/dL (ref 3.5–5.2)
ALK PHOS: 70 U/L (ref 39–117)
ALT: 121 U/L — ABNORMAL HIGH (ref 0–53)
AST: 130 U/L — AB (ref 0–37)
BUN: 19 mg/dL (ref 6–23)
CALCIUM: 9.4 mg/dL (ref 8.4–10.5)
CO2: 27 mEq/L (ref 19–32)
Chloride: 103 mEq/L (ref 96–112)
Creatinine, Ser: 0.85 mg/dL (ref 0.40–1.50)
GFR: 93.93 mL/min (ref >60.00)
Glucose, Bld: 88 mg/dL (ref 70–99)
Potassium: 4.2 mEq/L (ref 3.5–5.1)
SODIUM: 137 meq/L (ref 135–145)
TOTAL PROTEIN: 6.7 g/dL (ref 6.0–8.3)
Total Bilirubin: 0.6 mg/dL (ref 0.2–1.2)

## 2016-09-27 LAB — LIPID PANEL
CHOL/HDL RATIO: 3
Cholesterol: 142 mg/dL (ref 0–200)
HDL: 48 mg/dL (ref >39.00)
LDL CALC: 55 mg/dL (ref 0–99)
NONHDL: 94.24
Triglycerides: 194 mg/dL — ABNORMAL HIGH (ref 0.0–149.0)
VLDL: 38.8 mg/dL (ref 0.0–40.0)

## 2016-09-30 ENCOUNTER — Other Ambulatory Visit (INDEPENDENT_AMBULATORY_CARE_PROVIDER_SITE_OTHER): Payer: PPO

## 2016-09-30 DIAGNOSIS — R7989 Other specified abnormal findings of blood chemistry: Secondary | ICD-10-CM

## 2016-09-30 DIAGNOSIS — R945 Abnormal results of liver function studies: Principal | ICD-10-CM

## 2016-10-01 LAB — HEPATITIS PANEL, ACUTE
HCV AB: NEGATIVE
Hep A IgM: NONREACTIVE
Hep B C IgM: NONREACTIVE
Hepatitis B Surface Ag: NEGATIVE

## 2016-10-03 ENCOUNTER — Ambulatory Visit
Admission: RE | Admit: 2016-10-03 | Discharge: 2016-10-03 | Disposition: A | Discharge status: Home / Self Care | Payer: PPO | Source: Ambulatory Visit | Attending: Internal Medicine | Admitting: Internal Medicine

## 2016-10-03 ENCOUNTER — Other Ambulatory Visit: Payer: Self-pay | Admitting: Internal Medicine

## 2016-10-03 DIAGNOSIS — R945 Abnormal results of liver function studies: Principal | ICD-10-CM

## 2016-10-03 DIAGNOSIS — R7989 Other specified abnormal findings of blood chemistry: Secondary | ICD-10-CM | POA: Diagnosis not present

## 2016-10-03 DIAGNOSIS — K8020 Calculus of gallbladder without cholecystitis without obstruction: Secondary | ICD-10-CM | POA: Diagnosis not present

## 2016-10-09 ENCOUNTER — Other Ambulatory Visit: Payer: Self-pay | Admitting: Internal Medicine

## 2016-10-30 ENCOUNTER — Telehealth: Payer: Self-pay

## 2016-10-30 ENCOUNTER — Other Ambulatory Visit (INDEPENDENT_AMBULATORY_CARE_PROVIDER_SITE_OTHER): Payer: PPO

## 2016-10-30 DIAGNOSIS — R945 Abnormal results of liver function studies: Principal | ICD-10-CM

## 2016-10-30 DIAGNOSIS — R7989 Other specified abnormal findings of blood chemistry: Secondary | ICD-10-CM

## 2016-10-30 LAB — HEPATIC FUNCTION PANEL
ALBUMIN: 4.4 g/dL (ref 3.5–5.2)
ALK PHOS: 66 U/L (ref 39–117)
ALT: 97 U/L — ABNORMAL HIGH (ref 0–53)
AST: 110 U/L — ABNORMAL HIGH (ref 0–37)
Bilirubin, Direct: 0.2 mg/dL (ref 0.0–0.3)
Total Bilirubin: 1 mg/dL (ref 0.2–1.2)
Total Protein: 7 g/dL (ref 6.0–8.3)

## 2016-10-30 NOTE — Telephone Encounter (Signed)
Let them know plan

## 2016-10-30 NOTE — Telephone Encounter (Signed)
Pt's wife returned phone call. Pt returned home. Confirmed pt has discontinued taking cholesterol medication until further instruction from PCP. Advised pt about future appointment as well as reason for lab work today. Pt verbalized understanding.

## 2016-10-30 NOTE — Telephone Encounter (Signed)
Spoke to  pt's wife .

## 2016-10-30 NOTE — Telephone Encounter (Signed)
Attempted to reach pt to discuss reason for lab work and the details of future appt scheduled.   If pt returns phone call, please tell him that lab work today was for a hepatic function panel. Please ensure pt has stopped cholesterol medication per PCP instruction. Also, pls advise pt he has a 6 mth f/u appt scheduled in Oct 2018. Pls see appt desk screen for specific details.

## 2016-10-30 NOTE — Telephone Encounter (Signed)
If LFTs still abnormal, will restart the atorvastatin. If down, will change to crestor and monitor liver function

## 2016-10-31 ENCOUNTER — Telehealth: Payer: Self-pay

## 2016-10-31 NOTE — Telephone Encounter (Signed)
Took atorvastatin off off his list based on recent lab results

## 2016-12-03 DIAGNOSIS — Z9889 Other specified postprocedural states: Secondary | ICD-10-CM | POA: Diagnosis not present

## 2016-12-03 DIAGNOSIS — E782 Mixed hyperlipidemia: Secondary | ICD-10-CM | POA: Diagnosis not present

## 2016-12-03 DIAGNOSIS — I469 Cardiac arrest, cause unspecified: Secondary | ICD-10-CM | POA: Diagnosis not present

## 2016-12-23 ENCOUNTER — Encounter: Payer: Self-pay | Admitting: Internal Medicine

## 2016-12-23 ENCOUNTER — Ambulatory Visit (INDEPENDENT_AMBULATORY_CARE_PROVIDER_SITE_OTHER): Payer: PPO | Admitting: Internal Medicine

## 2016-12-23 VITALS — BP 104/68 | HR 72 | Temp 98.0°F | Wt 199.0 lb

## 2016-12-23 DIAGNOSIS — E298 Other testicular dysfunction: Secondary | ICD-10-CM

## 2016-12-23 NOTE — Progress Notes (Signed)
Subjective:    Patient ID: Keith Hall, male    DOB: 1946/03/13, 71 y.o.   MRN: [id number]  HPI Here due to concerns that his testicles have decreased in size It may go back for a lot longer, but really noticed that they were much smaller like 2 weeks ago  Not much libido since stroke (about 1.5 years ago) Voids okay--but notes frequency No incontinence but has to rush at times  Current Outpatient Prescriptions on File Prior to Visit  Medication Sig Dispense Refill  . acetaminophen (TYLENOL) 500 MG tablet Take 500 mg by mouth 3 (three) times daily as needed.     Marland Kitchen aspirin 325 MG EC tablet Take 1 tablet (325 mg total) by mouth daily. 90 tablet 1  . buPROPion (WELLBUTRIN XL) 300 MG 24 hr tablet TAKE 1 TABLET (300 MG TOTAL) BY MOUTH DAILY. 30 tablet 11  . citalopram (CELEXA) 20 MG tablet Take 1 tablet (20 mg total) by mouth daily. 90 tablet 3  . clonazePAM (KLONOPIN) 0.5 MG tablet TAKE 1/2 - 1 TABLET EVERY DAY AT BEDTIME AS NEEDED FOR ANXIETY 90 tablet 0  . gabapentin (NEURONTIN) 100 MG capsule Take 200 mg by mouth at bedtime.    . Glucosamine-Chondroitin (MOVE FREE PO) Take 2 tablets by mouth daily.     No current facility-administered medications on file prior to visit.     Allergies  Allergen Reactions  . Penicillins Swelling    Past Medical History:  Diagnosis Date  . BPH (benign prostatic hypertrophy)   . Depression 1980'sApr 17, 2004   MDD---even needed hospitalization  . 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 Kidney stone 1980 only  . Osteoarthritis of both knees   . Stroke ([place])   . Sudden cardiac death [facility]) 10/02/99   no clear etiology of this    Past Surgical History:  Procedure Laterality Date  . CARDIAC CATHETERIZATION  last in 10/02/2007   insignificant coronary disease  . CARDIOVASCULAR STRESS TEST  07/09/11   Negative--Dr Paraschos  . HERNIA REPAIR  02-Oct-2010   left inguinal--Dr Byrnett  . ROTATOR CUFF REPAIR   10-02-07   both done by Dr Margaretmary Eddy  . TOTAL KNEE ARTHROPLASTY Right 2/14   Dr Marry Guan  . TOTAL KNEE ARTHROPLASTY Left 1/15   Dr [facility]    Family History  Problem Relation Age of Onset  . Stroke Mother   . COPD Sister   . Diabetes Son        Type 1  . Heart disease Neg Hx   . Hypertension Neg Hx     Social History   Social History  . Marital status: Married    Spouse name: N/A  . Number of children: 1  . Years of education: N/A   Occupational History  . Outside sales of furniture     Retired at [employer]  . Smoking status: Never Smoker  . Smokeless tobacco: Never Used  . Alcohol use No  . Drug use: No  . Sexual activity: Not on file   Other Topics Concern  . Not on file   Social History Narrative   1 son   Has living will   Wife, then son, have health care POA   Would accept resuscitation attempts but no prolonged artificial ventilation   Would not want tube feeds if cognitively unaware   Review of Systems  No fever Hasn't  felt sick No pain     Objective:   Physical Exam  Genitourinary:  Genitourinary Comments: Fairly normal testicular size Slightly sensitive on right No scrotal findings          Assessment & Plan:

## 2016-12-23 NOTE — Assessment & Plan Note (Signed)
Has noticed a significant decrease in size but still seems to be in normal range Seems to have normal for age testicular hypofunction--discussed that this can be normal Reassured --no action is needed

## 2017-02-13 ENCOUNTER — Telehealth: Payer: Self-pay

## 2017-02-13 NOTE — Telephone Encounter (Signed)
Mrs Bracco said [facility] requesting refill citalopram; pt should have med thru mid Nov 2018. Mrs Hussar will ck with pharmacy and cb if needed.

## 2017-02-18 ENCOUNTER — Other Ambulatory Visit: Payer: Self-pay | Admitting: Internal Medicine

## 2017-03-06 ENCOUNTER — Ambulatory Visit (INDEPENDENT_AMBULATORY_CARE_PROVIDER_SITE_OTHER): Payer: PPO

## 2017-03-06 DIAGNOSIS — Z23 Encounter for immunization: Secondary | ICD-10-CM

## 2017-03-31 ENCOUNTER — Ambulatory Visit (INDEPENDENT_AMBULATORY_CARE_PROVIDER_SITE_OTHER): Payer: PPO | Admitting: Internal Medicine

## 2017-03-31 ENCOUNTER — Encounter: Payer: Self-pay | Admitting: Internal Medicine

## 2017-03-31 VITALS — BP 124/70 | HR 86 | Temp 98.0°F | Wt 199.0 lb

## 2017-03-31 DIAGNOSIS — I6523 Occlusion and stenosis of bilateral carotid arteries: Secondary | ICD-10-CM | POA: Diagnosis not present

## 2017-03-31 DIAGNOSIS — I739 Peripheral vascular disease, unspecified: Secondary | ICD-10-CM

## 2017-03-31 DIAGNOSIS — I69359 Hemiplegia and hemiparesis following cerebral infarction affecting unspecified side: Secondary | ICD-10-CM | POA: Diagnosis not present

## 2017-03-31 DIAGNOSIS — R7989 Other specified abnormal findings of blood chemistry: Secondary | ICD-10-CM

## 2017-03-31 DIAGNOSIS — R945 Abnormal results of liver function studies: Secondary | ICD-10-CM

## 2017-03-31 DIAGNOSIS — I779 Disorder of arteries and arterioles, unspecified: Secondary | ICD-10-CM | POA: Insufficient documentation

## 2017-03-31 LAB — LIPID PANEL
CHOLESTEROL: 200 mg/dL (ref 0–200)
HDL: 46.2 mg/dL (ref >39.00)
LDL CALC: 129 mg/dL — AB (ref 0–99)
NonHDL: 154.14
Total CHOL/HDL Ratio: 4
Triglycerides: 128 mg/dL (ref 0.0–149.0)
VLDL: 25.6 mg/dL (ref 0.0–40.0)

## 2017-03-31 LAB — HEPATIC FUNCTION PANEL
ALK PHOS: 66 U/L (ref 39–117)
ALT: 77 U/L — AB (ref 0–53)
AST: 81 U/L — AB (ref 0–37)
Albumin: 4.2 g/dL (ref 3.5–5.2)
BILIRUBIN DIRECT: 0.1 mg/dL (ref 0.0–0.3)
BILIRUBIN TOTAL: 0.8 mg/dL (ref 0.2–1.2)
Total Protein: 6.8 g/dL (ref 6.0–8.3)

## 2017-03-31 NOTE — Progress Notes (Signed)
Subjective:    Patient ID: Keith Hall, male    DOB: Mar 26, 1946, 71 y.o.   MRN: [id number]  HPI Here with wife for follow up of chronic medical conditions  Ongoing mild problems with left hand Weakness and functional problems--but doing fairly well Some numbness in left hand and he has felt a knot (not now though)---seems like it may be part of proximal metatarsal  Mood is good Sleeping well  No major chest pain---just some stinging pain along left sternum Brief and usually at rest (sitting or in bed) No heartburn No SOB  Reviewed carotid studies Mild bilateral disease Cholesterol levels are low On aspirin  Reviewed LFTs No symptoms  Current Outpatient Prescriptions on File Prior to Visit  Medication Sig Dispense Refill  . acetaminophen (TYLENOL) 500 MG tablet Take 500 mg by mouth 3 (three) times daily as needed.     Marland Kitchen aspirin 325 MG EC tablet Take 1 tablet (325 mg total) by mouth daily. 90 tablet 1  . buPROPion (WELLBUTRIN XL) 300 MG 24 hr tablet TAKE 1 TABLET (300 MG TOTAL) BY MOUTH DAILY. 30 tablet 11  . citalopram (CELEXA) 20 MG tablet Take 1 tablet by mouth daily 90 tablet 2  . clonazePAM (KLONOPIN) 0.5 MG tablet TAKE 1/2 - 1 TABLET EVERY DAY AT BEDTIME AS NEEDED FOR ANXIETY 90 tablet 0  . gabapentin (NEURONTIN) 100 MG capsule Take 200 mg by mouth at bedtime.    . Glucosamine-Chondroitin (MOVE FREE PO) Take 2 tablets by mouth daily.     No current facility-administered medications on file prior to visit.     Allergies  Allergen Reactions  . Penicillins Swelling    Past Medical History:  Diagnosis Date  . BPH (benign prostatic hypertrophy)   . Depression 1980's04-15-2004   MDD---even needed hospitalization  . 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 Kidney stone 1980 only  . Osteoarthritis of both knees   . Stroke ([place])   . Sudden cardiac death [facility]) 1999/09/30   no clear etiology of this    Past Surgical  History:  Procedure Laterality Date  . CARDIAC CATHETERIZATION  last in 09-30-2007   insignificant coronary disease  . CARDIOVASCULAR STRESS TEST  07/09/11   Negative--Dr Paraschos  . HERNIA REPAIR  09-30-10   left inguinal--Dr Byrnett  . ROTATOR CUFF REPAIR  09-30-2007   both done by Dr Margaretmary Eddy  . TOTAL KNEE ARTHROPLASTY Right 2/14   Dr Marry Guan  . TOTAL KNEE ARTHROPLASTY Left 1/15   Dr [facility]    Family History  Problem Relation Age of Onset  . Stroke Mother   . COPD Sister   . Diabetes Son        Type 1  . Heart disease Neg Hx   . Hypertension Neg Hx     Social History   Social History  . Marital status: Married    Spouse name: N/A  . Number of children: 1  . Years of education: N/A   Occupational History  . Outside sales of furniture     Retired at [employer]  . Smoking status: Never Smoker  . Smokeless tobacco: Never Used  . Alcohol use No  . Drug use: No  . Sexual activity: Not on file   Other Topics Concern  . Not on file   Social History Narrative   1 son   Has living will  Wife, then son, have health care POA   Would accept resuscitation attempts but no prolonged artificial ventilation   Would not want tube feeds if cognitively unaware   Review of Systems Appetite is good Weight down 6# since April--trying to be careful    Objective:   Physical Exam  Constitutional: He appears well-nourished. No distress.  Neck: No thyromegaly present.  Cardiovascular: Normal rate, regular rhythm and normal heart sounds.  Exam reveals no gallop.   No murmur heard. Pulmonary/Chest: Effort normal and breath sounds normal. No respiratory distress. He has no wheezes. He has no rales.  Abdominal: Soft. He exhibits no distension. There is no tenderness. There is no rebound and no guarding.  No HSM  Musculoskeletal: He exhibits no edema.  Lymphadenopathy:    He has no cervical adenopathy.          Assessment & Plan:

## 2017-03-31 NOTE — Assessment & Plan Note (Signed)
Off the atorvastatin---if normalized, that will be the answer If not, ultrasound and viral studies negative

## 2017-03-31 NOTE — Assessment & Plan Note (Signed)
Discussed secondary prevention with statin If LFTs normal, will try low dose rosuvastatin

## 2017-03-31 NOTE — Assessment & Plan Note (Signed)
Mild disease bilaterally  Discussed secondary prevention

## 2017-04-03 ENCOUNTER — Other Ambulatory Visit: Payer: Self-pay

## 2017-04-03 MED ORDER — ATORVASTATIN CALCIUM 20 MG PO TABS: 20.0000 mg | ORAL_TABLET | Freq: Every day | ORAL | 3 refills | Status: DC

## 2017-04-21 ENCOUNTER — Telehealth: Payer: Self-pay | Admitting: Internal Medicine

## 2017-04-21 NOTE — Telephone Encounter (Signed)
Patient notified for is ready for pick up and no charge.

## 2017-04-21 NOTE — Telephone Encounter (Signed)
Form done No charge 

## 2017-04-21 NOTE — Telephone Encounter (Signed)
Pt dropped of PT forms Pt stated he needed asap Forms in dr Everardo Beals in box [employer] number [phone] or [phone]

## 2017-04-23 ENCOUNTER — Telehealth: Payer: Self-pay

## 2017-04-23 NOTE — Telephone Encounter (Signed)
Pt's wife sent a message through her MyChart for the pt: I personally know someone who had a bloodshot eye and had a brain bleed as the cause. 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 Do you think this should be looked at or should I just watch him? A reply ASAP is greatly appreciated. Thank you [name]

## 2017-04-23 NOTE — Telephone Encounter (Addendum)
Spoke to pt's wife. She was concerned but felt better after talking to [place].

## 2017-04-23 NOTE — Telephone Encounter (Signed)
Usually that is not the case (actually I have not heard that). Redness in the eye is generally from a broken blood vessel in the front of the eye---most often from hard coughing, sneezing or straining moving bowels. If he is otherwise okay, no need for visit (unless he has discharge, etc to suggest pink eye)

## 2017-05-03 ENCOUNTER — Other Ambulatory Visit: Payer: Self-pay | Admitting: Internal Medicine

## 2017-05-29 ENCOUNTER — Telehealth: Payer: Self-pay | Admitting: Internal Medicine

## 2017-05-29 MED ORDER — PREDNISONE 20 MG PO TABS: 40.0000 mg | ORAL_TABLET | Freq: Every day | ORAL | 0 refills | Status: AC

## 2017-05-29 NOTE — Telephone Encounter (Signed)
He is not paralyzed--just has some hand weakness. Okay to send order for prednisone 20mg  ---- 2 daily for 3 days (#6 x 0) If he is not much better by next week, needs to be seen

## 2017-05-29 NOTE — Telephone Encounter (Signed)
See attached.  Pt can't get out and requesting Prednisone for an injured back.

## 2017-05-29 NOTE — Addendum Note (Signed)
Addended by: Pilar Grammes on: 05/29/2017 03:44 PM   Modules accepted: Orders

## 2017-05-29 NOTE — Telephone Encounter (Signed)
Spoke to June. Sent rx to [facility] at her request.

## 2017-05-29 NOTE — Telephone Encounter (Signed)
Copied from [place]. Topic: Inquiry >> May 29, 2017  9:45 AM Neva Seat wrote: Pt is snowed in and cannot come to the office for visit.  Pt has hurt his back and is requesting Prednisone 20 mg -  4 pills to help with pain.  Please call him to let him know if these can be called in.

## 2017-05-29 NOTE — Telephone Encounter (Signed)
I spoke with pts wife; pt is paralyzed on lt side due to stroke and pt moved wrong and needs prednisone sent to [place] for back pain. Has had pain since first of week and cannot get out due to snow. pts neighbor will pick up rx and please call Mrs. Petsch when med is called in. Last seen 03/31/17.Please advise.

## 2017-06-16 DIAGNOSIS — Z9889 Other specified postprocedural states: Secondary | ICD-10-CM | POA: Diagnosis not present

## 2017-06-16 DIAGNOSIS — I469 Cardiac arrest, cause unspecified: Secondary | ICD-10-CM | POA: Diagnosis not present

## 2017-06-16 DIAGNOSIS — Z8679 Personal history of other diseases of the circulatory system: Secondary | ICD-10-CM | POA: Diagnosis not present

## 2017-06-27 DIAGNOSIS — Z961 Presence of intraocular lens: Secondary | ICD-10-CM | POA: Diagnosis not present

## 2017-07-14 ENCOUNTER — Ambulatory Visit (INDEPENDENT_AMBULATORY_CARE_PROVIDER_SITE_OTHER): Payer: PPO | Admitting: Internal Medicine

## 2017-07-14 ENCOUNTER — Encounter: Payer: Self-pay | Admitting: Internal Medicine

## 2017-07-14 VITALS — BP 122/70 | HR 66 | Temp 97.7°F | Wt 202.2 lb

## 2017-07-14 DIAGNOSIS — M545 Low back pain, unspecified: Secondary | ICD-10-CM

## 2017-07-14 MED ORDER — TIZANIDINE HCL 2 MG PO TABS: 2.0000 mg | ORAL_TABLET | Freq: Every evening | ORAL | 0 refills | Status: DC | PRN

## 2017-07-14 MED ORDER — PREDNISONE 20 MG PO TABS: 40.0000 mg | ORAL_TABLET | Freq: Every day | ORAL | 1 refills | Status: DC

## 2017-07-14 NOTE — Assessment & Plan Note (Signed)
Lumbosacral sprain on left Nothing to suggest HNP Will treat with prednisone--this has helped Tizanidine bedtime prn

## 2017-07-14 NOTE — Progress Notes (Signed)
Subjective:    Patient ID: Keith Hall, male    DOB: 07-29-1945, 72 y.o.   MRN: [id number]  HPI Here due to back pain--with wife  Was sitting in low chair and tried to put on socks--yesterday Sudden "dagger like" pain in left lumbar area and across spine Hard to walk due to persistent pain Using cane  Fairly comfortable in bed No meds other than parafon forte from wife--may have helped  Has thrown his back out in the past--just like this Prednisone usually helps  Current Outpatient Medications on File Prior to Visit  Medication Sig Dispense Refill  . acetaminophen (TYLENOL) 500 MG tablet Take 500 mg by mouth 3 (three) times daily as needed.     Marland Kitchen aspirin 325 MG EC tablet Take 1 tablet (325 mg total) by mouth daily. 90 tablet 1  . atorvastatin (LIPITOR) 20 MG tablet Take 1 tablet (20 mg total) by mouth daily. 90 tablet 3  . buPROPion (WELLBUTRIN XL) 300 MG 24 hr tablet TAKE 1 TABLET (300 MG TOTAL) BY MOUTH DAILY. 30 tablet 11  . citalopram (CELEXA) 20 MG tablet TAKE 1 TABLET (20 MG TOTAL) BY MOUTH DAILY. 90 tablet 1  . clonazePAM (KLONOPIN) 0.5 MG tablet TAKE 1/2 - 1 TABLET EVERY DAY AT BEDTIME AS NEEDED FOR ANXIETY 90 tablet 0  . gabapentin (NEURONTIN) 100 MG capsule Take 200 mg by mouth at bedtime.    . Glucosamine-Chondroitin (MOVE FREE PO) Take 2 tablets by mouth daily.    . Multiple Vitamin (MULTIVITAMIN) tablet Take 1 tablet by mouth daily.     No current facility-administered medications on file prior to visit.     Allergies  Allergen Reactions  . Penicillins Swelling    Past Medical History:  Diagnosis Date  . BPH (benign prostatic hypertrophy)   . Depression 1980's03-31-04   MDD---even needed hospitalization  . 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 Kidney stone 1980 only  . Osteoarthritis of both knees   . Stroke ([place])   . Sudden cardiac death [facility]) 09-15-99   no clear etiology of this    Past  Surgical History:  Procedure Laterality Date  . CARDIAC CATHETERIZATION  last in 09-15-2007   insignificant coronary disease  . CARDIOVASCULAR STRESS TEST  07/09/11   Negative--Dr Paraschos  . HERNIA REPAIR  September 15, 2010   left inguinal--Dr Byrnett  . ROTATOR CUFF REPAIR  09-15-2007   both done by Dr Margaretmary Eddy  . TOTAL KNEE ARTHROPLASTY Right 2/14   Dr Marry Guan  . TOTAL KNEE ARTHROPLASTY Left 1/15   Dr [facility]    Family History  Problem Relation Age of Onset  . Stroke Mother   . COPD Sister   . Diabetes Son        Type 1  . Heart disease Neg Hx   . Hypertension Neg Hx     Social History   Socioeconomic History  . Marital status: Married    Spouse name: Not on file  . Number of children: 1  . Years of education: Not on file  . Highest education level: Not on file  Social Needs  . Financial resource strain: Not on file  . Food insecurity - worry: Not on file  . Food insecurity - inability: Not on file  . Transportation needs - medical: Not on file  . Transportation needs - non-medical: Not on file  Occupational History  . Occupation: Outside Press photographer of  furniture    Comment: Retired at 21  Tobacco Use  . Smoking status: Never Smoker  . Smokeless tobacco: Never Used  Substance and Sexual Activity  . Alcohol use: No  . Drug use: No  . Sexual activity: Not on file  Other Topics Concern  . Not on file  Social History Narrative   1 son   Has living will   Wife, then son, have health care POA   Would accept resuscitation attempts but no prolonged artificial ventilation   Would not want tube feeds if cognitively unaware   Review of Systems No loss of bladder of bowel control Some increased urinary frequency No fever    Objective:   Physical Exam  Constitutional: No distress.  Musculoskeletal:  No spine tenderness Tenderness along left lumbosacral paraspinals SLR negative to 90 degrees Hip ROM is fairly good and doesn't stimulate pain  Neurological:  Antalgic gait but not  ataxic No leg weakness Normal tone          Assessment & Plan:

## 2017-07-15 ENCOUNTER — Ambulatory Visit: Payer: PPO | Admitting: Internal Medicine

## 2017-09-01 ENCOUNTER — Telehealth: Payer: Self-pay

## 2017-09-01 NOTE — Telephone Encounter (Signed)
Form done I don't think I can order 2 placards--he would have to discuss this with DMV

## 2017-09-01 NOTE — Telephone Encounter (Signed)
I spoke to patient and let him know I'm mailing the form to him.  I let patient know to speak to the [facility] about getting 2 placards.

## 2017-09-01 NOTE — Telephone Encounter (Signed)
Form placed in Dr Letvak's Inbox on his desk 

## 2017-09-01 NOTE — Telephone Encounter (Signed)
Patient came by the office and dropped off handicap form to be filled out please. He dropped off 1 and is wondering if he can get 2 handicap placards. The reason he needs this is unsteady gait, states he had stroke before and is now using a cane to ambulate. Patient has hard time getting around and would like to have this form mailed to his home address please.  Form placed in the RX tower.-Anastasiya V Hopkins, RMA

## 2017-09-18 ENCOUNTER — Other Ambulatory Visit: Payer: Self-pay | Admitting: Internal Medicine

## 2017-09-29 ENCOUNTER — Ambulatory Visit (INDEPENDENT_AMBULATORY_CARE_PROVIDER_SITE_OTHER): Payer: PPO | Admitting: Internal Medicine

## 2017-09-29 ENCOUNTER — Encounter: Payer: Self-pay | Admitting: Internal Medicine

## 2017-09-29 VITALS — BP 124/82 | HR 56 | Temp 98.3°F | Ht 69.0 in | Wt 200.0 lb

## 2017-09-29 DIAGNOSIS — Z7189 Other specified counseling: Secondary | ICD-10-CM | POA: Diagnosis not present

## 2017-09-29 DIAGNOSIS — I69359 Hemiplegia and hemiparesis following cerebral infarction affecting unspecified side: Secondary | ICD-10-CM | POA: Diagnosis not present

## 2017-09-29 DIAGNOSIS — N400 Enlarged prostate without lower urinary tract symptoms: Secondary | ICD-10-CM | POA: Diagnosis not present

## 2017-09-29 DIAGNOSIS — R945 Abnormal results of liver function studies: Secondary | ICD-10-CM

## 2017-09-29 DIAGNOSIS — F331 Major depressive disorder, recurrent, moderate: Secondary | ICD-10-CM

## 2017-09-29 DIAGNOSIS — Z0000 Encounter for general adult medical examination without abnormal findings: Secondary | ICD-10-CM | POA: Diagnosis not present

## 2017-09-29 DIAGNOSIS — R7989 Other specified abnormal findings of blood chemistry: Secondary | ICD-10-CM

## 2017-09-29 LAB — COMPREHENSIVE METABOLIC PANEL
ALBUMIN: 4.3 g/dL (ref 3.5–5.2)
ALK PHOS: 69 U/L (ref 39–117)
ALT: 124 U/L — AB (ref 0–53)
AST: 129 U/L — AB (ref 0–37)
BILIRUBIN TOTAL: 0.8 mg/dL (ref 0.2–1.2)
BUN: 16 mg/dL (ref 6–23)
CO2: 30 mEq/L (ref 19–32)
CREATININE: 0.82 mg/dL (ref 0.40–1.50)
Calcium: 9.5 mg/dL (ref 8.4–10.5)
Chloride: 102 mEq/L (ref 96–112)
GFR: 97.63 mL/min (ref >60.00)
Glucose, Bld: 76 mg/dL (ref 70–99)
Potassium: 4.4 mEq/L (ref 3.5–5.1)
SODIUM: 137 meq/L (ref 135–145)
TOTAL PROTEIN: 6.9 g/dL (ref 6.0–8.3)

## 2017-09-29 LAB — LIPID PANEL
Cholesterol: 147 mg/dL (ref 0–200)
HDL: 49.1 mg/dL (ref >39.00)
LDL Cholesterol: 67 mg/dL (ref 0–99)
NonHDL: 98.03
Total CHOL/HDL Ratio: 3
Triglycerides: 153 mg/dL — ABNORMAL HIGH (ref 0.0–149.0)
VLDL: 30.6 mg/dL (ref 0.0–40.0)

## 2017-09-29 LAB — CBC
HCT: 42.7 % (ref 39.0–52.0)
HEMOGLOBIN: 14.8 g/dL (ref 13.0–17.0)
MCHC: 34.6 g/dL (ref 30.0–36.0)
MCV: 90.9 fl (ref 78.0–100.0)
Platelets: 150 10*3/uL (ref 150.0–400.0)
RBC: 4.69 Mil/uL (ref 4.22–5.81)
RDW: 13.1 % (ref 11.5–15.5)
WBC: 6.4 10*3/uL (ref 4.0–10.5)

## 2017-09-29 NOTE — Progress Notes (Signed)
Subjective:    Patient ID: Keith Hall, male    DOB: 06-05-1946, 72 y.o.   MRN: [id number]  HPI Here with wife for Medicare wellness visit and follow up of chronic health conditions Reviewed form and advanced directives Reviewed other doctors No alcohol or tobacco Hasn't been exercising Did have a fall about a month ago--- was cleaning off mailbox post and slipped off driveway---scraped up face Vision is okay Poor hearing--having trouble with hearing aides Still drives. Helps wife with housework--vacuuming, dusting, etc Chronic memory issues--but no apparent worsening  Is concerned about the liver issue Feels tired all the time---wants to sleep Sleeps okay at night Quit the gym and not really exercising  Continues to be depressed Has been anhedonic to some degree--- but has been involved in a group "[facility] [employer] [place]" at [place] has weakness on his left side from the stroke Continues on the aspirin Left hand is also numb  Occasional pain in lateral left chest  Just at rest---- lasts a minute or so but is not sustained. Not pleuritic No SOB No dizziness or syncope (but takes his time getting out of bed)  Voids okay Some frequency and urgency Nocturia x 2-3 and stable Reasonable flow  Current Outpatient Medications on File Prior to Visit  Medication Sig Dispense Refill  . acetaminophen (TYLENOL) 500 MG tablet Take 500 mg by mouth 3 (three) times daily as needed.     Marland Kitchen aspirin 325 MG EC tablet Take 1 tablet (325 mg total) by mouth daily. 90 tablet 1  . atorvastatin (LIPITOR) 20 MG tablet Take 1 tablet (20 mg total) by mouth daily. 90 tablet 3  . buPROPion (WELLBUTRIN XL) 300 MG 24 hr tablet TAKE 1 TABLET (300 MG TOTAL) BY MOUTH DAILY. 30 tablet 11  . citalopram (CELEXA) 20 MG tablet TAKE 1 TABLET (20 MG TOTAL) BY MOUTH DAILY. 90 tablet 1  . gabapentin (NEURONTIN) 100 MG capsule Take 200 mg by mouth at bedtime.    . Glucosamine-Chondroitin  (MOVE FREE PO) Take 2 tablets by mouth daily.    . Multiple Vitamin (MULTIVITAMIN) tablet Take 1 tablet by mouth daily.     No current facility-administered medications on file prior to visit.     Allergies  Allergen Reactions  . Penicillins Swelling    Past Medical History:  Diagnosis Date  . BPH (benign prostatic hypertrophy)   . Depression 1980's04/26/2004   MDD---even needed hospitalization  . 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 Kidney stone 1980 only  . Osteoarthritis of both knees   . Stroke ([place])   . Sudden cardiac death [facility]) 10/11/99   no clear etiology of this    Past Surgical History:  Procedure Laterality Date  . CARDIAC CATHETERIZATION  last in 2007/10/11   insignificant coronary disease  . CARDIOVASCULAR STRESS TEST  07/09/11   Negative--Dr Paraschos  . HERNIA REPAIR  10/11/2010   left inguinal--Dr Byrnett  . ROTATOR CUFF REPAIR  Oct 11, 2007   both done by Dr Margaretmary Eddy  . TOTAL KNEE ARTHROPLASTY Right 2/14   Dr Marry Guan  . TOTAL KNEE ARTHROPLASTY Left 1/15   Dr [facility]    Family History  Problem Relation Age of Onset  . Stroke Mother   . COPD Sister   . Diabetes Son        Type 1  . Heart disease Neg Hx   . Hypertension Neg  Hx     Social History   Socioeconomic History  . Marital status: Married    Spouse name: Not on file  . Number of children: 1  . Years of education: Not on file  . Highest education level: Not on file  Occupational History  . Occupation: Outside Oncologist    Comment: Retired at [employer]  . Financial resource strain: Not on file  . Food insecurity:    Worry: Not on file    Inability: Not on file  . Transportation needs:    Medical: Not on file    Non-medical: Not on file  Tobacco Use  . Smoking status: Never Smoker  . Smokeless tobacco: Never Used  Substance and Sexual Activity  . Alcohol use: No  . Drug use: No  . Sexual activity: Not on file  Lifestyle    . Physical activity:    Days per week: Not on file    Minutes per session: Not on file  . Stress: Not on file  Relationships  . Social connections:    Talks on phone: Not on file    Gets together: Not on file    Attends religious service: Not on file    Active member of club or organization: Not on file    Attends meetings of clubs or organizations: Not on file    Relationship status: Not on file  . Intimate partner violence:    Fear of current or ex partner: Not on file    Emotionally abused: Not on file    Physically abused: Not on file    Forced sexual activity: Not on file  Other Topics Concern  . Not on file  Social History Narrative   1 son   Has living will   Wife, then son, have health care POA   Would accept resuscitation attempts but no prolonged artificial ventilation   Would not want tube feeds if cognitively unaware   Review of Systems Bowels are okay---no blood Sleeps okay at night Appetite is okay Weight stable Wears seat belt Teeth okay---does have chronic left mouth numbness.Arlean Hopping for dentist No skin lesions or chronic rash No heartburn or dysphagia No abnormal bleeding or bruising    Objective:   Physical Exam  Constitutional: He is oriented to person, place, and time. He appears well-developed. No distress.  HENT:  Mouth/Throat: Oropharynx is clear and moist. No oropharyngeal exudate.  Neck: No thyromegaly present.  Cardiovascular: Normal rate, regular rhythm and intact distal pulses. Exam reveals no gallop.  Gr 3/6 blowing systolic murmur along left sternal border  Pulmonary/Chest: Effort normal and breath sounds normal. No respiratory distress. He has no wheezes. He has no rales.  Abdominal: Soft. There is no tenderness.  Musculoskeletal: He exhibits no edema or tenderness.  Lymphadenopathy:    He has no cervical adenopathy.  Neurological: He is alert and oriented to person, place, and time.  President--- "Dwaine Deter,  Bush" [phone] [id number] Recall 3/3  Mild left side weakness  Skin: No rash noted. No erythema.  Psychiatric: He has a normal mood and affect. His behavior is normal.          Assessment & Plan:

## 2017-09-29 NOTE — Assessment & Plan Note (Signed)
Consider GI evaluation--especially if platelets down again

## 2017-09-29 NOTE — Assessment & Plan Note (Signed)
Ongoing mild left sided weakness  Uses cane  ASA and statin

## 2017-09-29 NOTE — Assessment & Plan Note (Signed)
Mild  No Rx for now 

## 2017-09-29 NOTE — Assessment & Plan Note (Signed)
Seems to be worse--- probably causing the fatigue [facility] Will consider some ritalin if not improving

## 2017-09-29 NOTE — Assessment & Plan Note (Signed)
See social history 

## 2017-09-29 NOTE — Assessment & Plan Note (Signed)
I have personally reviewed the Medicare Annual Wellness questionnaire and have noted 1. The patient's medical and social history 2. Their use of alcohol, tobacco or illicit drugs 3. Their current medications and supplements 4. The patient's functional ability including ADL's, fall risks, home safety risks and hearing or visual             impairment. 5. Diet and physical activities 6. Evidence for depression or mood disorders  The patients weight, height, BMI and visual acuity have been recorded in the chart I have made referrals, counseling and provided education to the patient based review of the above and I have provided the pt with a written personalized care plan for preventive services.  I have provided you with a copy of your personalized plan for preventive services. Please take the time to review along with your updated medication list.  Yearly flu vaccine Colon due 2022--will discuss then Discussed restarting exercise No PSA due to age

## 2017-10-01 ENCOUNTER — Other Ambulatory Visit: Payer: Self-pay

## 2017-11-07 ENCOUNTER — Other Ambulatory Visit: Payer: Self-pay | Admitting: Internal Medicine

## 2017-12-10 DIAGNOSIS — I469 Cardiac arrest, cause unspecified: Secondary | ICD-10-CM | POA: Diagnosis not present

## 2017-12-10 DIAGNOSIS — Z9889 Other specified postprocedural states: Secondary | ICD-10-CM | POA: Diagnosis not present

## 2017-12-10 DIAGNOSIS — R079 Chest pain, unspecified: Secondary | ICD-10-CM | POA: Diagnosis not present

## 2018-01-01 ENCOUNTER — Ambulatory Visit: Payer: PPO | Admitting: Internal Medicine

## 2018-01-26 ENCOUNTER — Encounter: Payer: Self-pay | Admitting: Internal Medicine

## 2018-01-26 ENCOUNTER — Ambulatory Visit (INDEPENDENT_AMBULATORY_CARE_PROVIDER_SITE_OTHER): Payer: PPO | Admitting: Internal Medicine

## 2018-01-26 VITALS — BP 110/70 | HR 83 | Temp 97.8°F | Ht 69.0 in | Wt 204.0 lb

## 2018-01-26 DIAGNOSIS — F331 Major depressive disorder, recurrent, moderate: Secondary | ICD-10-CM

## 2018-01-26 MED ORDER — METHYLPHENIDATE HCL 5 MG PO TABS: 5.0000 mg | ORAL_TABLET | Freq: Two times a day (BID) | ORAL | 0 refills | Status: DC

## 2018-01-26 NOTE — Assessment & Plan Note (Signed)
Worse than even 3 months ago Will start low dose ritalin to increase energy levels Early follow up to make sure he is improving

## 2018-01-26 NOTE — Progress Notes (Signed)
Subjective:    Patient ID: Keith Hall, male    DOB: 11-12-1945, 72 y.o.   MRN: [id number]  HPI Here for follow up of depression  He feels the depression is worse "No energy at all" [facility]  Current Outpatient Medications on File Prior to Visit  Medication Sig Dispense Refill  . acetaminophen (TYLENOL) 500 MG tablet Take 500 mg by mouth 3 (three) times daily as needed.     Marland Kitchen aspirin 325 MG EC tablet Take 1 tablet (325 mg total) by mouth daily. 90 tablet 1  . buPROPion (WELLBUTRIN XL) 300 MG 24 hr tablet TAKE 1 TABLET (300 MG TOTAL) BY MOUTH DAILY. 30 tablet 11  . citalopram (CELEXA) 20 MG tablet TAKE 1 TABLET (20 MG TOTAL) BY MOUTH DAILY. 90 tablet 3  . gabapentin (NEURONTIN) 100 MG capsule Take 200 mg by mouth at bedtime.    . Glucosamine-Chondroitin (MOVE FREE PO) Take 2 tablets by mouth daily.    . Multiple Vitamin (MULTIVITAMIN) tablet Take 1 tablet by mouth daily.     No current facility-administered medications on file prior to visit.     Allergies  Allergen Reactions  . Atorvastatin Other (See Comments)    Elevated LFTs  . Penicillins Swelling    Past Medical History:  Diagnosis Date  . BPH (benign prostatic hypertrophy)   . Depression 1980's03/29/2004   MDD---even needed hospitalization  . 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 Kidney stone 1980 only  . Osteoarthritis of both knees   . Stroke ([place])   . Sudden cardiac death [facility]) Sep 13, 1999   no clear etiology of this    Past Surgical History:  Procedure Laterality Date  . CARDIAC CATHETERIZATION  last in 13-Sep-2007   insignificant coronary disease  . CARDIOVASCULAR STRESS TEST  07/09/11   Negative--Dr Paraschos  . HERNIA REPAIR  09/13/10   left inguinal--Dr Byrnett  . ROTATOR CUFF REPAIR  09-13-2007   both done by Dr Margaretmary Eddy  . TOTAL KNEE ARTHROPLASTY Right 2/14   Dr Marry Guan  . TOTAL KNEE ARTHROPLASTY Left 1/15   Dr [facility]    Family History    Problem Relation Age of Onset  . Stroke Mother   . COPD Sister   . Diabetes Son        Type 1  . Heart disease Neg Hx   . Hypertension Neg Hx     Social History   Socioeconomic History  . Marital status: Married    Spouse name: Not on file  . Number of children: 1  . Years of education: Not on file  . Highest education level: Not on file  Occupational History  . Occupation: Outside Oncologist    Comment: Retired at [employer]  . Financial resource strain: Not on file  . Food insecurity:    Worry: Not on file    Inability: Not on file  . Transportation needs:    Medical: Not on file    Non-medical: Not on file  Tobacco Use  . Smoking status: Never Smoker  . Smokeless tobacco: Never Used  Substance and Sexual Activity  . Alcohol use: No  . Drug use: No  . Sexual activity: Not on file  Lifestyle  . Physical activity:    Days per week: Not on file    Minutes per session: Not on file  . Stress: Not on file  Relationships  . Social connections:    Talks on phone: Not on file    Gets together: Not on file    Attends religious service: Not on file    Active member of club or organization: Not on file    Attends meetings of clubs or organizations: Not on file    Relationship status: Not on file  . Intimate partner violence:    Fear of current or ex partner: Not on file    Emotionally abused: Not on file    Physically abused: Not on file    Forced sexual activity: Not on file  Other Topics Concern  . Not on file  Social History Narrative   1 son   Has living will   Wife, then son, have health care POA   Would accept resuscitation attempts but no prolonged artificial ventilation   Would not want tube feeds if cognitively unaware   Review of Systems Appetite is fine Weight is up a few pounds Sleeping "terrible"---"I can't turn my brain off"    Objective:   Physical Exam  Constitutional: No distress.  Psychiatric:  Normal speech and  appearance No overt depression           Assessment & Plan:

## 2018-01-26 NOTE — Patient Instructions (Signed)
Please start the methylphenidate with 1/2 tab (2.5mg ) with breakfast and lunch for the first 4-5 days. If you have no problems with it, increase to 1 tab twcie a day. Let me know if you have any problems.

## 2018-02-24 ENCOUNTER — Ambulatory Visit (INDEPENDENT_AMBULATORY_CARE_PROVIDER_SITE_OTHER): Payer: PPO | Admitting: Internal Medicine

## 2018-02-24 ENCOUNTER — Encounter: Payer: Self-pay | Admitting: Internal Medicine

## 2018-02-24 VITALS — BP 120/82 | HR 74 | Temp 98.0°F | Ht 69.0 in | Wt 207.0 lb

## 2018-02-24 DIAGNOSIS — F331 Major depressive disorder, recurrent, moderate: Secondary | ICD-10-CM

## 2018-02-24 DIAGNOSIS — I69359 Hemiplegia and hemiparesis following cerebral infarction affecting unspecified side: Secondary | ICD-10-CM | POA: Diagnosis not present

## 2018-02-24 DIAGNOSIS — R945 Abnormal results of liver function studies: Secondary | ICD-10-CM | POA: Diagnosis not present

## 2018-02-24 DIAGNOSIS — R7989 Other specified abnormal findings of blood chemistry: Secondary | ICD-10-CM

## 2018-02-24 LAB — LIPID PANEL
CHOLESTEROL: 193 mg/dL (ref 0–200)
HDL: 52.5 mg/dL (ref >39.00)
LDL Cholesterol: 120 mg/dL — ABNORMAL HIGH (ref 0–99)
NONHDL: 140.6
TRIGLYCERIDES: 103 mg/dL (ref 0.0–149.0)
Total CHOL/HDL Ratio: 4
VLDL: 20.6 mg/dL (ref 0.0–40.0)

## 2018-02-24 LAB — HEPATIC FUNCTION PANEL
ALBUMIN: 4.2 g/dL (ref 3.5–5.2)
ALK PHOS: 58 U/L (ref 39–117)
ALT: 83 U/L — AB (ref 0–53)
AST: 81 U/L — AB (ref 0–37)
Bilirubin, Direct: 0.1 mg/dL (ref 0.0–0.3)
TOTAL PROTEIN: 6.8 g/dL (ref 6.0–8.3)
Total Bilirubin: 0.6 mg/dL (ref 0.2–1.2)

## 2018-02-24 MED ORDER — METHYLPHENIDATE HCL 5 MG PO TABS: 5.0000 mg | ORAL_TABLET | Freq: Two times a day (BID) | ORAL | 0 refills | Status: DC

## 2018-02-24 MED ORDER — BUPROPION HCL ER (SR) 150 MG PO TB12: 150.0000 mg | ORAL_TABLET | Freq: Two times a day (BID) | ORAL | 5 refills | Status: DC

## 2018-02-24 NOTE — Assessment & Plan Note (Signed)
Will try rosuvastatin if LFT's back to normal

## 2018-02-24 NOTE — Assessment & Plan Note (Signed)
From statin Will recheck now

## 2018-02-24 NOTE — Assessment & Plan Note (Signed)
Has had chronic problems Still some anhedonia and daily (mild) symptoms Change the bupropion to bid (may help sleep) Continue ritalin for now Set up with counselor

## 2018-02-24 NOTE — Progress Notes (Signed)
Subjective:    Patient ID: Keith Hall, male    DOB: Jul 29, 1945, 72 y.o.   MRN: [id number]  HPI Here for follow up of depression Doesn't feel his mood is better--but has more energy with the methylphenidate The depression "just gets over me" Daily symptoms Not getting out much now---[facility]  Long standing severe depression Admitted in past  Did see psychiatrists in past ---Gaspar Cola No suicidal ideation  Describes hard childhood Mom remarried 5 times They moved 17 times as a child  Current Outpatient Medications on File Prior to Visit  Medication Sig Dispense Refill  . acetaminophen (TYLENOL) 500 MG tablet Take 500 mg by mouth 3 (three) times daily as needed.     Marland Kitchen aspirin 325 MG EC tablet Take 1 tablet (325 mg total) by mouth daily. 90 tablet 1  . buPROPion (WELLBUTRIN XL) 300 MG 24 hr tablet TAKE 1 TABLET (300 MG TOTAL) BY MOUTH DAILY. 30 tablet 11  . citalopram (CELEXA) 20 MG tablet TAKE 1 TABLET (20 MG TOTAL) BY MOUTH DAILY. 90 tablet 3  . gabapentin (NEURONTIN) 100 MG capsule Take 200 mg by mouth at bedtime.    . Glucosamine-Chondroitin (MOVE FREE PO) Take 2 tablets by mouth daily.    . methylphenidate (RITALIN) 5 MG tablet Take 1 tablet (5 mg total) by mouth 2 (two) times daily. With breakfast and lunch 60 tablet 0  . Multiple Vitamin (MULTIVITAMIN) tablet Take 1 tablet by mouth daily.     No current facility-administered medications on file prior to visit.     Allergies  Allergen Reactions  . Atorvastatin Other (See Comments)    Elevated LFTs  . Penicillins Swelling    Past Medical History:  Diagnosis Date  . BPH (benign prostatic hypertrophy)   . Depression 1980'sMar 28, 2004   MDD---even needed hospitalization  . 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 Kidney stone 1980 only  . Osteoarthritis of both knees   . Stroke ([place])   . Sudden cardiac death [facility]) 09/12/99   no clear etiology of  this    Past Surgical History:  Procedure Laterality Date  . CARDIAC CATHETERIZATION  last in September 12, 2007   insignificant coronary disease  . CARDIOVASCULAR STRESS TEST  07/09/11   Negative--Dr Paraschos  . HERNIA REPAIR  2010-09-12   left inguinal--Dr Byrnett  . ROTATOR CUFF REPAIR  09/12/07   both done by Dr Margaretmary Eddy  . TOTAL KNEE ARTHROPLASTY Right 2/14   Dr Marry Guan  . TOTAL KNEE ARTHROPLASTY Left 1/15   Dr [facility]    Family History  Problem Relation Age of Onset  . Stroke Mother   . COPD Sister   . Diabetes Son        Type 1  . Heart disease Neg Hx   . Hypertension Neg Hx     Social History   Socioeconomic History  . Marital status: Married    Spouse name: Not on file  . Number of children: 1  . Years of education: Not on file  . Highest education level: Not on file  Occupational History  . Occupation: Outside Oncologist    Comment: Retired at [employer]  . Financial resource strain: Not on file  . Food insecurity:    Worry: Not on file    Inability: Not on file  . Transportation needs:    Medical: Not on file  Non-medical: Not on file  Tobacco Use  . Smoking status: Never Smoker  . Smokeless tobacco: Never Used  Substance and Sexual Activity  . Alcohol use: No  . Drug use: No  . Sexual activity: Not on file  Lifestyle  . Physical activity:    Days per week: Not on file    Minutes per session: Not on file  . Stress: Not on file  Relationships  . Social connections:    Talks on phone: Not on file    Gets together: Not on file    Attends religious service: Not on file    Active member of club or organization: Not on file    Attends meetings of clubs or organizations: Not on file    Relationship status: Not on file  . Intimate partner violence:    Fear of current or ex partner: Not on file    Emotionally abused: Not on file    Physically abused: Not on file    Forced sexual activity: Not on file  Other Topics Concern  . Not on file    Social History Narrative   1 son   Has living will   Wife, then son, have health care POA   Would accept resuscitation attempts but no prolonged artificial ventilation   Would not want tube feeds if cognitively unaware   Review of Systems Not sleeping well---"terrible" (ongoing) Appetite is fine    Objective:   Physical Exam  Constitutional: He appears well-developed. No distress.  Psychiatric: He has a normal mood and affect. His behavior is normal.           Assessment & Plan:

## 2018-03-02 ENCOUNTER — Telehealth: Payer: Self-pay

## 2018-03-02 NOTE — Telephone Encounter (Signed)
PLEASE NOTE: All timestamps contained within this report are represented as [place] Standard Time. CONFIDENTIALTY NOTICE: This fax transmission is intended only for the addressee. It contains information that is legally privileged, confidential or otherwise protected from use or disclosure. If you are not the intended recipient, you are strictly prohibited from reviewing, disclosing, copying using or disseminating any of this information or taking any action in reliance on or regarding this information. If you have received this fax in error, please notify us immediately by telephone so that we can arrange for its return to [place]. Phone: [phone], Toll-Free: [phone], Fax: [phone] Page: 1 of 1 Call Id: [id number] [facility] Night - Client Nonclinical Telephone Record [place] Night - Client Client Site [facility] Physician Viviana Simpler - MD Contact Type Call Who Is Calling Patient / Member / Family / Caregiver Caller Name Keith Hall Caller Phone Number [phone] Patient Name Keith Hall Patient DOB [birth year] Call Type Message Only Information Provided Reason for Call Request to [place] Appointment Initial Comment Meyer Cory cancel appointment for the 26th of Sep. Additional Comment Call Closed By: Lawernce Keas Transaction Date/Time: 03/01/2018 8:23:38 PM (ET)

## 2018-03-02 NOTE — Telephone Encounter (Signed)
I do not see appt for 03/12/18. I spoke with Mrs Huot (DPR signed). Mrs Olivo said appt is at another doctors office and she will let pt know so pt can call correct office to cancel appt. Nothing further needed.

## 2018-03-04 ENCOUNTER — Telehealth: Payer: Self-pay | Admitting: Internal Medicine

## 2018-03-04 NOTE — Telephone Encounter (Signed)
Spoke to pt's wife per DPR. Advised her of his labs. She was asking to make sure he did not have Fatty Liver. I advised her that he had Elevated LFts not Fatty Liver. His last [place] in April 2018 states his liver is normal in appearance.

## 2018-03-04 NOTE — Telephone Encounter (Signed)
Copied from [place] [phone]. Topic: Quick Communication - See Telephone Encounter >> Mar 04, 2018 12:10 PM Bea Graff, NT wrote: CRM for notification. See Telephone encounter for: 03/04/18. Pt requesting a call back with his lab results.

## 2018-03-12 ENCOUNTER — Ambulatory Visit: Payer: PPO | Admitting: Psychology

## 2018-03-18 ENCOUNTER — Other Ambulatory Visit: Payer: Self-pay | Admitting: Internal Medicine

## 2018-03-19 ENCOUNTER — Ambulatory Visit (INDEPENDENT_AMBULATORY_CARE_PROVIDER_SITE_OTHER): Payer: PPO

## 2018-03-19 DIAGNOSIS — Z23 Encounter for immunization: Secondary | ICD-10-CM

## 2018-04-28 ENCOUNTER — Encounter: Payer: Self-pay | Admitting: Internal Medicine

## 2018-04-28 ENCOUNTER — Ambulatory Visit (INDEPENDENT_AMBULATORY_CARE_PROVIDER_SITE_OTHER): Payer: PPO | Admitting: Internal Medicine

## 2018-04-28 VITALS — BP 120/84 | HR 94 | Temp 97.4°F | Ht 69.0 in | Wt 204.0 lb

## 2018-04-28 DIAGNOSIS — F331 Major depressive disorder, recurrent, moderate: Secondary | ICD-10-CM | POA: Diagnosis not present

## 2018-04-28 MED ORDER — METHYLPHENIDATE HCL 5 MG PO TABS: 5.0000 mg | ORAL_TABLET | Freq: Two times a day (BID) | ORAL | 0 refills | Status: DC

## 2018-04-28 NOTE — Progress Notes (Signed)
Subjective:    Patient ID: Keith Hall, male    DOB: 12-Jun-1946, 72 y.o.   MRN: [id number]  HPI Here for follow up of depression  Never went for the counseling-- but wife did Did feel the methylphenidate helped --especially with focus--but he picked up the second prescription and was scared by the information about it (so he didn't continue on it)  [facility] Still sitting at home and not going out Some regular sadness--but not as bad Not thinking about dying or suicide  Current Outpatient Medications on File Prior to Visit  Medication Sig Dispense Refill  . acetaminophen (TYLENOL) 500 MG tablet Take 500 mg by mouth 3 (three) times daily as needed.     Marland Kitchen aspirin 325 MG EC tablet Take 1 tablet (325 mg total) by mouth daily. 90 tablet 1  . buPROPion (WELLBUTRIN SR) 150 MG 12 hr tablet TAKE 1 TABLET (150 MG TOTAL) BY MOUTH 2 (TWO) TIMES DAILY EVERY MORNING/AFTER LUNCH 180 tablet 3  . citalopram (CELEXA) 20 MG tablet TAKE 1 TABLET (20 MG TOTAL) BY MOUTH DAILY. 90 tablet 3  . gabapentin (NEURONTIN) 100 MG capsule Take 200 mg by mouth at bedtime.    . Glucosamine-Chondroitin (MOVE FREE PO) Take 2 tablets by mouth daily.    . Multiple Vitamin (MULTIVITAMIN) tablet Take 1 tablet by mouth daily.    . methylphenidate (RITALIN) 5 MG tablet Take 1 tablet (5 mg total) by mouth 2 (two) times daily. With breakfast and lunch (Patient not taking: Reported on 04/28/2018) 60 tablet 0   No current facility-administered medications on file prior to visit.     Allergies  Allergen Reactions  . Atorvastatin Other (See Comments)    Elevated LFTs  . Penicillins Swelling    Past Medical History:  Diagnosis Date  . BPH (benign prostatic hypertrophy)   . Depression 1980's2004-04-12   MDD---even needed hospitalization  . 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 Kidney stone 1980 only  . Osteoarthritis of both knees   . Stroke  ([place])   . Sudden cardiac death [facility]) 09-27-1999   no clear etiology of this    Past Surgical History:  Procedure Laterality Date  . CARDIAC CATHETERIZATION  last in 27-Sep-2007   insignificant coronary disease  . CARDIOVASCULAR STRESS TEST  07/09/11   Negative--Dr Paraschos  . HERNIA REPAIR  September 27, 2010   left inguinal--Dr Byrnett  . ROTATOR CUFF REPAIR  09/27/2007   both done by Dr Margaretmary Eddy  . TOTAL KNEE ARTHROPLASTY Right 2/14   Dr Marry Guan  . TOTAL KNEE ARTHROPLASTY Left 1/15   Dr [facility]    Family History  Problem Relation Age of Onset  . Stroke Mother   . COPD Sister   . Diabetes Son        Type 1  . Heart disease Neg Hx   . Hypertension Neg Hx     Social History   Socioeconomic History  . Marital status: Married    Spouse name: Not on file  . Number of children: 1  . Years of education: Not on file  . Highest education level: Not on file  Occupational History  . Occupation: Outside Oncologist    Comment: Retired at [employer]  . Financial resource strain: Not on file  . Food insecurity:    Worry: Not on file    Inability: Not on  file  . Transportation needs:    Medical: Not on file    Non-medical: Not on file  Tobacco Use  . Smoking status: Never Smoker  . Smokeless tobacco: Never Used  Substance and Sexual Activity  . Alcohol use: No  . Drug use: No  . Sexual activity: Not on file  Lifestyle  . Physical activity:    Days per week: Not on file    Minutes per session: Not on file  . Stress: Not on file  Relationships  . Social connections:    Talks on phone: Not on file    Gets together: Not on file    Attends religious service: Not on file    Active member of club or organization: Not on file    Attends meetings of clubs or organizations: Not on file    Relationship status: Not on file  . Intimate partner violence:    Fear of current or ex partner: Not on file    Emotionally abused: Not on file    Physically abused: Not on file    Forced sexual  activity: Not on file  Other Topics Concern  . Not on file  Social History Narrative   1 son   Has living will   Wife, then son, have health care POA   Would accept resuscitation attempts but no prolonged artificial ventilation   Would not want tube feeds if cognitively unaware   Review of Systems May be sleeping some better with the bupropion change Appetite is great--- "I am addicted to food" Weight is about the same    Objective:   Physical Exam  Constitutional: No distress.  Psychiatric:  Normal appearance and conversation Not overtly depression           Assessment & Plan:

## 2018-04-28 NOTE — Assessment & Plan Note (Signed)
Mood is some better but still staying at home Didn't go for counseling Not back to the gym Had positive response to methylphenidate--but then stopped it. Asked him to restart it Continue the other medications

## 2018-04-28 NOTE — Patient Instructions (Signed)
Please restart the methylphenidate 5 mg twice a day. This is a low dose and should be safe for you.

## 2018-06-01 DIAGNOSIS — E782 Mixed hyperlipidemia: Secondary | ICD-10-CM | POA: Diagnosis not present

## 2018-06-01 DIAGNOSIS — R079 Chest pain, unspecified: Secondary | ICD-10-CM | POA: Diagnosis not present

## 2018-06-01 DIAGNOSIS — I469 Cardiac arrest, cause unspecified: Secondary | ICD-10-CM | POA: Diagnosis not present

## 2018-06-01 DIAGNOSIS — Z9889 Other specified postprocedural states: Secondary | ICD-10-CM | POA: Diagnosis not present

## 2018-06-01 DIAGNOSIS — Z8679 Personal history of other diseases of the circulatory system: Secondary | ICD-10-CM | POA: Diagnosis not present

## 2018-07-15 ENCOUNTER — Telehealth: Payer: Self-pay | Admitting: *Deleted

## 2018-07-15 MED ORDER — PREDNISONE 20 MG PO TABS: 40.0000 mg | ORAL_TABLET | Freq: Every day | ORAL | 0 refills | Status: DC

## 2018-07-15 NOTE — Telephone Encounter (Signed)
Let them know I sent the prescription If he isn't improving by next week, will need evaluation

## 2018-07-15 NOTE — Telephone Encounter (Signed)
Spoke to  pt's wife .

## 2018-07-15 NOTE — Telephone Encounter (Signed)
Patient's wife left a voicemail requesting a script for Prednisone. Mrs.Schowalter stated that he is a stroke victim and he has his back to go out on him. Mrs. Deloatch stated that his back went out on him this morning and he can barely move. Mrs. Matters stated that he last took Prednisone 20 mg #10 pills that helped it. [facility]

## 2018-07-22 ENCOUNTER — Emergency Department: Payer: PPO

## 2018-07-22 ENCOUNTER — Other Ambulatory Visit: Payer: Self-pay

## 2018-07-22 ENCOUNTER — Emergency Department
Admission: EM | Admit: 2018-07-22 | Discharge: 2018-07-22 | Disposition: A | Discharge status: Home / Self Care | Payer: PPO | Attending: Emergency Medicine | Admitting: Emergency Medicine

## 2018-07-22 ENCOUNTER — Encounter: Payer: Self-pay | Admitting: Emergency Medicine

## 2018-07-22 DIAGNOSIS — Y998 Other external cause status: Secondary | ICD-10-CM | POA: Diagnosis not present

## 2018-07-22 DIAGNOSIS — R42 Dizziness and giddiness: Secondary | ICD-10-CM | POA: Insufficient documentation

## 2018-07-22 DIAGNOSIS — R52 Pain, unspecified: Secondary | ICD-10-CM | POA: Diagnosis not present

## 2018-07-22 DIAGNOSIS — G8190 Hemiplegia, unspecified affecting unspecified side: Secondary | ICD-10-CM | POA: Diagnosis not present

## 2018-07-22 DIAGNOSIS — R0602 Shortness of breath: Secondary | ICD-10-CM | POA: Diagnosis not present

## 2018-07-22 DIAGNOSIS — M25519 Pain in unspecified shoulder: Secondary | ICD-10-CM | POA: Diagnosis not present

## 2018-07-22 DIAGNOSIS — Y9389 Activity, other specified: Secondary | ICD-10-CM | POA: Insufficient documentation

## 2018-07-22 DIAGNOSIS — S2232XA Fracture of one rib, left side, initial encounter for closed fracture: Secondary | ICD-10-CM | POA: Diagnosis not present

## 2018-07-22 DIAGNOSIS — Y9289 Other specified places as the place of occurrence of the external cause: Secondary | ICD-10-CM | POA: Diagnosis not present

## 2018-07-22 DIAGNOSIS — W19XXXA Unspecified fall, initial encounter: Secondary | ICD-10-CM

## 2018-07-22 DIAGNOSIS — W010XXA Fall on same level from slipping, tripping and stumbling without subsequent striking against object, initial encounter: Secondary | ICD-10-CM | POA: Insufficient documentation

## 2018-07-22 DIAGNOSIS — M25512 Pain in left shoulder: Secondary | ICD-10-CM | POA: Diagnosis not present

## 2018-07-22 DIAGNOSIS — R531 Weakness: Secondary | ICD-10-CM | POA: Diagnosis not present

## 2018-07-22 DIAGNOSIS — I2510 Atherosclerotic heart disease of native coronary artery without angina pectoris: Secondary | ICD-10-CM | POA: Insufficient documentation

## 2018-07-22 DIAGNOSIS — R296 Repeated falls: Secondary | ICD-10-CM | POA: Diagnosis not present

## 2018-07-22 DIAGNOSIS — I4510 Unspecified right bundle-branch block: Secondary | ICD-10-CM | POA: Diagnosis not present

## 2018-07-22 DIAGNOSIS — S4992XA Unspecified injury of left shoulder and upper arm, initial encounter: Secondary | ICD-10-CM | POA: Diagnosis not present

## 2018-07-22 LAB — CBC WITH DIFFERENTIAL/PLATELET
Abs Immature Granulocytes: 0.02 10*3/uL (ref 0.00–0.07)
Basophils Absolute: 0 10*3/uL (ref 0.0–0.1)
Basophils Relative: 1 %
EOS ABS: 0.1 10*3/uL (ref 0.0–0.5)
Eosinophils Relative: 2 %
HCT: 41.4 % (ref 39.0–52.0)
Hemoglobin: 14.3 g/dL (ref 13.0–17.0)
Immature Granulocytes: 0 %
Lymphocytes Relative: 11 %
Lymphs Abs: 0.7 10*3/uL (ref 0.7–4.0)
MCH: 30.9 pg (ref 26.0–34.0)
MCHC: 34.5 g/dL (ref 30.0–36.0)
MCV: 89.4 fL (ref 80.0–100.0)
Monocytes Absolute: 0.7 10*3/uL (ref 0.1–1.0)
Monocytes Relative: 11 %
NEUTROS PCT: 75 %
Neutro Abs: 5.1 10*3/uL (ref 1.7–7.7)
Platelets: 133 10*3/uL — ABNORMAL LOW (ref 150–400)
RBC: 4.63 MIL/uL (ref 4.22–5.81)
RDW: 12.7 % (ref 11.5–15.5)
WBC: 6.7 10*3/uL (ref 4.0–10.5)
nRBC: 0 % (ref 0.0–0.2)

## 2018-07-22 LAB — COMPREHENSIVE METABOLIC PANEL
ALT: 46 U/L — ABNORMAL HIGH (ref 0–44)
AST: 56 U/L — ABNORMAL HIGH (ref 15–41)
Albumin: 4.2 g/dL (ref 3.5–5.0)
Alkaline Phosphatase: 53 U/L (ref 38–126)
Anion gap: 6 (ref 5–15)
BILIRUBIN TOTAL: 1 mg/dL (ref 0.3–1.2)
BUN: 17 mg/dL (ref 8–23)
CO2: 25 mmol/L (ref 22–32)
Calcium: 8.9 mg/dL (ref 8.9–10.3)
Chloride: 107 mmol/L (ref 98–111)
Creatinine, Ser: 0.78 mg/dL (ref 0.61–1.24)
GFR calc Af Amer: >60 mL/min (ref >60)
Glucose, Bld: 95 mg/dL (ref 70–99)
Potassium: 4.1 mmol/L (ref 3.5–5.1)
Sodium: 138 mmol/L (ref 135–145)
Total Protein: 7 g/dL (ref 6.5–8.1)

## 2018-07-22 LAB — TROPONIN I

## 2018-07-22 LAB — CK: Total CK: 874 U/L — ABNORMAL HIGH (ref 49–397)

## 2018-07-22 MED ORDER — SODIUM CHLORIDE 0.9 % IV SOLN
Freq: Once | INTRAVENOUS | Status: AC
  Administered 2018-07-22: 1000 mL via INTRAVENOUS

## 2018-07-22 MED ORDER — OXYCODONE-ACETAMINOPHEN 5-325 MG PO TABS: 1.0000 | ORAL_TABLET | Freq: Three times a day (TID) | ORAL | 0 refills | Status: DC | PRN

## 2018-07-22 MED ORDER — ACETAMINOPHEN 500 MG PO TABS
1000.0000 mg | ORAL_TABLET | Freq: Once | ORAL | Status: AC
  Administered 2018-07-22: 1000 mg via ORAL
  Filled 2018-07-22: qty 2

## 2018-07-22 NOTE — ED Triage Notes (Addendum)
Pt arrived after mechanical fall today and last night. Pt has a HX of stroke with left sided weakness. Pt states "I didn't feel right last night." PT arrives a&o x 4 with complaints of left leg pain/tingling. Pt denies hitting his head or any loc.

## 2018-07-22 NOTE — ED Provider Notes (Addendum)
[facility] Emergency Department Provider Note       Time seen: ----------------------------------------- 10:20 AM on 07/22/2018 -----------------------------------------   I have reviewed the triage vital signs and the nursing notes.  HISTORY   Chief Complaint Fall    HPI Keith Hall is a 73 y.o. 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  Patient has a history of previously mentioned hemorrhage with left-sided residual weakness which he states has improved some.  He reports he did not feel well last night, has had dizziness and also some left side and shoulder pain since his fall.  He denies hitting his head or having loss of consciousness.  Past Medical History:  Diagnosis Date  . BPH (benign prostatic hypertrophy)   . Depression 1980's04/19/04   MDD---even needed hospitalization  . Intraparenchymal hemorrhage of brain ([place]) 12/15   Right basal ganglia--Rx at [facility] and rehab  . Kidney stone 1980 only  . Osteoarthritis of both knees   . Stroke ([place])   . Sudden cardiac death [facility]) 10-04-1999   no clear etiology of this    Patient Active Problem List   Diagnosis Date Noted  . Carotid artery disease ([place]) 03/31/2017  . Low back pain 03/13/2016  . Elevated LFTs 07/04/2015  . 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 Depression due to stroke 07/27/2014  . Advanced directives, counseling/discussion 02/07/2014  . Routine general medical examination at a health care facility 01/29/2013  . Osteoarthritis of both knees   . MDD (major depressive disorder), recurrent episode, moderate ([place])   . BPH (benign prostatic hyperplasia)     Past Surgical History:  Procedure Laterality Date  . CARDIAC CATHETERIZATION  last in October 04, 2007   insignificant coronary disease  .  CARDIOVASCULAR STRESS TEST  07/09/11   Negative--Dr Paraschos  . HERNIA REPAIR  04-Oct-2010   left inguinal--Dr Byrnett  . ROTATOR CUFF REPAIR  10-04-07   both done by Dr Margaretmary Eddy  . TOTAL KNEE ARTHROPLASTY Right 2/14   Dr Marry Guan  . TOTAL KNEE ARTHROPLASTY Left 1/15   Dr [facility]    Allergies Atorvastatin and Penicillins  Social History Social History   Tobacco Use  . Smoking status: Never Smoker  . Smokeless tobacco: Never Used  Substance Use Topics  . Alcohol use: No  . Drug use: No   Review of Systems Constitutional: Negative for fever. Cardiovascular: Negative for chest pain. Respiratory: Negative for shortness of breath. Gastrointestinal: Negative for abdominal pain, vomiting and diarrhea. Musculoskeletal: Positive for left shoulder and left rib pain Skin: Negative for rash. Neurological: Negative for headaches, positive for dizziness and weakness  All systems negative/normal/unremarkable except as stated in the HPI  ____________________________________________   PHYSICAL EXAM:  VITAL SIGNS: ED Triage Vitals [07/22/18 1006]  Enc Vitals Group     BP      Pulse      Resp      Temp      Temp src      SpO2      Weight 188 lb (85.3 kg)     Height 5\' 10"  (1.778 m)     Head Circumference      Peak Flow      Pain Score 4     Pain Loc      Pain Edu?  Excl. in [place]?    Constitutional: Alert and oriented. Well appearing and in no distress. Eyes: Conjunctivae are normal. Normal extraocular movements. ENT      Head: Normocephalic and atraumatic.      Nose: No congestion/rhinnorhea.      Mouth/Throat: Mucous membranes are moist.      Neck: No stridor. Cardiovascular: Normal rate, regular rhythm. No murmurs, rubs, or gallops. Respiratory: Normal respiratory effort without tachypnea nor retractions. Breath sounds are clear and equal bilaterally. No wheezes/rales/rhonchi. Gastrointestinal: Soft and nontender. Normal bowel sounds Musculoskeletal: Nontender with  normal range of motion in extremities. No lower extremity tenderness nor edema. Neurologic:  Normal speech and language.  Left arm and leg weakness compared to right.  Cranial nerves appear to be normal. Skin:  Skin is warm, dry and intact. No rash noted. Psychiatric: Mood and affect are normal. Speech and behavior are normal.  ____________________________________________  EKG: Interpreted by me.  Sinus rhythm the rate of 66 bpm, incomplete right bundle branch block, left anterior fascicular block, normal QT  ____________________________________________  ED COURSE:  As part of my medical decision making, I reviewed the following data within the [place] History obtained from family if available, nursing notes, old chart and ekg, as well as notes from prior ED visits. Patient presented for dizziness and weakness with 2 falls, we will assess with labs and imaging as indicated at this time.   Procedures ____________________________________________   LABS (pertinent positives/negatives)  Labs Reviewed  CBC WITH DIFFERENTIAL/PLATELET - Abnormal; Notable for the following components:      Result Value   Platelets 133 (*)    All other components within normal limits  COMPREHENSIVE METABOLIC PANEL - Abnormal; Notable for the following components:   AST 56 (*)    ALT 46 (*)    All other components within normal limits  CK - Abnormal; Notable for the following components:   Total CK 874 (*)    All other components within normal limits  TROPONIN I  URINALYSIS, COMPLETE (UACMP) WITH MICROSCOPIC    RADIOLOGY Images were viewed by me  CT head, shoulder x-ray, left rib x-rays. IMPRESSION: Mild chronic ischemic white matter disease. No acute intracranial abnormality seen. IMPRESSION: Nondisplaced fracture of the anterior left eighth rib. ____________________________________________   DIFFERENTIAL DIAGNOSIS   CVA, dehydration, electrolyte abnormality, occult  infection, hemorrhage  FINAL ASSESSMENT AND PLAN  Dizziness, fall, left rib fracture   Plan: The patient had presented for dizziness with 2 recent falls. Patient's labs did indicate a mildly elevated CK level for which she was given a liter of saline. Patient's imaging not reveal any acute process.  Family feels confident this was a mechanical fall related.  He still describes some dizziness of uncertain etiology.  Pain medicine was given for the isolated left rib fracture.  Overall he appears cleared for outpatient follow-up.   Laurence Aly, MD    Note: This note was generated in part or whole with voice recognition software. Voice recognition is usually quite accurate but there are transcription errors that can and very often do occur. I apologize for any typographical errors that were not detected and corrected.     Earleen Newport, MD 07/22/18 1315    Earleen Newport, MD 07/22/18 [phone]

## 2018-07-22 NOTE — ED Notes (Addendum)
Pt ambulated to toilet with one assist, steady gait with some left side weakness at baseline.  Pt does report dizziness upon sitting and standing but does not appear off balance.  EDP notified.

## 2018-07-23 ENCOUNTER — Telehealth: Payer: Self-pay

## 2018-07-23 NOTE — Telephone Encounter (Signed)
Called pt's home number but there was no answer.

## 2018-07-24 ENCOUNTER — Telehealth: Payer: Self-pay

## 2018-07-24 MED ORDER — METHYLPHENIDATE HCL 5 MG PO TABS: 5.0000 mg | ORAL_TABLET | Freq: Two times a day (BID) | ORAL | 0 refills | Status: DC

## 2018-07-24 NOTE — Telephone Encounter (Signed)
Pt's wife called to give an update on the pt. She said the pt fell about 5 times. He has a lot of bruising. He had x-rays on his shoulders and chest. He did fracture a rib. She states he is using a walker. They have him on oxycodone.

## 2018-07-24 NOTE — Telephone Encounter (Signed)
Name of Medication: Methylphenidate Name of [facility]acy: [facility] Last Keith Hall or Written Date and Quantity: 04/28/18, #30 Last Office Visit and Type: 04/28/18, f/u Next Office Visit and Type: 07/30/18, 3 mo f/u Last Controlled Substance Agreement Date: none Last UDS: none

## 2018-07-30 ENCOUNTER — Ambulatory Visit: Payer: Self-pay | Admitting: Internal Medicine

## 2018-08-05 NOTE — Telephone Encounter (Signed)
Keith Hall [facility] signed) said that she had spoken with [facility] and that the pharmacy is waiting to fill the methylphenidate until doctor receives approval from insurance co. Keith Hall request cb.

## 2018-08-06 NOTE — Telephone Encounter (Signed)
Spoke to pt's wife. She will try to find his Part D #. I had gone on CoverMyMeds and the pharmacy had put in the wrong ID number so I could not process it. I advised her there was a possibility that insurance will not cover the methylphenidate for depression. She will get the number and let me know.

## 2018-08-07 NOTE — Telephone Encounter (Signed)
Did PA over the phone. Waiting for response by fax. Advised wife. We discussed GoodRx in case the insurance will mot cover the medication.

## 2018-08-07 NOTE — Telephone Encounter (Signed)
pts wife left v/m that Keith Hall has message on mychart from Keith Hall; pts husband needs this medication today. If need to talk with Keith Hall please give her a call at home.

## 2018-09-14 ENCOUNTER — Ambulatory Visit (INDEPENDENT_AMBULATORY_CARE_PROVIDER_SITE_OTHER): Payer: PPO | Admitting: Internal Medicine

## 2018-09-14 ENCOUNTER — Encounter: Payer: Self-pay | Admitting: Internal Medicine

## 2018-09-14 ENCOUNTER — Other Ambulatory Visit: Payer: Self-pay

## 2018-09-14 DIAGNOSIS — I69359 Hemiplegia and hemiparesis following cerebral infarction affecting unspecified side: Secondary | ICD-10-CM | POA: Diagnosis not present

## 2018-09-14 NOTE — Assessment & Plan Note (Signed)
Ongoing left side weakness and now more troubles with the strength in the right He has no evidence of a new stroke Will set up with PT-----she can't get him into [facility] but could get him into [facility] for their PT. Will make referral and see when they can start (given the [place])

## 2018-09-14 NOTE — Progress Notes (Signed)
Subjective:    Patient ID: Keith Hall, male    DOB: 1945/11/14, 73 y.o.   MRN: [id number]  HPI Wife June and patient are in their home I am in my office Visit due to frequent falls Video visit with FaceTime Reviewed billing, identifiers  Broke rib in February Feels he is worse than since the stroke Has fallen ~5 more times Balance is very bad Will fall just walking Right side seems weaker now----left side still affected by stroke  Current Outpatient Medications on File Prior to Visit  Medication Sig Dispense Refill  . acetaminophen (TYLENOL) 500 MG tablet Take 500 mg by mouth 3 (three) times daily as needed.     Marland Kitchen aspirin 81 MG chewable tablet Chew 81 mg by mouth daily.    Marland Kitchen buPROPion (WELLBUTRIN SR) 150 MG 12 hr tablet TAKE 1 TABLET (150 MG TOTAL) BY MOUTH 2 (TWO) TIMES DAILY EVERY MORNING/AFTER LUNCH 180 tablet 3  . citalopram (CELEXA) 20 MG tablet TAKE 1 TABLET (20 MG TOTAL) BY MOUTH DAILY. 90 tablet 3  . clonazePAM (KLONOPIN) 0.5 MG tablet Take 0.5 mg by mouth daily.    . Glucosamine-Chondroitin (MOVE FREE PO) Take 2 tablets by mouth daily.    . methylphenidate (RITALIN) 5 MG tablet Take 1 tablet (5 mg total) by mouth 2 (two) times daily. With breakfast and lunch 60 tablet 0  . Multiple Vitamin (MULTIVITAMIN) tablet Take 2 tablets by mouth daily.      No current facility-administered medications on file prior to visit.     Allergies  Allergen Reactions  . Atorvastatin Other (See Comments)    Elevated LFTs  . Penicillins Hives, Swelling and Rash    Did it involve swelling of the face/tongue/throat, SOB, or low BP? Yes Did it involve sudden or severe rash/hives, skin peeling, or any reaction on the inside of your mouth or nose? Yes Did you need to seek medical attention at a hospital or doctor's office? Yes When did it last happen?15 years If all above answers are "NO", may proceed with cephalosporin use.    Past Medical History:  Diagnosis Date  . BPH  (benign prostatic hypertrophy)   . Depression 1980's04-13-04   MDD---even needed hospitalization  . Intraparenchymal hemorrhage of brain ([place]) 12/15   Right basal ganglia--Rx at [facility] and rehab  . Kidney stone 1980 only  . Osteoarthritis of both knees   . Stroke ([place])   . Sudden cardiac death [facility]) September 28, 1999   no clear etiology of this    Past Surgical History:  Procedure Laterality Date  . CARDIAC CATHETERIZATION  last in 2007-09-28   insignificant coronary disease  . CARDIOVASCULAR STRESS TEST  07/09/11   Negative--Dr Paraschos  . HERNIA REPAIR  2010/09/28   left inguinal--Dr Byrnett  . ROTATOR CUFF REPAIR  September 28, 2007   both done by Dr Margaretmary Eddy  . TOTAL KNEE ARTHROPLASTY Right 2/14   Dr Marry Guan  . TOTAL KNEE ARTHROPLASTY Left 1/15   Dr [facility]    Family History  Problem Relation Age of Onset  . Stroke Mother   . COPD Sister   . Diabetes Son        Type 1  . Heart disease Neg Hx   . Hypertension Neg Hx     Social History   Socioeconomic History  . Marital status: Married    Spouse name: Not on file  . Number of children: 1  . Years of education: Not on file  . Highest  education level: Not on file  Occupational History  . Occupation: Outside Oncologist    Comment: Retired at [employer]  . Financial resource strain: Not on file  . Food insecurity:    Worry: Not on file    Inability: Not on file  . Transportation needs:    Medical: Not on file    Non-medical: Not on file  Tobacco Use  . Smoking status: Never Smoker  . Smokeless tobacco: Never Used  Substance and Sexual Activity  . Alcohol use: No  . Drug use: No  . Sexual activity: Not on file  Lifestyle  . Physical activity:    Days per week: Not on file    Minutes per session: Not on file  . Stress: Not on file  Relationships  . Social connections:    Talks on phone: Not on file    Gets together: Not on file    Attends religious service: Not on file    Active member of club or organization: Not on  file    Attends meetings of clubs or organizations: Not on file    Relationship status: Not on file  . Intimate partner violence:    Fear of current or ex partner: Not on file    Emotionally abused: Not on file    Physically abused: Not on file    Forced sexual activity: Not on file  Other Topics Concern  . Not on file  Social History Narrative   1 son   Has living will   Wife, then son, have health care POA   Would accept resuscitation attempts but no prolonged artificial ventilation   Would not want tube feeds if cognitively unaware   Review of Systems No illness No fever or cough Appetite is fine No weight loss Never sleeps well---up and down in the night (nothing new). Then sleeps till nooon    Objective:   Physical Exam  Constitutional: He appears well-developed. No distress.  Musculoskeletal:        General: No edema.  Neurological:  Great difficulty pushing up off couch (left arm plegic) Favors left side still when walking--stiff legged Right leg gives way some with gait (using cane)           Assessment & Plan:

## 2018-09-23 DIAGNOSIS — E113293 Type 2 diabetes mellitus with mild nonproliferative diabetic retinopathy without macular edema, bilateral: Secondary | ICD-10-CM | POA: Diagnosis not present

## 2018-09-23 DIAGNOSIS — R2681 Unsteadiness on feet: Secondary | ICD-10-CM | POA: Diagnosis not present

## 2018-09-23 DIAGNOSIS — R262 Difficulty in walking, not elsewhere classified: Secondary | ICD-10-CM | POA: Diagnosis not present

## 2018-09-23 DIAGNOSIS — R296 Repeated falls: Secondary | ICD-10-CM | POA: Diagnosis not present

## 2018-09-23 DIAGNOSIS — R05 Cough: Secondary | ICD-10-CM | POA: Diagnosis not present

## 2018-09-23 DIAGNOSIS — H2513 Age-related nuclear cataract, bilateral: Secondary | ICD-10-CM | POA: Diagnosis not present

## 2018-09-29 DIAGNOSIS — R262 Difficulty in walking, not elsewhere classified: Secondary | ICD-10-CM | POA: Diagnosis not present

## 2018-09-29 DIAGNOSIS — R2681 Unsteadiness on feet: Secondary | ICD-10-CM | POA: Diagnosis not present

## 2018-09-29 DIAGNOSIS — I739 Peripheral vascular disease, unspecified: Secondary | ICD-10-CM | POA: Diagnosis not present

## 2018-09-29 DIAGNOSIS — I509 Heart failure, unspecified: Secondary | ICD-10-CM | POA: Diagnosis not present

## 2018-09-29 DIAGNOSIS — J449 Chronic obstructive pulmonary disease, unspecified: Secondary | ICD-10-CM | POA: Diagnosis not present

## 2018-09-29 DIAGNOSIS — D649 Anemia, unspecified: Secondary | ICD-10-CM | POA: Diagnosis not present

## 2018-09-29 DIAGNOSIS — R296 Repeated falls: Secondary | ICD-10-CM | POA: Diagnosis not present

## 2018-10-01 DIAGNOSIS — R296 Repeated falls: Secondary | ICD-10-CM | POA: Diagnosis not present

## 2018-10-01 DIAGNOSIS — R2681 Unsteadiness on feet: Secondary | ICD-10-CM | POA: Diagnosis not present

## 2018-10-01 DIAGNOSIS — R262 Difficulty in walking, not elsewhere classified: Secondary | ICD-10-CM | POA: Diagnosis not present

## 2018-10-06 DIAGNOSIS — R262 Difficulty in walking, not elsewhere classified: Secondary | ICD-10-CM | POA: Diagnosis not present

## 2018-10-06 DIAGNOSIS — R2681 Unsteadiness on feet: Secondary | ICD-10-CM | POA: Diagnosis not present

## 2018-10-06 DIAGNOSIS — R296 Repeated falls: Secondary | ICD-10-CM | POA: Diagnosis not present

## 2018-10-08 DIAGNOSIS — R296 Repeated falls: Secondary | ICD-10-CM | POA: Diagnosis not present

## 2018-10-08 DIAGNOSIS — R2681 Unsteadiness on feet: Secondary | ICD-10-CM | POA: Diagnosis not present

## 2018-10-08 DIAGNOSIS — R262 Difficulty in walking, not elsewhere classified: Secondary | ICD-10-CM | POA: Diagnosis not present

## 2018-10-13 DIAGNOSIS — R262 Difficulty in walking, not elsewhere classified: Secondary | ICD-10-CM | POA: Diagnosis not present

## 2018-10-13 DIAGNOSIS — R2681 Unsteadiness on feet: Secondary | ICD-10-CM | POA: Diagnosis not present

## 2018-10-13 DIAGNOSIS — R296 Repeated falls: Secondary | ICD-10-CM | POA: Diagnosis not present

## 2018-10-15 DIAGNOSIS — R2681 Unsteadiness on feet: Secondary | ICD-10-CM | POA: Diagnosis not present

## 2018-10-15 DIAGNOSIS — R296 Repeated falls: Secondary | ICD-10-CM | POA: Diagnosis not present

## 2018-10-15 DIAGNOSIS — R262 Difficulty in walking, not elsewhere classified: Secondary | ICD-10-CM | POA: Diagnosis not present

## 2018-10-22 DIAGNOSIS — R2681 Unsteadiness on feet: Secondary | ICD-10-CM | POA: Diagnosis not present

## 2018-10-22 DIAGNOSIS — R296 Repeated falls: Secondary | ICD-10-CM | POA: Diagnosis not present

## 2018-10-22 DIAGNOSIS — R262 Difficulty in walking, not elsewhere classified: Secondary | ICD-10-CM | POA: Diagnosis not present

## 2018-10-27 DIAGNOSIS — R296 Repeated falls: Secondary | ICD-10-CM | POA: Diagnosis not present

## 2018-10-27 DIAGNOSIS — R262 Difficulty in walking, not elsewhere classified: Secondary | ICD-10-CM | POA: Diagnosis not present

## 2018-10-27 DIAGNOSIS — R2681 Unsteadiness on feet: Secondary | ICD-10-CM | POA: Diagnosis not present

## 2018-10-29 DIAGNOSIS — R262 Difficulty in walking, not elsewhere classified: Secondary | ICD-10-CM | POA: Diagnosis not present

## 2018-10-29 DIAGNOSIS — R296 Repeated falls: Secondary | ICD-10-CM | POA: Diagnosis not present

## 2018-10-29 DIAGNOSIS — R2681 Unsteadiness on feet: Secondary | ICD-10-CM | POA: Diagnosis not present

## 2018-11-04 DIAGNOSIS — R262 Difficulty in walking, not elsewhere classified: Secondary | ICD-10-CM | POA: Diagnosis not present

## 2018-11-04 DIAGNOSIS — R296 Repeated falls: Secondary | ICD-10-CM | POA: Diagnosis not present

## 2018-11-04 DIAGNOSIS — R2681 Unsteadiness on feet: Secondary | ICD-10-CM | POA: Diagnosis not present

## 2018-11-06 DIAGNOSIS — R2681 Unsteadiness on feet: Secondary | ICD-10-CM | POA: Diagnosis not present

## 2018-11-06 DIAGNOSIS — R296 Repeated falls: Secondary | ICD-10-CM | POA: Diagnosis not present

## 2018-11-06 DIAGNOSIS — R262 Difficulty in walking, not elsewhere classified: Secondary | ICD-10-CM | POA: Diagnosis not present

## 2018-11-10 DIAGNOSIS — R2681 Unsteadiness on feet: Secondary | ICD-10-CM | POA: Diagnosis not present

## 2018-11-10 DIAGNOSIS — R296 Repeated falls: Secondary | ICD-10-CM | POA: Diagnosis not present

## 2018-11-10 DIAGNOSIS — R262 Difficulty in walking, not elsewhere classified: Secondary | ICD-10-CM | POA: Diagnosis not present

## 2018-11-11 ENCOUNTER — Telehealth: Payer: Self-pay | Admitting: Internal Medicine

## 2018-11-11 MED ORDER — PREDNISONE 20 MG PO TABS: 40.0000 mg | ORAL_TABLET | Freq: Every day | ORAL | 0 refills | Status: DC

## 2018-11-11 NOTE — Telephone Encounter (Signed)
Left message asking pt to call office please let pt know dr Everardo Beals comments

## 2018-11-11 NOTE — Telephone Encounter (Signed)
Pt aware of dr Alla German comments

## 2018-11-11 NOTE — Telephone Encounter (Signed)
Let him know I sent the prescription If he has ongoing pain, we should schedule a visit

## 2018-11-11 NOTE — Telephone Encounter (Signed)
Best number [phone] Pt called wanting to get a refill on prednisone He stated his back and hip are very painful today  [facility] [address]  Offered an appointment  Pt declined

## 2018-11-17 DIAGNOSIS — R296 Repeated falls: Secondary | ICD-10-CM | POA: Diagnosis not present

## 2018-11-17 DIAGNOSIS — R2681 Unsteadiness on feet: Secondary | ICD-10-CM | POA: Diagnosis not present

## 2018-11-17 DIAGNOSIS — R262 Difficulty in walking, not elsewhere classified: Secondary | ICD-10-CM | POA: Diagnosis not present

## 2018-11-19 DIAGNOSIS — R2681 Unsteadiness on feet: Secondary | ICD-10-CM | POA: Diagnosis not present

## 2018-11-19 DIAGNOSIS — R262 Difficulty in walking, not elsewhere classified: Secondary | ICD-10-CM | POA: Diagnosis not present

## 2018-11-19 DIAGNOSIS — R296 Repeated falls: Secondary | ICD-10-CM | POA: Diagnosis not present

## 2018-11-23 ENCOUNTER — Other Ambulatory Visit: Payer: Self-pay | Admitting: Internal Medicine

## 2018-11-24 DIAGNOSIS — R2681 Unsteadiness on feet: Secondary | ICD-10-CM | POA: Diagnosis not present

## 2018-11-24 DIAGNOSIS — R262 Difficulty in walking, not elsewhere classified: Secondary | ICD-10-CM | POA: Diagnosis not present

## 2018-11-24 DIAGNOSIS — R296 Repeated falls: Secondary | ICD-10-CM | POA: Diagnosis not present

## 2018-11-26 DIAGNOSIS — R262 Difficulty in walking, not elsewhere classified: Secondary | ICD-10-CM | POA: Diagnosis not present

## 2018-11-26 DIAGNOSIS — R2681 Unsteadiness on feet: Secondary | ICD-10-CM | POA: Diagnosis not present

## 2018-11-26 DIAGNOSIS — R296 Repeated falls: Secondary | ICD-10-CM | POA: Diagnosis not present

## 2018-12-01 DIAGNOSIS — R262 Difficulty in walking, not elsewhere classified: Secondary | ICD-10-CM | POA: Diagnosis not present

## 2018-12-01 DIAGNOSIS — R296 Repeated falls: Secondary | ICD-10-CM | POA: Diagnosis not present

## 2018-12-01 DIAGNOSIS — R2681 Unsteadiness on feet: Secondary | ICD-10-CM | POA: Diagnosis not present

## 2018-12-03 DIAGNOSIS — R2681 Unsteadiness on feet: Secondary | ICD-10-CM | POA: Diagnosis not present

## 2018-12-03 DIAGNOSIS — R296 Repeated falls: Secondary | ICD-10-CM | POA: Diagnosis not present

## 2018-12-03 DIAGNOSIS — R262 Difficulty in walking, not elsewhere classified: Secondary | ICD-10-CM | POA: Diagnosis not present

## 2018-12-08 DIAGNOSIS — R262 Difficulty in walking, not elsewhere classified: Secondary | ICD-10-CM | POA: Diagnosis not present

## 2018-12-08 DIAGNOSIS — R296 Repeated falls: Secondary | ICD-10-CM | POA: Diagnosis not present

## 2018-12-08 DIAGNOSIS — R2681 Unsteadiness on feet: Secondary | ICD-10-CM | POA: Diagnosis not present

## 2018-12-10 DIAGNOSIS — R296 Repeated falls: Secondary | ICD-10-CM | POA: Diagnosis not present

## 2018-12-10 DIAGNOSIS — R262 Difficulty in walking, not elsewhere classified: Secondary | ICD-10-CM | POA: Diagnosis not present

## 2018-12-10 DIAGNOSIS — R2681 Unsteadiness on feet: Secondary | ICD-10-CM | POA: Diagnosis not present

## 2018-12-15 DIAGNOSIS — R2681 Unsteadiness on feet: Secondary | ICD-10-CM | POA: Diagnosis not present

## 2018-12-15 DIAGNOSIS — R296 Repeated falls: Secondary | ICD-10-CM | POA: Diagnosis not present

## 2018-12-15 DIAGNOSIS — R262 Difficulty in walking, not elsewhere classified: Secondary | ICD-10-CM | POA: Diagnosis not present

## 2018-12-17 DIAGNOSIS — R296 Repeated falls: Secondary | ICD-10-CM | POA: Diagnosis not present

## 2018-12-17 DIAGNOSIS — R2681 Unsteadiness on feet: Secondary | ICD-10-CM | POA: Diagnosis not present

## 2018-12-17 DIAGNOSIS — R262 Difficulty in walking, not elsewhere classified: Secondary | ICD-10-CM | POA: Diagnosis not present

## 2018-12-22 DIAGNOSIS — R262 Difficulty in walking, not elsewhere classified: Secondary | ICD-10-CM | POA: Diagnosis not present

## 2018-12-22 DIAGNOSIS — R2681 Unsteadiness on feet: Secondary | ICD-10-CM | POA: Diagnosis not present

## 2018-12-22 DIAGNOSIS — R296 Repeated falls: Secondary | ICD-10-CM | POA: Diagnosis not present

## 2018-12-24 DIAGNOSIS — R296 Repeated falls: Secondary | ICD-10-CM | POA: Diagnosis not present

## 2018-12-24 DIAGNOSIS — R2681 Unsteadiness on feet: Secondary | ICD-10-CM | POA: Diagnosis not present

## 2018-12-24 DIAGNOSIS — R262 Difficulty in walking, not elsewhere classified: Secondary | ICD-10-CM | POA: Diagnosis not present

## 2018-12-29 DIAGNOSIS — R296 Repeated falls: Secondary | ICD-10-CM | POA: Diagnosis not present

## 2018-12-29 DIAGNOSIS — R2681 Unsteadiness on feet: Secondary | ICD-10-CM | POA: Diagnosis not present

## 2018-12-29 DIAGNOSIS — R262 Difficulty in walking, not elsewhere classified: Secondary | ICD-10-CM | POA: Diagnosis not present

## 2018-12-31 DIAGNOSIS — R262 Difficulty in walking, not elsewhere classified: Secondary | ICD-10-CM | POA: Diagnosis not present

## 2018-12-31 DIAGNOSIS — R2681 Unsteadiness on feet: Secondary | ICD-10-CM | POA: Diagnosis not present

## 2018-12-31 DIAGNOSIS — R296 Repeated falls: Secondary | ICD-10-CM | POA: Diagnosis not present

## 2019-01-05 DIAGNOSIS — R262 Difficulty in walking, not elsewhere classified: Secondary | ICD-10-CM | POA: Diagnosis not present

## 2019-01-05 DIAGNOSIS — R2681 Unsteadiness on feet: Secondary | ICD-10-CM | POA: Diagnosis not present

## 2019-01-05 DIAGNOSIS — R296 Repeated falls: Secondary | ICD-10-CM | POA: Diagnosis not present

## 2019-01-07 DIAGNOSIS — R296 Repeated falls: Secondary | ICD-10-CM | POA: Diagnosis not present

## 2019-01-07 DIAGNOSIS — R2681 Unsteadiness on feet: Secondary | ICD-10-CM | POA: Diagnosis not present

## 2019-01-07 DIAGNOSIS — R262 Difficulty in walking, not elsewhere classified: Secondary | ICD-10-CM | POA: Diagnosis not present

## 2019-01-14 DIAGNOSIS — R262 Difficulty in walking, not elsewhere classified: Secondary | ICD-10-CM | POA: Diagnosis not present

## 2019-01-14 DIAGNOSIS — R296 Repeated falls: Secondary | ICD-10-CM | POA: Diagnosis not present

## 2019-01-14 DIAGNOSIS — R2681 Unsteadiness on feet: Secondary | ICD-10-CM | POA: Diagnosis not present

## 2019-01-19 DIAGNOSIS — R2681 Unsteadiness on feet: Secondary | ICD-10-CM | POA: Diagnosis not present

## 2019-01-19 DIAGNOSIS — R262 Difficulty in walking, not elsewhere classified: Secondary | ICD-10-CM | POA: Diagnosis not present

## 2019-01-19 DIAGNOSIS — R296 Repeated falls: Secondary | ICD-10-CM | POA: Diagnosis not present

## 2019-01-21 DIAGNOSIS — R2681 Unsteadiness on feet: Secondary | ICD-10-CM | POA: Diagnosis not present

## 2019-01-21 DIAGNOSIS — R262 Difficulty in walking, not elsewhere classified: Secondary | ICD-10-CM | POA: Diagnosis not present

## 2019-01-21 DIAGNOSIS — R296 Repeated falls: Secondary | ICD-10-CM | POA: Diagnosis not present

## 2019-01-25 DIAGNOSIS — R2681 Unsteadiness on feet: Secondary | ICD-10-CM | POA: Diagnosis not present

## 2019-01-25 DIAGNOSIS — R262 Difficulty in walking, not elsewhere classified: Secondary | ICD-10-CM | POA: Diagnosis not present

## 2019-01-25 DIAGNOSIS — R296 Repeated falls: Secondary | ICD-10-CM | POA: Diagnosis not present

## 2019-01-27 DIAGNOSIS — R2681 Unsteadiness on feet: Secondary | ICD-10-CM | POA: Diagnosis not present

## 2019-01-27 DIAGNOSIS — R262 Difficulty in walking, not elsewhere classified: Secondary | ICD-10-CM | POA: Diagnosis not present

## 2019-01-27 DIAGNOSIS — R296 Repeated falls: Secondary | ICD-10-CM | POA: Diagnosis not present

## 2019-02-02 DIAGNOSIS — R2681 Unsteadiness on feet: Secondary | ICD-10-CM | POA: Diagnosis not present

## 2019-02-02 DIAGNOSIS — R262 Difficulty in walking, not elsewhere classified: Secondary | ICD-10-CM | POA: Diagnosis not present

## 2019-02-02 DIAGNOSIS — R296 Repeated falls: Secondary | ICD-10-CM | POA: Diagnosis not present

## 2019-02-04 DIAGNOSIS — R262 Difficulty in walking, not elsewhere classified: Secondary | ICD-10-CM | POA: Diagnosis not present

## 2019-02-04 DIAGNOSIS — R2681 Unsteadiness on feet: Secondary | ICD-10-CM | POA: Diagnosis not present

## 2019-02-04 DIAGNOSIS — R296 Repeated falls: Secondary | ICD-10-CM | POA: Diagnosis not present

## 2019-02-09 DIAGNOSIS — R262 Difficulty in walking, not elsewhere classified: Secondary | ICD-10-CM | POA: Diagnosis not present

## 2019-02-09 DIAGNOSIS — R296 Repeated falls: Secondary | ICD-10-CM | POA: Diagnosis not present

## 2019-02-09 DIAGNOSIS — R2681 Unsteadiness on feet: Secondary | ICD-10-CM | POA: Diagnosis not present

## 2019-02-11 DIAGNOSIS — R2681 Unsteadiness on feet: Secondary | ICD-10-CM | POA: Diagnosis not present

## 2019-02-11 DIAGNOSIS — R296 Repeated falls: Secondary | ICD-10-CM | POA: Diagnosis not present

## 2019-02-11 DIAGNOSIS — R262 Difficulty in walking, not elsewhere classified: Secondary | ICD-10-CM | POA: Diagnosis not present

## 2019-02-23 DIAGNOSIS — R262 Difficulty in walking, not elsewhere classified: Secondary | ICD-10-CM | POA: Diagnosis not present

## 2019-02-23 DIAGNOSIS — R2681 Unsteadiness on feet: Secondary | ICD-10-CM | POA: Diagnosis not present

## 2019-02-23 DIAGNOSIS — R296 Repeated falls: Secondary | ICD-10-CM | POA: Diagnosis not present

## 2019-02-25 DIAGNOSIS — R262 Difficulty in walking, not elsewhere classified: Secondary | ICD-10-CM | POA: Diagnosis not present

## 2019-02-25 DIAGNOSIS — R296 Repeated falls: Secondary | ICD-10-CM | POA: Diagnosis not present

## 2019-02-25 DIAGNOSIS — R2681 Unsteadiness on feet: Secondary | ICD-10-CM | POA: Diagnosis not present

## 2019-03-01 ENCOUNTER — Emergency Department: Payer: PPO

## 2019-03-01 ENCOUNTER — Observation Stay
Admission: EM | Admit: 2019-03-01 | Discharge: 2019-03-03 | Disposition: A | Discharge status: Home / Self Care | Payer: PPO | Attending: Internal Medicine | Admitting: Internal Medicine

## 2019-03-01 ENCOUNTER — Other Ambulatory Visit: Payer: Self-pay

## 2019-03-01 ENCOUNTER — Encounter: Payer: Self-pay | Admitting: Emergency Medicine

## 2019-03-01 ENCOUNTER — Telehealth: Payer: Self-pay

## 2019-03-01 DIAGNOSIS — Z96653 Presence of artificial knee joint, bilateral: Secondary | ICD-10-CM | POA: Insufficient documentation

## 2019-03-01 DIAGNOSIS — K219 Gastro-esophageal reflux disease without esophagitis: Secondary | ICD-10-CM | POA: Diagnosis not present

## 2019-03-01 DIAGNOSIS — N400 Enlarged prostate without lower urinary tract symptoms: Secondary | ICD-10-CM | POA: Diagnosis not present

## 2019-03-01 DIAGNOSIS — I6782 Cerebral ischemia: Secondary | ICD-10-CM | POA: Diagnosis not present

## 2019-03-01 DIAGNOSIS — F418 Other specified anxiety disorders: Secondary | ICD-10-CM | POA: Diagnosis not present

## 2019-03-01 DIAGNOSIS — M170 Bilateral primary osteoarthritis of knee: Secondary | ICD-10-CM | POA: Insufficient documentation

## 2019-03-01 DIAGNOSIS — E785 Hyperlipidemia, unspecified: Secondary | ICD-10-CM | POA: Diagnosis not present

## 2019-03-01 DIAGNOSIS — I69354 Hemiplegia and hemiparesis following cerebral infarction affecting left non-dominant side: Secondary | ICD-10-CM | POA: Diagnosis not present

## 2019-03-01 DIAGNOSIS — Z7982 Long term (current) use of aspirin: Secondary | ICD-10-CM | POA: Insufficient documentation

## 2019-03-01 DIAGNOSIS — R42 Dizziness and giddiness: Secondary | ICD-10-CM | POA: Diagnosis not present

## 2019-03-01 DIAGNOSIS — Z79899 Other long term (current) drug therapy: Secondary | ICD-10-CM | POA: Diagnosis not present

## 2019-03-01 DIAGNOSIS — Z888 Allergy status to other drugs, medicaments and biological substances status: Secondary | ICD-10-CM | POA: Diagnosis not present

## 2019-03-01 DIAGNOSIS — I2510 Atherosclerotic heart disease of native coronary artery without angina pectoris: Secondary | ICD-10-CM | POA: Diagnosis not present

## 2019-03-01 DIAGNOSIS — Z20828 Contact with and (suspected) exposure to other viral communicable diseases: Secondary | ICD-10-CM | POA: Insufficient documentation

## 2019-03-01 DIAGNOSIS — Z03818 Encounter for observation for suspected exposure to other biological agents ruled out: Secondary | ICD-10-CM | POA: Diagnosis not present

## 2019-03-01 DIAGNOSIS — Z23 Encounter for immunization: Secondary | ICD-10-CM | POA: Diagnosis not present

## 2019-03-01 DIAGNOSIS — Z823 Family history of stroke: Secondary | ICD-10-CM | POA: Diagnosis not present

## 2019-03-01 DIAGNOSIS — Z880 Allergy status to penicillin: Secondary | ICD-10-CM | POA: Diagnosis not present

## 2019-03-01 DIAGNOSIS — I10 Essential (primary) hypertension: Secondary | ICD-10-CM | POA: Diagnosis not present

## 2019-03-01 LAB — COMPREHENSIVE METABOLIC PANEL
ALT: 30 U/L (ref 0–44)
AST: 32 U/L (ref 15–41)
Albumin: 4.5 g/dL (ref 3.5–5.0)
Alkaline Phosphatase: 61 U/L (ref 38–126)
Anion gap: 10 (ref 5–15)
BUN: 15 mg/dL (ref 8–23)
CO2: 24 mmol/L (ref 22–32)
Calcium: 9.5 mg/dL (ref 8.9–10.3)
Chloride: 103 mmol/L (ref 98–111)
Creatinine, Ser: 0.92 mg/dL (ref 0.61–1.24)
GFR calc Af Amer: >60 mL/min (ref >60)
GFR calc non Af Amer: >60 mL/min (ref >60)
Glucose, Bld: 122 mg/dL — ABNORMAL HIGH (ref 70–99)
Potassium: 4.1 mmol/L (ref 3.5–5.1)
Sodium: 137 mmol/L (ref 135–145)
Total Bilirubin: 1.1 mg/dL (ref 0.3–1.2)
Total Protein: 7.2 g/dL (ref 6.5–8.1)

## 2019-03-01 LAB — CBC
HCT: 43.7 % (ref 39.0–52.0)
Hemoglobin: 14.8 g/dL (ref 13.0–17.0)
MCH: 30.8 pg (ref 26.0–34.0)
MCHC: 33.9 g/dL (ref 30.0–36.0)
MCV: 91 fL (ref 80.0–100.0)
Platelets: 159 10*3/uL (ref 150–400)
RBC: 4.8 MIL/uL (ref 4.22–5.81)
RDW: 12.6 % (ref 11.5–15.5)
WBC: 5.9 10*3/uL (ref 4.0–10.5)
nRBC: 0 % (ref 0.0–0.2)

## 2019-03-01 LAB — GLUCOSE, CAPILLARY: Glucose-Capillary: 106 mg/dL — ABNORMAL HIGH (ref 70–99)

## 2019-03-01 MED ORDER — MECLIZINE HCL 25 MG PO TABS: 25.0000 mg | ORAL_TABLET | Freq: Three times a day (TID) | ORAL | 1 refills | Status: DC | PRN

## 2019-03-01 MED ORDER — PROMETHAZINE HCL 25 MG/ML IJ SOLN
12.5000 mg | Freq: Once | INTRAMUSCULAR | Status: AC
  Administered 2019-03-01: 12.5 mg via INTRAVENOUS

## 2019-03-01 MED ORDER — DIAZEPAM 5 MG PO TABS
5.0000 mg | ORAL_TABLET | Freq: Once | ORAL | Status: AC
  Administered 2019-03-01: 5 mg via ORAL
  Filled 2019-03-01: qty 1

## 2019-03-01 MED ORDER — ONDANSETRON HCL 4 MG/2ML IJ SOLN
INTRAMUSCULAR | Status: AC
  Filled 2019-03-01: qty 2

## 2019-03-01 MED ORDER — SODIUM CHLORIDE 0.9 % IV BOLUS
1000.0000 mL | Freq: Once | INTRAVENOUS | Status: AC
  Administered 2019-03-01: 19:00:00 1000 mL via INTRAVENOUS

## 2019-03-01 MED ORDER — MECLIZINE HCL 25 MG PO TABS
25.0000 mg | ORAL_TABLET | Freq: Once | ORAL | Status: AC
  Administered 2019-03-01: 25 mg via ORAL
  Filled 2019-03-01: qty 1

## 2019-03-01 NOTE — ED Notes (Signed)
Pt reports dizziness and nausea since this am.  Pt vomited on arrival to treatment room  No abd pain.  No diarrhea.  Iv started.  Pt had stroke 12/15.  Pt has left side deficit from [place].  Pt alert.  nsr on monitor.

## 2019-03-01 NOTE — ED Provider Notes (Signed)
[facility] Emergency Department Provider Note  ____________________________________________  Time seen: Approximately 7:07 PM  I have reviewed the triage vital signs and the nursing notes.   HISTORY  Chief Complaint Dizziness    HPI Keith Hall is a 73 y.o. male with a history of kidney stone, depression, right basal ganglia lacunar infarct, stroke who comes the ED complaining of dizziness.  He was in his usual state of health at bedtime last night, and when he woke up this morning at 9:30 AM he was feeling fine.  However, he rolled over and suddenly became very dizzy feeling like everything was in motion.  He sat up in bed and felt worse and also felt lightheaded.  Symptoms are constant.  Worse with change in position or turning his head.  No alleviating factors.   Denies any other associated symptoms.  He ate breakfast, rested throughout the day but has continued to feel vertiginous, lightheaded, and associated with nausea.  No chest pain shortness of breath palpitations headache vision changes paresthesias or weakness.  No falls or trauma.  No fevers chills or body aches.  No recent sick contacts although he does go to physical therapy 2 times a week.    On arrival to the treatment room, when he was suddenly changed from lying down to sitting up position he had an episode of vomiting and worse vertigo symptoms.  Patient notes that he is never had vertigo before.  Past Medical History:  Diagnosis Date  . BPH (benign prostatic hypertrophy)   . Depression 1980's04-Apr-2004   MDD---even needed hospitalization  . 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 Kidney stone 1980 only  . Osteoarthritis of both knees   . Stroke ([place])   . Sudden cardiac death [facility]) 19-Sep-1999   no clear etiology of this     Patient Active Problem List   Diagnosis Date Noted  . Carotid artery disease ([place]) 03/31/2017  . Low back  pain 03/13/2016  . Elevated LFTs 07/04/2015  . 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 Depression due to stroke 07/27/2014  . Advanced directives, counseling/discussion 02/07/2014  . Routine general medical examination at a health care facility 02/11/2013  . Osteoarthritis of both knees   . MDD (major depressive disorder), recurrent episode, moderate ([place])   . BPH (benign prostatic hyperplasia)      Past Surgical History:  Procedure Laterality Date  . CARDIAC CATHETERIZATION  last in Sep 19, 2007   insignificant coronary disease  . CARDIOVASCULAR STRESS TEST  07/09/11   Negative--Dr Paraschos  . HERNIA REPAIR  09/19/2010   left inguinal--Dr Byrnett  . ROTATOR CUFF REPAIR  September 19, 2007   both done by Dr Margaretmary Eddy  . TOTAL KNEE ARTHROPLASTY Right 2/14   Dr Marry Guan  . TOTAL KNEE ARTHROPLASTY Left 1/15   Dr [facility]     Prior to Admission medications   Medication Sig Start Date End Date Taking? Authorizing Provider  acetaminophen (TYLENOL) 500 MG tablet Take 500 mg by mouth 3 (three) times daily as needed.  06/21/14   [provider]  aspirin 81 MG chewable tablet Chew 81 mg by mouth daily.    [provider]  buPROPion (WELLBUTRIN SR) 150 MG 12 hr tablet TAKE 1 TABLET (150 MG TOTAL) BY MOUTH 2 (TWO) TIMES DAILY EVERY MORNING/AFTER LUNCH 03/18/18   Venia Carbon, MD  citalopram (CELEXA) 20 MG tablet  TAKE 1 TABLET (20 MG TOTAL) BY MOUTH DAILY. 11/23/18   Venia Carbon, MD  clonazePAM (KLONOPIN) 0.5 MG tablet Take 0.5 mg by mouth daily.    [provider]  Glucosamine-Chondroitin (MOVE FREE PO) Take 2 tablets by mouth daily.    [provider]  meclizine (ANTIVERT) 25 MG tablet Take 1 tablet (25 mg total) by mouth 3 (three) times daily as needed for dizziness or nausea. 03/01/19   Carrie Mew, MD  methylphenidate (RITALIN) 5 MG tablet Take 1 tablet (5 mg total) by mouth 2 (two) times daily. With  breakfast and lunch 07/24/18   Venia Carbon, MD  Multiple Vitamin (MULTIVITAMIN) tablet Take 2 tablets by mouth daily.     [provider]  predniSONE (DELTASONE) 20 MG tablet Take 2 tablets (40 mg total) by mouth daily. 11/11/18   Venia Carbon, MD     Allergies Atorvastatin and Penicillins   Family History  Problem Relation Age of Onset  . Stroke Mother   . COPD Sister   . Diabetes Son        Type 1  . Heart disease Neg Hx   . Hypertension Neg Hx     Social History Social History   Tobacco Use  . Smoking status: Never Smoker  . Smokeless tobacco: Never Used  Substance Use Topics  . Alcohol use: No  . Drug use: No    Review of Systems  Constitutional:   No fever or chills.  ENT:   No sore throat. No rhinorrhea. Cardiovascular:   No chest pain or syncope. Respiratory:   No dyspnea or cough. Gastrointestinal:   Negative for abdominal pain, vomiting and diarrhea.  Musculoskeletal:   Negative for focal pain or swelling All other systems reviewed and are negative except as documented above in ROS and HPI.  ____________________________________________   PHYSICAL EXAM:  VITAL SIGNS: ED Triage Vitals [03/01/19 1524]  Enc Vitals Group     BP 124/73     Pulse Rate 70     Resp 18     Temp 97.9 F (36.6 C)     Temp Source Oral     SpO2 96 %     Weight 200 lb (90.7 kg)     Height 5\' 8"  (1.727 m)     Head Circumference      Peak Flow      Pain Score 0     Pain Loc      Pain Edu?      Excl. in [place]?     Vital signs reviewed, nursing assessments reviewed.   Constitutional:   Alert and oriented. Non-toxic appearance. Eyes:   Conjunctivae are normal. EOMI. PERRL. ENT      Head:   Normocephalic and atraumatic.      Nose:   No congestion/rhinnorhea.       Mouth/Throat:   MMM, no pharyngeal erythema. No peritonsillar mass.       Neck:   No meningismus. Full ROM. Hematological/Lymphatic/Immunilogical:   No cervical lymphadenopathy. Cardiovascular:    RRR. Symmetric bilateral radial and DP pulses.  No murmurs. Cap refill less than 2 seconds. Respiratory:   Normal respiratory effort without tachypnea/retractions. Breath sounds are clear and equal bilaterally. No wheezes/rales/rhonchi. Gastrointestinal:   Soft and nontender. Non distended. There is no CVA tenderness.  No rebound, rigidity, or guarding.  Musculoskeletal:   Normal range of motion in all extremities. No joint effusions.  No lower extremity tenderness.  No edema. Neurologic:  Normal speech and language.  Motor grossly intact. No pronator drift.  Cerebellar function normal No acute focal neurologic deficits are appreciated.  Skin:    Skin is warm, dry and intact. No rash noted.  No petechiae, purpura, or bullae.  ____________________________________________    LABS (pertinent positives/negatives) (all labs ordered are listed, but only abnormal results are displayed) Labs Reviewed  GLUCOSE, CAPILLARY - Abnormal; Notable for the following components:      Result Value   Glucose-Capillary 106 (*)    All other components within normal limits  COMPREHENSIVE METABOLIC PANEL - Abnormal; Notable for the following components:   Glucose, Bld 122 (*)    All other components within normal limits  CBC  URINALYSIS, COMPLETE (UACMP) WITH MICROSCOPIC  CBG MONITORING, ED   ____________________________________________   EKG  Interpreted by me Normal sinus rhythm rate of 70, left axis, normal intervals.  Poor R wave progression in anterior precordial leads.  Normal ST segments and T waves.  No acute ischemic changes.  ____________________________________________    RADIOLOGY  Ct Head Wo Contrast  Result Date: 03/01/2019 CLINICAL DATA:  Per nurse note: pt reports he woke up this morning at 0930 and felt like he was very dizzy and off balance when walking. Pt reports having difficulty standing because feeling weak all over, not on one side. Also having nausea. Pt reports went to  bed at 1100 last night but did not fall asleep until about 1 am. Pt felt normal when went to bed. No slurred speech. History of hemorrhagic stroke. Grip strength is equal. Pt keeps stating "I don't feel coordinated at all and that is highly unusual". EXAM: CT HEAD WITHOUT CONTRAST TECHNIQUE: Contiguous axial images were obtained from the base of the skull through the vertex without intravenous contrast. COMPARISON:  07/22/2018 and older exams. FINDINGS: Brain: No evidence of acute infarction, hemorrhage, hydrocephalus, extra-axial collection or mass lesion/mass effect. There are well-defined areas of hypoattenuation in the deep cerebral white matter bilaterally and right basal ganglia consistent with old lacunar infarcts. Patchy white matter hypoattenuation is also noted consistent with mild chronic microvascular ischemic change. Vascular: No hyperdense vessel or unexpected calcification. Skull: Normal. Negative for fracture or focal lesion. Sinuses/Orbits: Visualized globes and orbits are unremarkable. The visualized sinuses and mastoid air cells are clear. Other: None. IMPRESSION: 1. No acute intracranial abnormalities. 2. Old lacunar infarcts and mild chronic microvascular ischemic change stable from the prior study. Electronically Signed   By: Lajean Manes M.D.   On: 03/01/2019 16:06    ____________________________________________   PROCEDURES Procedures  ____________________________________________  DIFFERENTIAL DIAGNOSIS   Intracranial hemorrhage, stroke, dehydration, peripheral vertigo, electrolyte abnormality  CLINICAL IMPRESSION / ASSESSMENT AND PLAN / ED COURSE  Medications ordered in the ED: Medications  sodium chloride 0.9 % bolus 1,000 mL (0 mLs Intravenous Stopped 03/01/19 2018)  meclizine (ANTIVERT) tablet 25 mg (25 mg Oral Given 03/01/19 1855)    Pertinent labs & imaging results that were available during my care of the patient were reviewed by me and considered in my medical  decision making (see chart for details).  Keith Hall was evaluated in Emergency Department on 03/01/2019 for the symptoms described in the history of present illness. He was evaluated in the context of the global COVID-19 pandemic, which necessitated consideration that the patient might be at risk for infection with the SARS-CoV-2 virus that causes COVID-19. Institutional protocols and algorithms that pertain to the evaluation of patients at risk for COVID-19 are in a state  of rapid change based on information released by regulatory bodies including the CDC and federal and state organizations. These policies and algorithms were followed during the patient's care in the ED.   Patient presents with sudden onset of vertigo on rolling over in bed this morning.  Most likely peripheral but with his age, comorbidities, prior stroke, CT head was obtained which is negative for acute infarct or hemorrhage.  We will further obtain an MRI to further risk stratify.  If negative I think he can be discharged home with supportive care for outpatient follow-up.  In the meantime while waiting for MRI I will give him IV fluid bolus for hydration and meclizine.  ----------------------------------------- 8:33 PM on 03/01/2019 -----------------------------------------  Patient in MRI.  Vital signs remain normal.  Signed out to Dr. Rip Harbour at 8:30 PM      ____________________________________________   FINAL CLINICAL IMPRESSION(S) / ED DIAGNOSES    Final diagnoses:  Vertigo     ED Discharge Orders         Ordered    meclizine (ANTIVERT) 25 MG tablet  3 times daily PRN     03/01/19 2033          Portions of this note were generated with dragon dictation software. Dictation errors may occur despite best attempts at proofreading.   Carrie Mew, MD 03/01/19 2033

## 2019-03-01 NOTE — ED Notes (Signed)
Patient transported to MRI 

## 2019-03-01 NOTE — Discharge Instructions (Addendum)
Take the Antivert one pill 3 times a day.  avoid fall.  Please follow-up with Dr. Ladene Artist, the ear nose and throat doctor.  Give his office a call tomorrow morning he should be able to see you within a few days or a week.  Please return here if your spinning sensation gets worse especially if you are having difficulty walking again.

## 2019-03-01 NOTE — ED Notes (Signed)
Pt given urinal.

## 2019-03-01 NOTE — ED Notes (Signed)
Pt here with c/o "vertigo like" dizziness this am, nausea without vomiting, stroke 5 years ago with left sided weakness post CVA, NAD at this time.

## 2019-03-01 NOTE — Telephone Encounter (Signed)
Per chart, he is at [facility] ER right now.

## 2019-03-01 NOTE — Telephone Encounter (Signed)
I don't see him in the ER as yet If there is no visit, call him tomorrow to see what is going on--otherwise I will review the ER note

## 2019-03-01 NOTE — ED Notes (Signed)
Pt vomiting in mri, meds given in mri for nausea per dr Joni Fears.

## 2019-03-01 NOTE — Telephone Encounter (Signed)
Pt calling and pt said this morning pt has more SOB, dizziness where room spins around,nausea and more weakness and numbness in lt leg and foot in which pt had stroke 2015.pt is not sure if has fever or not.Pt is concerned he needs to go to ED. Pt will go to ED now; pt is not sure he can walk to the car; pts wife took the phone and said she can get pt to [facility] ED and if she has problems she will call 911. FYI to Dr Silvio Pate.

## 2019-03-01 NOTE — ED Notes (Signed)
Report off to andrea rn  

## 2019-03-01 NOTE — ED Notes (Signed)
Pt alert

## 2019-03-01 NOTE — ED Provider Notes (Signed)
MRI read by radiology as no acute changes.  Patient is feeling better now.  We will let him go with some Antivert and low-dose Valium for a few days follow-up with ENT.   Nena Polio, MD 03/01/19 2350

## 2019-03-01 NOTE — ED Triage Notes (Signed)
Pt reports he woke up this morning at 0930 and felt like he was very dizzy and off balance when walking.  Pt reports having difficulty standing because feeling weak all over, not on one side.  Also having nausea.  Pt reports went to bed at 1100 last night but did not fall asleep until about 1 am.  Pt felt normal when went to bed.  No slurred speech. History of hemorrhagic stroke. Grip strength is equal.  Pt keeps stating "I don't feel coordinated at all and that is highly unusual".

## 2019-03-02 ENCOUNTER — Other Ambulatory Visit: Payer: Self-pay

## 2019-03-02 DIAGNOSIS — R270 Ataxia, unspecified: Secondary | ICD-10-CM | POA: Diagnosis not present

## 2019-03-02 DIAGNOSIS — F419 Anxiety disorder, unspecified: Secondary | ICD-10-CM | POA: Diagnosis not present

## 2019-03-02 DIAGNOSIS — R42 Dizziness and giddiness: Secondary | ICD-10-CM | POA: Diagnosis not present

## 2019-03-02 DIAGNOSIS — I2510 Atherosclerotic heart disease of native coronary artery without angina pectoris: Secondary | ICD-10-CM | POA: Diagnosis not present

## 2019-03-02 LAB — CBC
HCT: 40.2 % (ref 39.0–52.0)
Hemoglobin: 13.6 g/dL (ref 13.0–17.0)
MCH: 30.6 pg (ref 26.0–34.0)
MCHC: 33.8 g/dL (ref 30.0–36.0)
MCV: 90.3 fL (ref 80.0–100.0)
Platelets: 135 10*3/uL — ABNORMAL LOW (ref 150–400)
RBC: 4.45 MIL/uL (ref 4.22–5.81)
RDW: 12.6 % (ref 11.5–15.5)
WBC: 11.7 10*3/uL — ABNORMAL HIGH (ref 4.0–10.5)
nRBC: 0 % (ref 0.0–0.2)

## 2019-03-02 LAB — URINALYSIS, COMPLETE (UACMP) WITH MICROSCOPIC
Bacteria, UA: NONE SEEN
Bilirubin Urine: NEGATIVE
Glucose, UA: NEGATIVE mg/dL
Hgb urine dipstick: NEGATIVE
Ketones, ur: 5 mg/dL — AB
Leukocytes,Ua: NEGATIVE
Nitrite: NEGATIVE
Protein, ur: NEGATIVE mg/dL
Specific Gravity, Urine: 1.009 (ref 1.005–1.030)
Squamous Epithelial / HPF: NONE SEEN (ref 0–5)
pH: 6 (ref 5.0–8.0)

## 2019-03-02 LAB — BASIC METABOLIC PANEL
Anion gap: 8 (ref 5–15)
BUN: 15 mg/dL (ref 8–23)
CO2: 25 mmol/L (ref 22–32)
Calcium: 9.1 mg/dL (ref 8.9–10.3)
Chloride: 108 mmol/L (ref 98–111)
Creatinine, Ser: 0.85 mg/dL (ref 0.61–1.24)
GFR calc Af Amer: >60 mL/min (ref >60)
GFR calc non Af Amer: >60 mL/min (ref >60)
Glucose, Bld: 123 mg/dL — ABNORMAL HIGH (ref 70–99)
Potassium: 4.1 mmol/L (ref 3.5–5.1)
Sodium: 141 mmol/L (ref 135–145)

## 2019-03-02 LAB — SARS CORONAVIRUS 2 BY RT PCR (HOSPITAL ORDER, PERFORMED IN ~~LOC~~ HOSPITAL LAB): SARS Coronavirus 2: NEGATIVE

## 2019-03-02 MED ORDER — DIAZEPAM 2 MG PO TABS: 2.0000 mg | ORAL_TABLET | Freq: Two times a day (BID) | ORAL | 0 refills | Status: DC | PRN

## 2019-03-02 MED ORDER — BUPROPION HCL ER (SR) 150 MG PO TB12
150.0000 mg | ORAL_TABLET | Freq: Every day | ORAL | Status: DC
  Administered 2019-03-02 – 2019-03-03 (×2): 150 mg via ORAL
  Filled 2019-03-02 (×2): qty 1

## 2019-03-02 MED ORDER — ENOXAPARIN SODIUM 40 MG/0.4ML ~~LOC~~ SOLN
40.0000 mg | Freq: Every day | SUBCUTANEOUS | Status: DC
  Administered 2019-03-02: 40 mg via SUBCUTANEOUS
  Filled 2019-03-02 (×2): qty 0.4

## 2019-03-02 MED ORDER — TRAZODONE HCL 50 MG PO TABS
25.0000 mg | ORAL_TABLET | Freq: Every evening | ORAL | Status: DC | PRN
  Filled 2019-03-02: qty 0.5

## 2019-03-02 MED ORDER — ONDANSETRON HCL 4 MG PO TABS
4.0000 mg | ORAL_TABLET | Freq: Four times a day (QID) | ORAL | Status: DC | PRN
  Filled 2019-03-02: qty 1

## 2019-03-02 MED ORDER — METHYLPHENIDATE HCL 10 MG PO TABS
5.0000 mg | ORAL_TABLET | ORAL | Status: DC
  Filled 2019-03-02 (×2): qty 1

## 2019-03-02 MED ORDER — CITALOPRAM HYDROBROMIDE 20 MG PO TABS
20.0000 mg | ORAL_TABLET | Freq: Every day | ORAL | Status: DC
  Administered 2019-03-02 – 2019-03-03 (×2): 20 mg via ORAL
  Filled 2019-03-02 (×2): qty 1

## 2019-03-02 MED ORDER — INFLUENZA VAC A&B SA ADJ QUAD 0.5 ML IM PRSY
0.5000 mL | PREFILLED_SYRINGE | INTRAMUSCULAR | Status: AC
  Administered 2019-03-03: 08:00:00 0.5 mL via INTRAMUSCULAR
  Filled 2019-03-02: qty 0.5

## 2019-03-02 MED ORDER — ASPIRIN EC 81 MG PO TBEC
81.0000 mg | DELAYED_RELEASE_TABLET | Freq: Every day | ORAL | Status: DC
  Administered 2019-03-02 – 2019-03-03 (×2): 81 mg via ORAL
  Filled 2019-03-02 (×2): qty 1

## 2019-03-02 MED ORDER — ADULT MULTIVITAMIN W/MINERALS CH
2.0000 | ORAL_TABLET | Freq: Every day | ORAL | Status: DC
  Administered 2019-03-02: 2 via ORAL
  Administered 2019-03-03: 1 via ORAL
  Filled 2019-03-02 (×2): qty 2

## 2019-03-02 MED ORDER — CLONAZEPAM 0.5 MG PO TABS
0.5000 mg | ORAL_TABLET | Freq: Every day | ORAL | Status: DC
  Administered 2019-03-02 – 2019-03-03 (×2): 0.5 mg via ORAL
  Filled 2019-03-02 (×2): qty 1

## 2019-03-02 MED ORDER — MECLIZINE HCL 25 MG PO TABS: 12.5000 mg | ORAL_TABLET | Freq: Three times a day (TID) | ORAL | 1 refills | Status: DC | PRN

## 2019-03-02 MED ORDER — ONDANSETRON HCL 4 MG/2ML IJ SOLN: 4.0000 mg | Freq: Four times a day (QID) | INTRAMUSCULAR | Status: DC | PRN

## 2019-03-02 MED ORDER — ASPIRIN 81 MG PO CHEW: 81.0000 mg | CHEWABLE_TABLET | Freq: Every day | ORAL | Status: DC

## 2019-03-02 MED ORDER — MECLIZINE HCL 12.5 MG PO TABS
12.5000 mg | ORAL_TABLET | Freq: Three times a day (TID) | ORAL | Status: DC | PRN
  Filled 2019-03-02: qty 1

## 2019-03-02 MED ORDER — SODIUM CHLORIDE 0.9 % IV SOLN
INTRAVENOUS | Status: DC
  Administered 2019-03-02 (×2): via INTRAVENOUS

## 2019-03-02 MED ORDER — ACETAMINOPHEN 650 MG RE SUPP
650.0000 mg | Freq: Four times a day (QID) | RECTAL | Status: DC | PRN
  Filled 2019-03-02: qty 1

## 2019-03-02 MED ORDER — ACETAMINOPHEN 325 MG PO TABS
650.0000 mg | ORAL_TABLET | Freq: Four times a day (QID) | ORAL | Status: DC | PRN
  Administered 2019-03-02: 325 mg via ORAL
  Filled 2019-03-02: qty 2

## 2019-03-02 MED ORDER — MAGNESIUM HYDROXIDE 400 MG/5ML PO SUSP
30.0000 mL | Freq: Every day | ORAL | Status: DC | PRN
  Filled 2019-03-02: qty 30

## 2019-03-02 NOTE — Progress Notes (Signed)
[place] [facility] [place] NAME: Keith Hall    MR#:  [id number]  DATE OF BIRTH:  June 19, 1945  SUBJECTIVE:  CHIEF COMPLAINT:   Chief Complaint  Patient presents with  . Dizziness   Came with dizziness.  Has history of old stroke and left-sided weakness. Walked with physical therapy.  REVIEW OF SYSTEMS:  CONSTITUTIONAL: No fever, have fatigue or weakness.  EYES: No blurred or double vision.  EARS, NOSE, AND THROAT: No tinnitus or ear pain.  RESPIRATORY: No cough, shortness of breath, wheezing or hemoptysis.  CARDIOVASCULAR: No chest pain, orthopnea, edema.  GASTROINTESTINAL: No nausea, vomiting, diarrhea or abdominal pain.  GENITOURINARY: No dysuria, hematuria.  ENDOCRINE: No polyuria, nocturia,  HEMATOLOGY: No anemia, easy bruising or bleeding SKIN: No rash or lesion. MUSCULOSKELETAL: No joint pain or arthritis.   NEUROLOGIC: No tingling, numbness, weakness.  PSYCHIATRY: No anxiety or depression.   ROS  DRUG ALLERGIES:   Allergies  Allergen Reactions  . Atorvastatin Other (See Comments)    Elevated LFTs  . Penicillins Hives, Swelling and Rash    Did it involve swelling of the face/tongue/throat, SOB, or low BP? Yes Did it involve sudden or severe rash/hives, skin peeling, or any reaction on the inside of your mouth or nose? Yes Did you need to seek medical attention at a hospital or doctor's office? Yes When did it last happen?15 years If all above answers are "NO", may proceed with cephalosporin use.    VITALS:  Blood pressure (!) 117/57, pulse 75, temperature 98.4 F (36.9 C), resp. rate 17, height 5\' 8"  (1.727 m), weight 94.6 kg, SpO2 95 %.  PHYSICAL EXAMINATION:  GENERAL:  73 y.o.-year-old patient lying in the bed with no acute distress.  EYES: Pupils equal, round, reactive to light and accommodation. No scleral icterus. Extraocular muscles intact.  HEENT: Head atraumatic, normocephalic. Oropharynx and nasopharynx  clear.  NECK:  Supple, no jugular venous distention. No thyroid enlargement, no tenderness.  LUNGS: Normal breath sounds bilaterally, no wheezing, rales,rhonchi or crepitation. No use of accessory muscles of respiration.  CARDIOVASCULAR: S1, S2 normal. No murmurs, rubs, or gallops.  ABDOMEN: Soft, nontender, nondistended. Bowel sounds present. No organomegaly or mass.  EXTREMITIES: No pedal edema, cyanosis, or clubbing.  NEUROLOGIC: Cranial nerves II through XII are intact. Muscle strength 3-4/5 in left side and 4-5/5 in right side  extremities. Sensation intact. Gait not checked.  PSYCHIATRIC: The patient is alert and oriented x 3.  SKIN: No obvious rash, lesion, or ulcer.   Physical Exam LABORATORY PANEL:   CBC Recent Labs  Lab 03/02/19 0456  WBC 11.7*  HGB 13.6  HCT 40.2  PLT 135*   ------------------------------------------------------------------------------------------------------------------  Chemistries  Recent Labs  Lab 03/01/19 1545 03/02/19 0456  NA 137 141  K 4.1 4.1  CL 103 108  CO2 24 25  GLUCOSE 122* 123*  BUN 15 15  CREATININE 0.92 0.85  CALCIUM 9.5 9.1  AST 32  --   ALT 30  --   ALKPHOS 61  --   BILITOT 1.1  --    ------------------------------------------------------------------------------------------------------------------  Cardiac Enzymes No results for input(s): TROPONINI in the last 168 hours. ------------------------------------------------------------------------------------------------------------------  RADIOLOGY:  Ct Head Wo Contrast  Result Date: 03/01/2019 CLINICAL DATA:  Per nurse note: pt reports he woke up this morning at 0930 and felt like he was very dizzy and off balance when walking. Pt reports having difficulty standing because feeling weak all over, not on one side. Also  having nausea. Pt reports went to bed at 1100 last night but did not fall asleep until about 1 am. Pt felt normal when went to bed. No slurred speech. History  of hemorrhagic stroke. Grip strength is equal. Pt keeps stating "I don't feel coordinated at all and that is highly unusual". EXAM: CT HEAD WITHOUT CONTRAST TECHNIQUE: Contiguous axial images were obtained from the base of the skull through the vertex without intravenous contrast. COMPARISON:  07/22/2018 and older exams. FINDINGS: Brain: No evidence of acute infarction, hemorrhage, hydrocephalus, extra-axial collection or mass lesion/mass effect. There are well-defined areas of hypoattenuation in the deep cerebral white matter bilaterally and right basal ganglia consistent with old lacunar infarcts. Patchy white matter hypoattenuation is also noted consistent with mild chronic microvascular ischemic change. Vascular: No hyperdense vessel or unexpected calcification. Skull: Normal. Negative for fracture or focal lesion. Sinuses/Orbits: Visualized globes and orbits are unremarkable. The visualized sinuses and mastoid air cells are clear. Other: None. IMPRESSION: 1. No acute intracranial abnormalities. 2. Old lacunar infarcts and mild chronic microvascular ischemic change stable from the prior study. Electronically Signed   By: Lajean Manes M.D.   On: 03/01/2019 16:06   Mr Brain Wo Contrast  Result Date: 03/01/2019 CLINICAL DATA:  73 year old male with dizziness. EXAM: MRI HEAD WITHOUT CONTRAST TECHNIQUE: Multiplanar, multiecho pulse sequences of the brain and surrounding structures were obtained without intravenous contrast. COMPARISON:  Head CT without contrast earlier today. Brain MRI 07/05/2014. FINDINGS: Brain: No restricted diffusion or evidence of acute infarction. Chronic hemorrhagic infarct of the posterior right corona radiata and thalamus with expected evolution since 2016. Subsequent brainstem Wallerian degeneration has developed. Progressed bilateral cerebral white matter T2 and FLAIR hyperintensity since 2016, now confluent in both hemispheres. Bilateral deep white matter capsule involvement. No  midline shift, mass effect, evidence of mass lesion, ventriculomegaly, extra-axial collection or acute intracranial hemorrhage. Cervicomedullary junction and pituitary are within normal limits. Vascular: Major intracranial vascular flow voids are stable since 2016. Skull and upper cervical spine: Negative for age visible cervical spine. Normal bone marrow signal. Sinuses/Orbits: Stable and negative. Other: Mastoids remain clear. Visible internal auditory structures appear stable and within normal limits. Scalp and face soft tissues appear negative. IMPRESSION: 1. No acute intracranial abnormality. 2. Chronic hemorrhagic infarct of the right corona radiata and thalamus with Wallerian degeneration since 2016. 3. Progressed signal abnormality in the bilateral cerebral white matter since 2016, favor small vessel disease related. Electronically Signed   By: Genevie Ann M.D.   On: 03/01/2019 23:32    ASSESSMENT AND PLAN:   Active Problems:   Vertigo  1.  Intractable vertigo with associated ataxia. - likely benign positional vertigo given negative brain MRI, and ruling out vertebrobasilar insufficiency. - We will follow neuro checks every 4 hours for 24 hours. - on p.o. meclizine. - Physical therapy consult for vestibular rehabilitation. - Called neurology consult.  2.  Depression with anxiety.   -His Wellbutrin SR, Celexa,  Klonopin and PRN Valium  resumed.  3.  Coronary artery disease.   -His aspirin will be resumed.  4.  History of CVA with left-sided hemiparesis. -His muscle strength is almost equal on both sides. - He will be placed on aspirin as mentioned above.  5.  DVT prophylaxis. - Subcutaneous Lovenox    All the records are reviewed and case discussed with Care Management/Social Workerr. Management plans discussed with the patient, family and they are in agreement.  CODE STATUS: full.  TOTAL TIME TAKING CARE OF THIS  PATIENT: 35  minutes.     POSSIBLE D/C IN 1-2 DAYS,  DEPENDING ON CLINICAL CONDITION.   Vaughan Basta M.D on 03/02/2019   Between 7am to 6pm - Pager - [phone]  After 6pm go to [email or web address] - password [email or web address] [facility]  Office  [phone]  CC: Primary care physician; Venia Carbon, MD  Note: This dictation was prepared with Dragon dictation along with smaller phrase technology. Any transcriptional errors that result from this process are unintentional.

## 2019-03-02 NOTE — ED Notes (Signed)
This RN to bedside at this time, introduced self to patient, assisted patient to use the urinal at this time, patient tolerated well. Pt able to stand with standby assist. Pt placed on cardiac monitor at this time. Bed alarm placed due to patient being high fall risk. Apologized and explained delay to patient. Pt states understanding. Will continue to monitor for further patient needs.

## 2019-03-02 NOTE — TOC Initial Note (Signed)
Transition of Care [facility]) - Initial/Assessment Note    Patient Details  Name: Keith Hall MRN: [id number] Date of Birth: 03-13-1946  Transition of Care [facility]) CM/SW Contact:    Shelbie Hutching, RN Phone Number: 03/02/2019, 3:18 PM  Clinical Narrative:                 Patient placed in observation for vertigo.  Patient is from home with wife.  Patient is already going to outpatient PT at [facility] physical therapy.  Wife is at the bedside and reports that patient has all needed DME at home.   RNCM will follow for additional needs.   Expected Discharge Plan: OP Rehab Barriers to Discharge: Continued Medical Work up   Patient Goals and CMS Choice        Expected Discharge Plan and Services Expected Discharge Plan: OP Rehab       Living arrangements for the past 2 months: Single Family Home Expected Discharge Date: 03/03/19                                    Prior Living Arrangements/Services Living arrangements for the past 2 months: Single Family Home Lives with:: Spouse Patient language and need for interpreter reviewed:: No Do you feel safe going back to the place where you live?: Yes      Need for Family Participation in Patient Care: Yes (Comment)(vertigo) Care giver support system in place?: Yes (comment)(wife)   Criminal Activity/Legal Involvement Pertinent to Current Situation/Hospitalization: No - Comment as needed  Activities of Daily Living Home Assistive Devices/Equipment: Cane (specify quad or straight), Walker (specify type) ADL Screening (condition at time of admission) Patient's cognitive ability adequate to safely complete daily activities?: Yes Is the patient deaf or have difficulty hearing?: Yes Does the patient have difficulty seeing, even when wearing glasses/contacts?: No Does the patient have difficulty concentrating, remembering, or making decisions?: No Patient able to express need for assistance with ADLs?: Yes Does the patient have  difficulty dressing or bathing?: No Independently performs ADLs?: Yes (appropriate for developmental age) Does the patient have difficulty walking or climbing stairs?: Yes Weakness of Legs: Left Weakness of Arms/Hands: Left  Permission Sought/Granted Permission sought to share information with : Case Manager, Family Supports Permission granted to share information with : Yes, Verbal Permission Granted        Permission granted to share info w Relationship: wife     Emotional Assessment Appearance:: Appears stated age Attitude/Demeanor/Rapport: Engaged Affect (typically observed): Accepting Orientation: : Oriented to Self, Oriented to Place, Oriented to  Time, Oriented to Situation Alcohol / Substance Use: Not Applicable Psych Involvement: No (comment)  Admission diagnosis:  Vertigo [R42] Patient Active Problem List   Diagnosis Date Noted  . Vertigo 03/02/2019  . Carotid artery disease ([place]) 03/31/2017  . Low back pain 03/13/2016  . Elevated LFTs 07/04/2015  . 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 Depression due to stroke 07/27/2014  . Advanced directives, counseling/discussion 02/07/2014  . Routine general medical examination at a health care facility 01/17/2013  . Osteoarthritis of both knees   . MDD (major depressive disorder), recurrent episode, moderate ([place])   . BPH (benign prostatic hyperplasia)    PCP:  Venia Carbon, MD Pharmacy:   [id number] - [place], [place] [address] [place] [address] Phone: [phone] Fax: [phone]  [facility] - [place], [address] [address] Phone: [phone] Fax: [phone]     Social Determinants of Health (SDOH) Interventions    Readmission Risk Interventions No flowsheet data found.

## 2019-03-02 NOTE — ED Provider Notes (Signed)
_________________________ 1:22 AM on 03/02/2019 -----------------------------------------  Patient is extremely unsteady and having difficulty walk due to dizziness.  Work-up has been essentially negative but vital feel that is safe for patient to go home at this time based on his gait.  Discussed with his wife on the phone who is in agreement with patient being admitted to the hospital.  Will discuss with the hospitalist for admission.   Alfred Levins, [place], MD 03/02/19 [phone]

## 2019-03-02 NOTE — ED Notes (Signed)
Pt given phone to call wife. Pt pending discharge.

## 2019-03-02 NOTE — ED Notes (Signed)
Pt assisted with use of urinal at bedside. Tolerated well. Remains unsteady with one person assist required. Pt continues to complain of dizziness. Provided for comfort and safety and will continue to assess.

## 2019-03-02 NOTE — H&P (Addendum)
[place] [facility] [place] NAME: Keith Hall    MR#:  [id number]  DATE OF BIRTH:  July 30, 1945  DATE OF ADMISSION:  03/01/2019  PRIMARY CARE PHYSICIAN: Venia Carbon, MD   REQUESTING/REFERRING PHYSICIAN: Gonzella Lex, MD  CHIEF COMPLAINT:   Chief Complaint  Patient presents with  . Dizziness    HISTORY OF PRESENT ILLNESS:  Keith Hall  is a 73 y.o. Caucasian male with a known history of CVA with left-sided weakness, depression/anxiety, coronary artery disease, hypertension and carotid artery disease, who presented to the emergency room with acute onset of intractable vertigo unsteady gait since yesterday morning.  He had associated nausea and vomiting x5 with no abdominal pain.  He denied any chest pain or dyspnea or palpitations.  He has been having occasional paresthesias in left upper and lower extremity letter not unusual with his CVA.  They have not been worsening though.  He denies any fever or chills.  No headache or diplopia or tinnitus.  No urinary or stool incontinence.  No dysuria, oliguria or hematuria or flank pain.  Upon presentation to the emergency room, vital signs were normal.  Labs revealed unremarkable CMP and CBC as well as urinalysis.  Noncontrasted head CT scan revealed old lacunar infarcts in the deep cerebral white matter bilaterally and right basal ganglia and mild chronic microvascular ischemic changes with no acute intracranial abnormalities.  Brain MRI revealed chronic hemorrhagic infarct of the right corona radiata and thalamus with wallerian degeneration since 2014-09-26, small vessel disease in both cerebral white matter with no acute intracranial abnormalities.  The patient was given p.o. meclizine twice, p.o. Valium, Phenergan and 1 L bolus of IV normal saline.  He was still significantly unsteady on his feet.  He will be admitted to an observation medically monitored bed for further evaluation and management   PAST MEDICAL HISTORY:   Past Medical History:  Diagnosis Date  . BPH (benign prostatic hypertrophy)   . Depression 1980's04/04/2003   MDD---even needed hospitalization  . 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 Kidney stone 1980 only  . Osteoarthritis of both knees   . Stroke ([place])   . Sudden cardiac death [facility]) 26-Sep-1999   no clear etiology of this  Coronary artery disease, carotid artery disease, hypertension, GERD, hemorrhagic CVA with left-sided weakness and Dyslipidemia PAST SURGICAL HISTORY:   Past Surgical History:  Procedure Laterality Date  . CARDIAC CATHETERIZATION  last in 09/26/07   insignificant coronary disease  . CARDIOVASCULAR STRESS TEST  07/09/11   Negative--Dr Paraschos  . HERNIA REPAIR  09-26-10   left inguinal--Dr Byrnett  . ROTATOR CUFF REPAIR  2007-09-26   both done by Dr Margaretmary Eddy  . TOTAL KNEE ARTHROPLASTY Right 2/14   Dr Marry Guan  . TOTAL KNEE ARTHROPLASTY Left 1/15   Dr [facility]    SOCIAL HISTORY:   Social History   Tobacco Use  . Smoking status: Never Smoker  . Smokeless tobacco: Never Used  Substance Use Topics  . Alcohol use: No    FAMILY HISTORY:   Family History  Problem Relation Age of Onset  . Stroke Mother   . COPD Sister   . Diabetes Son        Type 1  . Heart disease Neg Hx   . Hypertension Neg Hx     DRUG ALLERGIES:   Allergies  Allergen Reactions  . Atorvastatin Other (See  Comments)    Elevated LFTs  . Penicillins Hives, Swelling and Rash    Did it involve swelling of the face/tongue/throat, SOB, or low BP? Yes Did it involve sudden or severe rash/hives, skin peeling, or any reaction on the inside of your mouth or nose? Yes Did you need to seek medical attention at a hospital or doctor's office? Yes When did it last happen?15 years If all above answers are "NO", may proceed with cephalosporin use.    REVIEW OF SYSTEMS:   ROS As per history of present illness. All  pertinent systems were reviewed above. Constitutional,  HEENT, cardiovascular, respiratory, GI, GU, musculoskeletal, neuro, psychiatric, endocrine,  integumentary and hematologic systems were reviewed and are otherwise  negative/unremarkable except for positive findings mentioned above in the HPI.   MEDICATIONS AT HOME:   Prior to Admission medications   Medication Sig Start Date End Date Taking? Authorizing Provider  acetaminophen (TYLENOL) 500 MG tablet Take 500 mg by mouth 3 (three) times daily as needed.  06/21/14   [provider]  aspirin 81 MG chewable tablet Chew 81 mg by mouth daily.    [provider]  buPROPion (WELLBUTRIN SR) 150 MG 12 hr tablet TAKE 1 TABLET (150 MG TOTAL) BY MOUTH 2 (TWO) TIMES DAILY EVERY MORNING/AFTER LUNCH 03/18/18   Viviana Simpler I, MD  citalopram (CELEXA) 20 MG tablet TAKE 1 TABLET (20 MG TOTAL) BY MOUTH DAILY. 11/23/18   Venia Carbon, MD  clonazePAM (KLONOPIN) 0.5 MG tablet Take 0.5 mg by mouth daily.    [provider]  diazepam (VALIUM) 2 MG tablet Take 1 tablet (2 mg total) by mouth every 12 (twelve) hours as needed (vertigo). 03/02/19 03/01/20  Nena Polio, MD  Glucosamine-Chondroitin (MOVE FREE PO) Take 2 tablets by mouth daily.    [provider]  meclizine (ANTIVERT) 25 MG tablet Take 0.5 tablets (12.5 mg total) by mouth 3 (three) times daily as needed for dizziness or nausea. 03/02/19   Nena Polio, MD  methylphenidate (RITALIN) 5 MG tablet Take 1 tablet (5 mg total) by mouth 2 (two) times daily. With breakfast and lunch 07/24/18   Venia Carbon, MD  Multiple Vitamin (MULTIVITAMIN) tablet Take 2 tablets by mouth daily.     [provider]  predniSONE (DELTASONE) 20 MG tablet Take 2 tablets (40 mg total) by mouth daily. 11/11/18   Venia Carbon, MD      VITAL SIGNS:  Blood pressure (!) 142/83, pulse 84, temperature 97.9 F (36.6 C), temperature source Oral, resp. rate 16, height 5\' 8"  (1.727  m), weight 90.7 kg, SpO2 96 %.  PHYSICAL EXAMINATION:  Physical Exam  GENERAL:  73 y.o.-year-old Caucasian male patient lying in the bed with no acute distress.  EYES: Pupils equal, round, reactive to light and accommodation. No scleral icterus. Extraocular muscles intact.  HEENT: Head atraumatic, normocephalic. Oropharynx and nasopharynx clear.  NECK:  Supple, no jugular venous distention. No thyroid enlargement, no tenderness.  LUNGS: Normal breath sounds bilaterally, no wheezing, rales,rhonchi or crepitation. No use of accessory muscles of respiration.  CARDIOVASCULAR: Regular rate and rhythm, S1, S2 normal. 2/6 systolic ejection murmur at the left lower sternal border with no rubs or gallops.  ABDOMEN: Soft, nondistended, nontender. Bowel sounds present. No organomegaly or mass.  EXTREMITIES: No pedal edema, cyanosis, or clubbing.  NEUROLOGIC: Cranial nerves II through XII are intact. Muscle strength 5/5 in all extremities. Sensation intact. Gait not checked.  No nystagmus on Hallpike maneuver. PSYCHIATRIC:  The patient is alert and oriented x 3.  Normal affect and good eye contact. SKIN: No obvious rash, lesion, or ulcer.   LABORATORY PANEL:   CBC Recent Labs  Lab 03/01/19 1545  WBC 5.9  HGB 14.8  HCT 43.7  PLT 159   ------------------------------------------------------------------------------------------------------------------  Chemistries  Recent Labs  Lab 03/01/19 1545  NA 137  K 4.1  CL 103  CO2 24  GLUCOSE 122*  BUN 15  CREATININE 0.92  CALCIUM 9.5  AST 32  ALT 30  ALKPHOS 61  BILITOT 1.1   ------------------------------------------------------------------------------------------------------------------  Cardiac Enzymes No results for input(s): TROPONINI in the last 168 hours. ------------------------------------------------------------------------------------------------------------------  RADIOLOGY:  Ct Head Wo Contrast  Result Date: 03/01/2019  CLINICAL DATA:  Per nurse note: pt reports he woke up this morning at 0930 and felt like he was very dizzy and off balance when walking. Pt reports having difficulty standing because feeling weak all over, not on one side. Also having nausea. Pt reports went to bed at 1100 last night but did not fall asleep until about 1 am. Pt felt normal when went to bed. No slurred speech. History of hemorrhagic stroke. Grip strength is equal. Pt keeps stating "I don't feel coordinated at all and that is highly unusual". EXAM: CT HEAD WITHOUT CONTRAST TECHNIQUE: Contiguous axial images were obtained from the base of the skull through the vertex without intravenous contrast. COMPARISON:  07/22/2018 and older exams. FINDINGS: Brain: No evidence of acute infarction, hemorrhage, hydrocephalus, extra-axial collection or mass lesion/mass effect. There are well-defined areas of hypoattenuation in the deep cerebral white matter bilaterally and right basal ganglia consistent with old lacunar infarcts. Patchy white matter hypoattenuation is also noted consistent with mild chronic microvascular ischemic change. Vascular: No hyperdense vessel or unexpected calcification. Skull: Normal. Negative for fracture or focal lesion. Sinuses/Orbits: Visualized globes and orbits are unremarkable. The visualized sinuses and mastoid air cells are clear. Other: None. IMPRESSION: 1. No acute intracranial abnormalities. 2. Old lacunar infarcts and mild chronic microvascular ischemic change stable from the prior study. Electronically Signed   By: Lajean Manes M.D.   On: 03/01/2019 16:06   Mr Brain Wo Contrast  Result Date: 03/01/2019 CLINICAL DATA:  73 year old male with dizziness. EXAM: MRI HEAD WITHOUT CONTRAST TECHNIQUE: Multiplanar, multiecho pulse sequences of the brain and surrounding structures were obtained without intravenous contrast. COMPARISON:  Head CT without contrast earlier today. Brain MRI 07/05/2014. FINDINGS: Brain: No restricted  diffusion or evidence of acute infarction. Chronic hemorrhagic infarct of the posterior right corona radiata and thalamus with expected evolution since 2016. Subsequent brainstem Wallerian degeneration has developed. Progressed bilateral cerebral white matter T2 and FLAIR hyperintensity since 2016, now confluent in both hemispheres. Bilateral deep white matter capsule involvement. No midline shift, mass effect, evidence of mass lesion, ventriculomegaly, extra-axial collection or acute intracranial hemorrhage. Cervicomedullary junction and pituitary are within normal limits. Vascular: Major intracranial vascular flow voids are stable since 2016. Skull and upper cervical spine: Negative for age visible cervical spine. Normal bone marrow signal. Sinuses/Orbits: Stable and negative. Other: Mastoids remain clear. Visible internal auditory structures appear stable and within normal limits. Scalp and face soft tissues appear negative. IMPRESSION: 1. No acute intracranial abnormality. 2. Chronic hemorrhagic infarct of the right corona radiata and thalamus with Wallerian degeneration since 2016. 3. Progressed signal abnormality in the bilateral cerebral white matter since 2016, favor small vessel disease related. Electronically Signed   By: Genevie Ann M.D.   On: 03/01/2019 23:32  IMPRESSION AND PLAN:   1.  Intractable vertigo with associated ataxia. -This likely benign positional vertigo given negative brain MRI, and ruling out vertebrobasilar insufficiency. -The patient will be admitted to an observation medically monitored bed. -We will follow neuro checks every 4 hours for 24 hours. -We will place the patient on p.o. meclizine. -Physical therapy consult to be obtained for vestibular rehabilitation.  2.  Depression with anxiety.   -His Wellbutrin SR, Celexa,  Klonopin and PRN Valium will be resumed.  3.  Coronary artery disease.   -His aspirin will be resumed.  4.  History of CVA with left-sided  hemiparesis. -His muscle strength is almost equal on both sides. - He will be placed on aspirin as mentioned above.  5.  DVT prophylaxis. - Subcutaneous Lovenox    All the records are reviewed and case discussed with ED provider. The plan of care was discussed in details with the patient (and family). I answered all questions. The patient agreed to proceed with the above mentioned plan. Further management will depend upon hospital course.   CODE STATUS: Full code  TOTAL TIME TAKING CARE OF THIS PATIENT: 50 minutes.    Christel Mormon M.D on 03/02/2019 at 2:09 AM  Pager - [phone]  After 6pm go to [email or web address] - Proofreader  [facility]  Office  [phone]  CC: Primary care physician; Venia Carbon, MD   Note: This dictation was prepared with Dragon dictation along with smaller phrase technology. Any transcriptional errors that result from this process are unintentional.

## 2019-03-02 NOTE — ED Notes (Signed)
Hospital bed provided for pt to ensure comfort. Pt tolerated transfer well and very appreciative.

## 2019-03-02 NOTE — ED Notes (Signed)
Dr. Alfred Levins at the bedside to discuss concerns regarding pt ability to go home safety and ambulate without assistance. Pt c/o continued dizziness and feeling unsteady upon standing. Pt verbalized understanding risks of going home while still unsteady ambulating.

## 2019-03-02 NOTE — Evaluation (Addendum)
Physical Therapy Evaluation Patient Details Name: Keith Hall MRN: [id number] DOB: [birth year] Today's Date: 03/02/2019   History of Present Illness  Keith Hall is a 83yoM who comes to [facility] on 9/14 with CC vertigo, fatigue, and N/V. Pt reports upon waking 9/14 having room spinning phenomenon x10 minutes, reportedly resolved while sitting EOB, then unsteady gait down to eat breakfast. Immediately after eating pt felt 'worn out' and decided to lay down and take a nap until 1:00pm. Up awakening from nap, reports more room spinning dizziness, with unsteady gait, now nauseated and vomitting all of which persisted well after arrival to ED. 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 Pt was active with [facility] OPP[facility] PTA 2/2 hip pain.  Clinical Impression  Pt admitted with above diagnosis. Pt currently with functional limitations due to the deficits listed below (see "PT Problem List"). Upon entry, pt in bed, awake and agreeable to participate. The pt is alert and oriented x4, pleasant, conversational, and generally a good historian, but HOH and hearing aides are not on site. Pt reports dizziness, vertigo, and N/V have since resolved with medications, no reportign some interest in eating, but reports his instability and lack of balance in standing remain acutely impaired. 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 Eye exam revealing for Right beating nystagmus worse with right gaze at ~2hz , but also present with Left gaze. Transfers and AMB require physical assistance and pt sustains multiple LOB in session unable to self correct. Author returned after pt finished eating to allow for longer distance AMB, pt able to cover >363ft with SPC, he and wife report gait is near baseline, however  pt continues to have 3-4 LOB with [facility] which is not typical. Functional mobility assessment demonstrates increased effort/time requirements, poor tolerance, and absolute need for physical assistance, whereas the patient performed these at a higher level of independence PTA. Examination and HPI both warrant for acute cerebellar process, however baseline spastic hemiplegia limits standardization of finger-to-nose testing, rapid alternating movements, and gait assessment, hence interpretation is difficult at this time. Presentation is not consistent with typical BPPV presentation in which vertiginous episodes are <60sec in duration and positionally dependent. Pt has a hospital bed on first floor and WC, likely would be safe for DC to home if he has supervision with AMB. Pt will benefit from skilled PT intervention to increase independence and safety with basic mobility in preparation for discharge to the venue listed below.       Follow Up Recommendations Outpatient PT;Supervision for mobility/OOB    Equipment Recommendations  None recommended by PT    Recommendations for Other Services       Precautions / Restrictions Precautions Precautions: Fall Restrictions Weight Bearing Restrictions: No      Mobility  Bed Mobility Overal bed mobility: Modified Independent             General bed mobility comments: additional time/effort needed.  Transfers Overall transfer level: Needs assistance Equipment used: 1 person hand held assist Transfers: Sit to/from Stand Sit to Stand: Min assist;Mod assist         General transfer comment: modA to rise and minA intermittently for instability in static standing.  Ambulation/Gait Ambulation/Gait assistance: Min assist Gait Distance (Feet): 350 Feet(64ft Hall HHA; then AMB with  SPC) Assistive device: 1 person hand held assist;Straight cane Gait Pattern/deviations: (Left rigid straight leg swing phase with mild abducted limb and 30-40 degrees  toe-out progression angle. Right foot striking scuff without patent heel contact with floor, mild toe walking on Right with anterior weight shift.)     General Gait Details: chronic gait accomodations in the setting of LLE spastic hemiplegia and RUE SPC use. No blatant wide-based gait, but pt has several LOB during short distance trial in room; 3-4 LOB with SPC, pt reports as not typicla of baseline.  Stairs            Wheelchair Mobility    Modified Rankin (Stroke Patients Only)       Balance Overall balance assessment: Needs assistance Sitting-balance support: Single extremity supported Sitting balance-Leahy Scale: Good     Standing balance support: Single extremity supported;During functional activity Standing balance-Leahy Scale: Zero                               Pertinent Vitals/Pain Pain Assessment: No/denies pain    Home Living Family/patient expects to be discharged to:: Private residence Living Arrangements: Spouse/significant other Available Help at Discharge: Family Type of Home: House Home Access: Stairs to enter     Home Layout: Two level;Bed/bath upstairs Home Equipment: Hospital bed;Cane - single point;Shower seat;Walker - 2 wheels;Walker - 4 wheels;Wheelchair - manual      Prior Function Level of Independence: Independent with assistive device(s);Needs assistance      ADL's / Homemaking Assistance Needed: independent Hall ADL, IADL; driviing sef to OPPT in [place]  Comments: [place] tpically; spastic Left hemiplegia so RW use has been nil, but inittially required a strap on left hand 5 years ago     Hand Dominance   Dominant Hand: Right    Extremity/Trunk Assessment   Upper Extremity Assessment Upper Extremity Assessment: LUE deficits/detail LUE Deficits / Details: Chronic spastic weakness; (+) dysmetria with proximal and distal targets (distal is worse), but limb is assisted proximally during testing d/t baseline deficits.     Lower Extremity Assessment Lower Extremity Assessment: LLE deficits/detail LLE Deficits / Details: baseline weakness and spasticity, not examined in detail this date.       Communication   Communication: No difficulties;HOH  Cognition Arousal/Alertness: Awake/alert Behavior During Therapy: WFL for tasks assessed/performed Overall Cognitive Status: Within Functional Limits for tasks assessed                                        General Comments      Exercises     Assessment/Plan    PT Assessment Patient needs continued PT services  PT Problem List Decreased balance;Decreased mobility;Decreased coordination       PT Treatment Interventions DME instruction;Balance training;Gait training;Functional mobility training;Stair training;Therapeutic activities;Therapeutic exercise;Patient/family education    PT Goals (Current goals can be found in the Care Plan section)  Acute Rehab PT Goals Patient Stated Goal: Return to home with basline independence PT Goal Formulation: With patient Time For Goal Achievement: 03/16/19 Potential to Achieve Goals: Fair    Frequency 7X/week   Barriers to discharge        Co-evaluation               AM-PAC PT "6 Clicks" Mobility  Outcome Measure Help needed turning from your back to your side while in  a flat bed without using bedrails?: None Help needed moving from lying on your back to sitting on the side of a flat bed without using bedrails?: None Help needed moving to and from a bed to a chair (including a wheelchair)?: A Little Help needed standing up from a chair using your arms (e.g., wheelchair or bedside chair)?: A Little Help needed to walk in hospital room?: A Little Help needed climbing 3-5 steps with a railing? : A Lot 6 Click Score: 19    End of Session Equipment Utilized During Treatment: Gait belt Activity Tolerance: Patient tolerated treatment well Patient left: in chair;with family/visitor  present;with call bell/phone within reach;with chair alarm set Nurse Communication: Mobility status PT Visit Diagnosis: Unsteadiness on feet (R26.81);Difficulty in walking, not elsewhere classified (R26.2);Dizziness and giddiness (R42);Ataxic gait (R26.0)    Time: 1245-1330 PT Time Calculation (min) (ACUTE ONLY): 45 min   Charges:   PT Evaluation $PT Eval Moderate Complexity: 1 Mod PT Treatments $Neuromuscular Re-education: 8-22 mins        2:07 PM, 03/02/19 Keith Hall, PT, DPT Physical Therapist - [facility]  [phone] ([place])   Keith Hall 03/02/2019, 2:07 PM

## 2019-03-02 NOTE — ED Notes (Signed)
Pt attempted to ambulate at this time. Pt dizzy when standing from bed. Pt gait unsteady. Pt states "I normally use a cane at home". Pt stating "I want to go home I want to get out of this place". This RN explained to pt that Doctor Alfred Levins was concerned that it may not be safe to discharge pt home with change in ambulation status. MD Alfred Levins aware and at bedside to discuss careplan with pt at this time.

## 2019-03-02 NOTE — Care Management Obs Status (Signed)
[place] NOTIFICATION   Patient Details  Name: Keith Hall MRN: [id number] Date of Birth: [birth year]   Medicare Observation Status Notification Given:  Yes    Shelbie Hutching, RN 03/02/2019, 3:14 PM

## 2019-03-02 NOTE — ED Notes (Signed)
Pt provided with a breakfast tray, hand sanitizer provided to patient at this time, pt sat up in bed to eat. Denies any needs. Will continue to monitor for further patient needs.

## 2019-03-02 NOTE — Consult Note (Signed)
Reason for Consult: cerrebellar sign Referring Physician: Hospitalist  CC: cerebellar signs  HPI: Keith Hall is an 73 y.o. 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  He had associated nausea and vomiting x5 with no abdominal pain.  He denied any chest pain or dyspnea or palpitations.  He has been having occasional paresthesias in left upper and lower extremity letter not unusual with his CVA.  They have not been worsening though.  He denies any fever or chills.  No headache or diplopia or tinnitus.  No urinary or stool incontinence.  No dysuria, oliguria or hematuria or flank pain.  Upon presentation to the emergency room, vital signs were normal.  Labs revealed unremarkable CMP and CBC as well as urinalysis.   Head CT scan: old lacunar infarcts in the deep cerebral white matter bilaterally and right basal ganglia and mild chronic microvascular ischemic changes with no acute intracranial abnormalities.    MRI revealed chronic hemorrhagic infarct of the right corona radiata and thalamus with wallerian degeneration since 09-16-14, small vessel disease in both cerebral white matter with no acute intracranial abnormalities.  The patient was given p.o. meclizine twice, p.o. Valium, Phenergan and 1 L bolus of IV normal saline.   Neurology called because PT evaluated him and thought she had a dysmetria on [place].  Patient baseline: per wife at bedside: she had hgic storke in Sep 15, 2013, released to rehab , he regained strenght and was b ale to perform his ADLS with minimal help but since 16-Sep-2022 he has been declining  Now using cane and unsteady on his legs. Wife also state that shacking and dysmetria on the [place] has been her for a while.  No HA, no visual disturbance, no LOC  Past  Medical History:  Diagnosis Date  . BPH (benign prostatic hypertrophy)   . Depression 1980'sApr 01, 2004   MDD---even needed hospitalization  . Intraparenchymal hemorrhage of brain ([place]) 12/15   Right basal ganglia--Rx at [facility] and rehab  . Kidney stone 1980 only  . Osteoarthritis of both knees   . Stroke ([place])   . Sudden cardiac death [facility]) 1999/09/16   no clear etiology of this    Past Surgical History:  Procedure Laterality Date  . CARDIAC CATHETERIZATION  last in 09/16/07   insignificant coronary disease  . CARDIOVASCULAR STRESS TEST  07/09/11   Negative--Dr Paraschos  . HERNIA REPAIR  09-16-2010   left inguinal--Dr Byrnett  . ROTATOR CUFF REPAIR  September 16, 2007   both done by Dr Margaretmary Eddy  . TOTAL KNEE ARTHROPLASTY Right 2/14   Dr Marry Guan  . TOTAL KNEE ARTHROPLASTY Left 1/15   Dr [facility]    Family History  Problem Relation Age of Onset  . Stroke Mother   . COPD Sister   . Diabetes Son        Type 1  . Heart disease Neg Hx   . Hypertension Neg Hx     Social History:  reports that he has never smoked. He has never used smokeless tobacco. He reports that he does not drink alcohol or use drugs.  Allergies  Allergen Reactions  . Atorvastatin Other (See Comments)    Elevated LFTs  . Penicillins Hives, Swelling and Rash    Did it involve swelling of the face/tongue/throat, SOB, or low BP? Yes Did it  involve sudden or severe rash/hives, skin peeling, or any reaction on the inside of your mouth or nose? Yes Did you need to seek medical attention at a hospital or doctor's office? Yes When did it last happen?15 years If all above answers are "NO", may proceed with cephalosporin use.    Medications:   ROS: *as per hpi Physical Examination: Blood pressure 118/65, pulse 74, temperature 98.7 F (37.1 C), temperature source Oral, resp. rate 18, height 5\' 8"  (1.727 m), weight 94.6 kg, SpO2 96 %.  Neurologic Examination Alert, awake, oriented x4, speech nle, follows commands PERLA< EOMI,  end gaze nystagmus on the right, ? 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 He has dysmetria on finger to nose on the left ( per wife this is not new and has been there for for long time Gait not asessed at time of examination  Results for orders placed or performed during the hospital encounter of 03/01/19 (from the past 48 hour(s))  Glucose, capillary     Status: Abnormal   Collection Time: 03/01/19  3:31 PM  Result Value Ref Range   Glucose-Capillary 106 (H) 70 - 99 mg/dL  Urinalysis, Complete w Microscopic     Status: Abnormal   Collection Time: 03/01/19  3:45 PM  Result Value Ref Range   Color, Urine YELLOW (A) YELLOW   APPearance CLEAR (A) CLEAR   Specific Gravity, Urine 1.009 1.005 - 1.030   pH 6.0 5.0 - 8.0   Glucose, UA NEGATIVE NEGATIVE mg/dL   Hgb urine dipstick NEGATIVE NEGATIVE   Bilirubin Urine NEGATIVE NEGATIVE   Ketones, ur 5 (A) NEGATIVE mg/dL   Protein, ur NEGATIVE NEGATIVE mg/dL   Nitrite NEGATIVE NEGATIVE   Leukocytes,Ua NEGATIVE NEGATIVE   RBC / HPF 0-5 0 - 5 RBC/hpf   WBC, UA 0-5 0 - 5 WBC/hpf   Bacteria, UA NONE SEEN NONE SEEN   Squamous Epithelial / LPF NONE SEEN 0 - 5    Comment: Performed at [facility], [address] [place], [place] [address]  CBC     Status: None   Collection Time: 03/01/19  3:45 PM  Result Value Ref Range   WBC 5.9 4.0 - 10.5 K/uL   RBC 4.80 4.22 - 5.81 MIL/uL   Hemoglobin 14.8 13.0 - 17.0 g/dL   HCT 43.7 39.0 - 52.0 %   MCV 91.0 80.0 - 100.0 fL   MCH 30.8 26.0 - 34.0 pg   MCHC 33.9 30.0 - 36.0 g/dL   RDW 12.6 11.5 - 15.5 %   Platelets 159 150 - 400 K/uL   nRBC 0.0 0.0 - 0.2 %    Comment: Performed at [facility], [address] [place], [place] [address]  Comprehensive metabolic panel     Status: Abnormal   Collection Time: 03/01/19  3:45 PM  Result Value Ref Range   Sodium 137 135 - 145  mmol/L   Potassium 4.1 3.5 - 5.1 mmol/L   Chloride 103 98 - 111 mmol/L   CO2 24 22 - 32 mmol/L   Glucose, Bld 122 (H) 70 - 99 mg/dL   BUN 15 8 - 23 mg/dL   Creatinine, Ser 0.92 0.61 - 1.24 mg/dL   Calcium 9.5 8.9 - 10.3 mg/dL   Total Protein 7.2 6.5 - 8.1 g/dL   Albumin 4.5 3.5 - 5.0 g/dL   AST 32 15 - 41  U/L   ALT 30 0 - 44 U/L   Alkaline Phosphatase 61 38 - 126 U/L   Total Bilirubin 1.1 0.3 - 1.2 mg/dL   GFR calc non Af Amer >60 >60 mL/min   GFR calc Af Amer >60 >60 mL/min   Anion gap 10 5 - 15    Comment: Performed at [facility], [address] [place], [place] [address]  SARS Coronavirus 2 [facility] order, Performed in [facility] hospital lab) Nasopharyngeal Nasopharyngeal Swab     Status: None   Collection Time: 03/02/19  3:07 AM   Specimen: Nasopharyngeal Swab  Result Value Ref Range   SARS Coronavirus 2 NEGATIVE NEGATIVE    Comment: (NOTE) If result is NEGATIVE SARS-CoV-2 target nucleic acids are NOT DETECTED. The SARS-CoV-2 RNA is generally detectable in upper and lower  respiratory specimens during the acute phase of infection. The lowest  concentration of SARS-CoV-2 viral copies this assay can detect is 250  copies / mL. A negative result does not preclude SARS-CoV-2 infection  and should not be used as the sole basis for treatment or other  patient management decisions.  A negative result may occur with  improper specimen collection / handling, submission of specimen other  than nasopharyngeal swab, presence of viral mutation(s) within the  areas targeted by this assay, and inadequate number of viral copies  (<250 copies / mL). A negative result must be combined with clinical  observations, patient history, and epidemiological information. If result is POSITIVE SARS-CoV-2 target nucleic acids are DETECTED. The SARS-CoV-2 RNA is generally detectable in upper and lower  respiratory specimens dur ing the acute phase of infection.  Positive  results are  indicative of active infection with SARS-CoV-2.  Clinical  correlation with patient history and other diagnostic information is  necessary to determine patient infection status.  Positive results do  not rule out bacterial infection or co-infection with other viruses. If result is PRESUMPTIVE POSTIVE SARS-CoV-2 nucleic acids MAY BE PRESENT.   A presumptive positive result was obtained on the submitted specimen  and confirmed on repeat testing.  While 2019 novel coronavirus  (SARS-CoV-2) nucleic acids may be present in the submitted sample  additional confirmatory testing may be necessary for epidemiological  and / or clinical management purposes  to differentiate between  SARS-CoV-2 and other Sarbecovirus currently known to infect humans.  If clinically indicated additional testing with an alternate test  methodology [phone]) is advised. The SARS-CoV-2 RNA is generally  detectable in upper and lower respiratory sp ecimens during the acute  phase of infection. The expected result is Negative. Fact Sheet for Patients:  StrictlyIdeas.no Fact Sheet for Healthcare Providers: BankingDealers.co.za This test is not yet approved or cleared by the [place] FDA and has been authorized for detection and/or diagnosis of SARS-CoV-2 by FDA under an Emergency Use Authorization (EUA).  This EUA will remain in effect (meaning this test can be used) for the duration of the COVID-19 declaration under Section 564(b)(1) of the Act, 21 U.S.C. section 360bbb-3(b)(1), unless the authorization is terminated or revoked sooner. Performed at [facility], [address] [place], [place] [id number]   Basic metabolic panel     Status: Abnormal   Collection Time: 03/02/19  4:56 AM  Result Value Ref Range   Sodium 141 135 - 145 mmol/L   Potassium 4.1 3.5 - 5.1 mmol/L   Chloride 108 98 - 111 mmol/L   CO2 25 22 - 32 mmol/L   Glucose, Bld 123 (  H) 70 -  99 mg/dL   BUN 15 8 - 23 mg/dL   Creatinine, Ser 0.85 0.61 - 1.24 mg/dL   Calcium 9.1 8.9 - 10.3 mg/dL   GFR calc non Af Amer >60 >60 mL/min   GFR calc Af Amer >60 >60 mL/min   Anion gap 8 5 - 15    Comment: Performed at [facility], [address] [place], [place] [address]  CBC     Status: Abnormal   Collection Time: 03/02/19  4:56 AM  Result Value Ref Range   WBC 11.7 (H) 4.0 - 10.5 K/uL   RBC 4.45 4.22 - 5.81 MIL/uL   Hemoglobin 13.6 13.0 - 17.0 g/dL   HCT 40.2 39.0 - 52.0 %   MCV 90.3 80.0 - 100.0 fL   MCH 30.6 26.0 - 34.0 pg   MCHC 33.8 30.0 - 36.0 g/dL   RDW 12.6 11.5 - 15.5 %   Platelets 135 (L) 150 - 400 K/uL   nRBC 0.0 0.0 - 0.2 %    Comment: Performed at [facility], [address] [place], [place] [address]    Recent Results (from the past 240 hour(s))  SARS Coronavirus 2 [facility] order, Performed in [facility] hospital lab) Nasopharyngeal Nasopharyngeal Swab     Status: None   Collection Time: 03/02/19  3:07 AM   Specimen: Nasopharyngeal Swab  Result Value Ref Range Status   SARS Coronavirus 2 NEGATIVE NEGATIVE Final    Comment: (NOTE) If result is NEGATIVE SARS-CoV-2 target nucleic acids are NOT DETECTED. The SARS-CoV-2 RNA is generally detectable in upper and lower  respiratory specimens during the acute phase of infection. The lowest  concentration of SARS-CoV-2 viral copies this assay can detect is 250  copies / mL. A negative result does not preclude SARS-CoV-2 infection  and should not be used as the sole basis for treatment or other  patient management decisions.  A negative result may occur with  improper specimen collection / handling, submission of specimen other  than nasopharyngeal swab, presence of viral mutation(s) within the  areas targeted by this assay, and inadequate number of viral copies  (<250 copies / mL). A negative result must be combined with clinical  observations, patient history, and epidemiological  information. If result is POSITIVE SARS-CoV-2 target nucleic acids are DETECTED. The SARS-CoV-2 RNA is generally detectable in upper and lower  respiratory specimens dur ing the acute phase of infection.  Positive  results are indicative of active infection with SARS-CoV-2.  Clinical  correlation with patient history and other diagnostic information is  necessary to determine patient infection status.  Positive results do  not rule out bacterial infection or co-infection with other viruses. If result is PRESUMPTIVE POSTIVE SARS-CoV-2 nucleic acids MAY BE PRESENT.   A presumptive positive result was obtained on the submitted specimen  and confirmed on repeat testing.  While 2019 novel coronavirus  (SARS-CoV-2) nucleic acids may be present in the submitted sample  additional confirmatory testing may be necessary for epidemiological  and / or clinical management purposes  to differentiate between  SARS-CoV-2 and other Sarbecovirus currently known to infect humans.  If clinically indicated additional testing with an alternate test  methodology [phone]) is advised. The SARS-CoV-2 RNA is generally  detectable in upper and lower respiratory sp ecimens during the acute  phase of infection. The expected result is Negative. Fact Sheet for Patients:  StrictlyIdeas.no Fact Sheet for Healthcare Providers: BankingDealers.co.za This test is not yet approved or  cleared by the [place] and has been authorized for detection and/or diagnosis of SARS-CoV-2 by FDA under an Emergency Use Authorization (EUA).  This EUA will remain in effect (meaning this test can be used) for the duration of the COVID-19 declaration under Section 564(b)(1) of the Act, 21 U.S.C. section 360bbb-3(b)(1), unless the authorization is terminated or revoked sooner. Performed at [facility], [place], [place] [address]     Ct Head Wo  Contrast  Result Date: 03/01/2019 CLINICAL DATA:  Per nurse note: pt reports he woke up this morning at 0930 and felt like he was very dizzy and off balance when walking. Pt reports having difficulty standing because feeling weak all over, not on one side. Also having nausea. Pt reports went to bed at 1100 last night but did not fall asleep until about 1 am. Pt felt normal when went to bed. No slurred speech. History of hemorrhagic stroke. Grip strength is equal. Pt keeps stating "I don't feel coordinated at all and that is highly unusual". EXAM: CT HEAD WITHOUT CONTRAST TECHNIQUE: Contiguous axial images were obtained from the base of the skull through the vertex without intravenous contrast. COMPARISON:  07/22/2018 and older exams. FINDINGS: Brain: No evidence of acute infarction, hemorrhage, hydrocephalus, extra-axial collection or mass lesion/mass effect. There are well-defined areas of hypoattenuation in the deep cerebral white matter bilaterally and right basal ganglia consistent with old lacunar infarcts. Patchy white matter hypoattenuation is also noted consistent with mild chronic microvascular ischemic change. Vascular: No hyperdense vessel or unexpected calcification. Skull: Normal. Negative for fracture or focal lesion. Sinuses/Orbits: Visualized globes and orbits are unremarkable. The visualized sinuses and mastoid air cells are clear. Other: None. IMPRESSION: 1. No acute intracranial abnormalities. 2. Old lacunar infarcts and mild chronic microvascular ischemic change stable from the prior study. Electronically Signed   By: Lajean Manes M.D.   On: 03/01/2019 16:06   Mr Brain Wo Contrast  Result Date: 03/01/2019 CLINICAL DATA:  73 year old male with dizziness. EXAM: MRI HEAD WITHOUT CONTRAST TECHNIQUE: Multiplanar, multiecho pulse sequences of the brain and surrounding structures were obtained without intravenous contrast. COMPARISON:  Head CT without contrast earlier today. Brain MRI 07/05/2014.  FINDINGS: Brain: No restricted diffusion or evidence of acute infarction. Chronic hemorrhagic infarct of the posterior right corona radiata and thalamus with expected evolution since 2016. Subsequent brainstem Wallerian degeneration has developed. Progressed bilateral cerebral white matter T2 and FLAIR hyperintensity since 2016, now confluent in both hemispheres. Bilateral deep white matter capsule involvement. No midline shift, mass effect, evidence of mass lesion, ventriculomegaly, extra-axial collection or acute intracranial hemorrhage. Cervicomedullary junction and pituitary are within normal limits. Vascular: Major intracranial vascular flow voids are stable since 2016. Skull and upper cervical spine: Negative for age visible cervical spine. Normal bone marrow signal. Sinuses/Orbits: Stable and negative. Other: Mastoids remain clear. Visible internal auditory structures appear stable and within normal limits. Scalp and face soft tissues appear negative. IMPRESSION: 1. No acute intracranial abnormality. 2. Chronic hemorrhagic infarct of the right corona radiata and thalamus with Wallerian degeneration since 2016. 3. Progressed signal abnormality in the bilateral cerebral white matter since 2016, favor small vessel disease related. Electronically Signed   By: Genevie Ann M.D.   On: 03/01/2019 23:32     Assessment/Plan:  73 y/o with with a known history of hgic stroke in 2015 with left-sided weakness, depression/anxiety, coronary artery disease, hypertension and carotid artery disease, admitted with dizziness. MRI with old hgic infarct. Neuro exam  with left spastic hemiparesis and dysmetria on FN on the left ( old finding). I cannot appreciate any new cerebellar findings at least at the time of examination. Continue neuroprotective measures inclduing normothermia, normoglycemia, correct electrolytes/metabolic abnlities.  Continue neuro,omotoring No neurological intervention for the time being

## 2019-03-02 NOTE — ED Notes (Signed)
Assisted pt with use of urinal. Pt tolerated well. Continues to experience dizziness upon standing. Provided for comfort and safety and will continue to assess.

## 2019-03-02 NOTE — ED Notes (Signed)
Pt resting quietly on stretcher with lights off to enhance rest. No increased work of breathing or acute distress noted at this time.

## 2019-03-03 DIAGNOSIS — R270 Ataxia, unspecified: Secondary | ICD-10-CM | POA: Diagnosis not present

## 2019-03-03 DIAGNOSIS — F419 Anxiety disorder, unspecified: Secondary | ICD-10-CM | POA: Diagnosis not present

## 2019-03-03 DIAGNOSIS — R42 Dizziness and giddiness: Secondary | ICD-10-CM | POA: Diagnosis not present

## 2019-03-03 DIAGNOSIS — I2510 Atherosclerotic heart disease of native coronary artery without angina pectoris: Secondary | ICD-10-CM | POA: Diagnosis not present

## 2019-03-03 MED ORDER — MECLIZINE HCL 25 MG PO TABS: 25.0000 mg | ORAL_TABLET | Freq: Three times a day (TID) | ORAL | 1 refills | Status: DC | PRN

## 2019-03-03 NOTE — Progress Notes (Signed)
Pt being discharged home, discharge instructions reviewed with pt and wife, states understanding, pt with no complaints 

## 2019-03-05 ENCOUNTER — Ambulatory Visit: Payer: PPO | Admitting: Internal Medicine

## 2019-03-05 NOTE — Discharge Summary (Signed)
[place] [facility] [place] NAME: Keith Hall    MR#:  [id number]  DATE OF BIRTH:  12-Jan-1946  DATE OF ADMISSION:  03/01/2019   ADMITTING PHYSICIAN: Christel Mormon, MD  DATE OF DISCHARGE: 03/03/2019 10:11 AM  PRIMARY CARE PHYSICIAN: Venia Carbon, MD   ADMISSION DIAGNOSIS:  Vertigo [R42] DISCHARGE DIAGNOSIS:  Active Problems:   Vertigo  SECONDARY DIAGNOSIS:   Past Medical History:  Diagnosis Date  . BPH (benign prostatic hypertrophy)   . Depression 1980's04-13-04   MDD---even needed hospitalization  . 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 Kidney stone 1980 only  . Osteoarthritis of both knees   . Stroke ([place])   . Sudden cardiac death [facility]) 09/28/99   no clear etiology of this   HOSPITAL COURSE:  73 y/o with with a known history of hgic stroke in Sep 27, 2013 with left-sided weakness, depression/anxiety, coronary artery disease, hypertension and carotid artery disease, admitted with dizziness.   1. Intractable vertigo with associated ataxia. - likely benign positional vertigo - MRI with old hgic infarct. Neuro exam with left spastic hemiparesis and dysmetria on FN on the left ( old finding).   2. Depression with anxiety.  -His Wellbutrin SR, Celexa,Klonopin and PRN Valium  resumed.  3. Coronary artery disease.  -aspirin will be resumed at DC.  4. History of CVA with left-sided hemiparesis. -His muscle strength is almost equal on both sides. -He will be placed on aspirin as mentioned above.  DISCHARGE CONDITIONS:  stable CONSULTS OBTAINED:  Treatment Team:  Neville Route, MD DRUG ALLERGIES:   Allergies  Allergen Reactions  . Atorvastatin Other (See Comments)    Elevated LFTs  . Penicillins Hives, Swelling and Rash    Did it involve swelling of the face/tongue/throat, SOB, or low BP? Yes Did it involve sudden or severe rash/hives, skin peeling, or any  reaction on the inside of your mouth or nose? Yes Did you need to seek medical attention at a hospital or doctor's office? Yes When did it last happen?15 years If all above answers are "NO", may proceed with cephalosporin use.   DISCHARGE MEDICATIONS:   Allergies as of 03/03/2019      Reactions   Atorvastatin Other (See Comments)   Elevated LFTs   Penicillins Hives, Swelling, Rash   Did it involve swelling of the face/tongue/throat, SOB, or low BP? Yes Did it involve sudden or severe rash/hives, skin peeling, or any reaction on the inside of your mouth or nose? Yes Did you need to seek medical attention at a hospital or doctor's office? Yes When did it last happen?15 years If all above answers are "NO", may proceed with cephalosporin use.      Medication List    STOP taking these medications   predniSONE 20 MG tablet Commonly known as: DELTASONE     TAKE these medications   acetaminophen 500 MG tablet Commonly known as: TYLENOL Take 500 mg by mouth 3 (three) times daily as needed.   aspirin 81 MG chewable tablet Chew 81 mg by mouth daily.   buPROPion 150 MG 12 hr tablet Commonly known as: WELLBUTRIN SR TAKE 1 TABLET (150 MG TOTAL) BY MOUTH 2 (TWO) TIMES DAILY EVERY MORNING/AFTER LUNCH   citalopram 20 MG tablet Commonly known as: CELEXA TAKE 1 TABLET (20 MG TOTAL) BY MOUTH DAILY.   clonazePAM 0.5 MG tablet Commonly known as: KLONOPIN Take 0.5 mg  by mouth daily.   meclizine 25 MG tablet Commonly known as: ANTIVERT Take 1 tablet (25 mg total) by mouth 3 (three) times daily as needed for dizziness or nausea.   methylphenidate 5 MG tablet Commonly known as: RITALIN Take 1 tablet (5 mg total) by mouth 2 (two) times daily. With breakfast and lunch   MOVE FREE PO Take 2 tablets by mouth daily.   multivitamin tablet Take 2 tablets by mouth daily.        DISCHARGE INSTRUCTIONS:   DIET:  Cardiac diet DISCHARGE CONDITION:  Good ACTIVITY:  Activity  as tolerated OXYGEN:  Home Oxygen: No.  Oxygen Delivery: room air DISCHARGE LOCATION:  home   If you experience worsening of your admission symptoms, develop shortness of breath, life threatening emergency, suicidal or homicidal thoughts you must seek medical attention immediately by calling 911 or calling your MD immediately  if symptoms less severe.  You Must read complete instructions/literature along with all the possible adverse reactions/side effects for all the Medicines you take and that have been prescribed to you. Take any new Medicines after you have completely understood and accpet all the possible adverse reactions/side effects.   Please note  You were cared for by a hospitalist during your hospital stay. If you have any questions about your discharge medications or the care you received while you were in the hospital after you are discharged, you can call the unit and asked to speak with the hospitalist on call if the hospitalist that took care of you is not available. Once you are discharged, your primary care physician will handle any further medical issues. Please note that NO REFILLS for any discharge medications will be authorized once you are discharged, as it is imperative that you return to your primary care physician (or establish a relationship with a primary care physician if you do not have one) for your aftercare needs so that they can reassess your need for medications and monitor your lab values.    On the day of Discharge:  VITAL SIGNS:  Blood pressure 121/69, pulse 70, temperature 97.9 F (36.6 C), temperature source Oral, resp. rate 18, height 5\' 8"  (1.727 m), weight 94.6 kg, SpO2 96 %. PHYSICAL EXAMINATION:  GENERAL:  73 y.o.-year-old patient lying in the bed with no acute distress.  EYES: Pupils equal, round, reactive to light and accommodation. No scleral icterus. Extraocular muscles intact.  HEENT: Head atraumatic, normocephalic. Oropharynx and nasopharynx  clear.  NECK:  Supple, no jugular venous distention. No thyroid enlargement, no tenderness.  LUNGS: Normal breath sounds bilaterally, no wheezing, rales,rhonchi or crepitation. No use of accessory muscles of respiration.  CARDIOVASCULAR: S1, S2 normal. No murmurs, rubs, or gallops.  ABDOMEN: Soft, non-tender, non-distended. Bowel sounds present. No organomegaly or mass.  EXTREMITIES: No pedal edema, cyanosis, or clubbing.  NEUROLOGIC: Cranial nerves II through XII are intact. Muscle strength 5/5 in all extremities. Sensation intact. Gait not checked.  PSYCHIATRIC: The patient is alert and oriented x 3.  SKIN: No obvious rash, lesion, or ulcer.  DATA REVIEW:   CBC Recent Labs  Lab 03/02/19 0456  WBC 11.7*  HGB 13.6  HCT 40.2  PLT 135*    Chemistries  Recent Labs  Lab 03/01/19 1545 03/02/19 0456  NA 137 141  K 4.1 4.1  CL 103 108  CO2 24 25  GLUCOSE 122* 123*  BUN 15 15  CREATININE 0.92 0.85  CALCIUM 9.5 9.1  AST 32  --   ALT 30  --  ALKPHOS 61  --   BILITOT 1.1  --      Follow-up Information    Venia Carbon, MD. Go on 03/05/2019.   Specialties: Internal Medicine, Pediatrics Why: @ 8:am Contact information: [place] [address] [phone]        Margaretha Sheffield, MD. Schedule an appointment as soon as possible for a visit in 1 week.   Specialty: Otolaryngology Why: Office wants patient to make own appt. Contact information: [place]. [place] [address] [phone]           Management plans discussed with the patient, family and they are in agreement.  CODE STATUS: Prior   TOTAL TIME TAKING CARE OF THIS PATIENT: 45 minutes.    Max Sane M.D on 03/05/2019 at 10:03 PM  Between 7am to 6pm - Pager - [phone]  After 6pm go to [email or web address] - Proofreader  [facility]  Office  [phone]  CC: Primary care physician; Venia Carbon, MD   Note: This  dictation was prepared with Dragon dictation along with smaller phrase technology. Any transcriptional errors that result from this process are unintentional.

## 2019-03-10 ENCOUNTER — Telehealth: Payer: Self-pay

## 2019-03-10 DIAGNOSIS — M65331 Trigger finger, right middle finger: Secondary | ICD-10-CM | POA: Diagnosis not present

## 2019-03-10 DIAGNOSIS — M79641 Pain in right hand: Secondary | ICD-10-CM | POA: Diagnosis not present

## 2019-03-10 NOTE — Telephone Encounter (Signed)
Left message to follow up with patient on how he is doing since last hospital visit. Patient does have an appointment with Dr Silvio Pate to follow up on 03/12/2019. I am not going to be here this afternoon so I am routing this to [facility] and [facility] [place] in case patient call back later today.

## 2019-03-12 ENCOUNTER — Encounter: Payer: Self-pay | Admitting: Internal Medicine

## 2019-03-12 ENCOUNTER — Other Ambulatory Visit: Payer: Self-pay

## 2019-03-12 ENCOUNTER — Ambulatory Visit (INDEPENDENT_AMBULATORY_CARE_PROVIDER_SITE_OTHER): Payer: PPO | Admitting: Internal Medicine

## 2019-03-12 DIAGNOSIS — R42 Dizziness and giddiness: Secondary | ICD-10-CM

## 2019-03-12 DIAGNOSIS — S51812A Laceration without foreign body of left forearm, initial encounter: Secondary | ICD-10-CM | POA: Diagnosis not present

## 2019-03-12 DIAGNOSIS — Z23 Encounter for immunization: Secondary | ICD-10-CM

## 2019-03-12 DIAGNOSIS — G479 Sleep disorder, unspecified: Secondary | ICD-10-CM | POA: Diagnosis not present

## 2019-03-12 MED ORDER — MECLIZINE HCL 25 MG PO TABS: 25.0000 mg | ORAL_TABLET | Freq: Three times a day (TID) | ORAL | 3 refills | Status: DC | PRN

## 2019-03-12 NOTE — Addendum Note (Signed)
Addended by: Pilar Grammes on: 03/12/2019 02:14 PM   Modules accepted: Orders

## 2019-03-12 NOTE — Progress Notes (Signed)
Subjective:    Patient ID: Keith Hall, male    DOB: 1946-06-16, 73 y.o.   MRN: [id number]  HPI Here with wife for hospital follow up Reviewed hospital records  Awoke dizzy with bad spinning Stayed in bed but didn't improve Tried to get up---but didn't improve Unstable walking and then got nauseated Then to ER----some projectile vomiting then MRI was negative for new stroke But then started with projectile vomiting  Hospitalized for 2 days Vertigo has persisted since discharge Fell last week after he got out--scraped up left arm  Taking meclizine three times a day--is helping No symptoms in bed now Still taking it very slow when he stands  Current Outpatient Medications on File Prior to Visit  Medication Sig Dispense Refill  . acetaminophen (TYLENOL) 500 MG tablet Take 500 mg by mouth 3 (three) times daily as needed.     Marland Kitchen aspirin 81 MG chewable tablet Chew 81 mg by mouth daily.    Marland Kitchen buPROPion (WELLBUTRIN SR) 150 MG 12 hr tablet TAKE 1 TABLET (150 MG TOTAL) BY MOUTH 2 (TWO) TIMES DAILY EVERY MORNING/AFTER LUNCH 180 tablet 3  . citalopram (CELEXA) 20 MG tablet TAKE 1 TABLET (20 MG TOTAL) BY MOUTH DAILY. 90 tablet 3  . Glucosamine-Chondroitin (MOVE FREE PO) Take 2 tablets by mouth daily.    . meclizine (ANTIVERT) 25 MG tablet Take 1 tablet (25 mg total) by mouth 3 (three) times daily as needed for dizziness or nausea. 30 tablet 1  . Multiple Vitamin (MULTIVITAMIN) tablet Take 2 tablets by mouth daily.      No current facility-administered medications on file prior to visit.     Allergies  Allergen Reactions  . Atorvastatin Other (See Comments)    Elevated LFTs  . Penicillins Hives, Swelling and Rash    Did it involve swelling of the face/tongue/throat, SOB, or low BP? Yes Did it involve sudden or severe rash/hives, skin peeling, or any reaction on the inside of your mouth or nose? Yes Did you need to seek medical attention at a hospital or doctor's office? Yes When  did it last happen?15 years If all above answers are "NO", may proceed with cephalosporin use.    Past Medical History:  Diagnosis Date  . BPH (benign prostatic hypertrophy)   . Depression 1980's04-22-04   MDD---even needed hospitalization  . 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 Kidney stone 1980 only  . Osteoarthritis of both knees   . Stroke ([place])   . Sudden cardiac death [facility]) 10-07-1999   no clear etiology of this    Past Surgical History:  Procedure Laterality Date  . CARDIAC CATHETERIZATION  last in 10/07/07   insignificant coronary disease  . CARDIOVASCULAR STRESS TEST  07/09/11   Negative--Dr Paraschos  . HERNIA REPAIR  07-Oct-2010   left inguinal--Dr Byrnett  . ROTATOR CUFF REPAIR  10/07/2007   both done by Dr Margaretmary Eddy  . TOTAL KNEE ARTHROPLASTY Right 2/14   Dr Marry Guan  . TOTAL KNEE ARTHROPLASTY Left 1/15   Dr [facility]    Family History  Problem Relation Age of Onset  . Stroke Mother   . COPD Sister   . Diabetes Son        Type 1  . Heart disease Neg Hx   . Hypertension Neg Hx     Social History   Socioeconomic History  . Marital status: Married    Spouse name: Not  on file  . Number of children: 1  . Years of education: Not on file  . Highest education level: Not on file  Occupational History  . Occupation: Outside Oncologist    Comment: Retired at [employer]  . Financial resource strain: Not on file  . Food insecurity    Worry: Not on file    Inability: Not on file  . Transportation needs    Medical: Not on file    Non-medical: Not on file  Tobacco Use  . Smoking status: Never Smoker  . Smokeless tobacco: Never Used  Substance and Sexual Activity  . Alcohol use: No  . Drug use: No  . Sexual activity: Not on file  Lifestyle  . Physical activity    Days per week: Not on file    Minutes per session: Not on file  . Stress: Not on file  Relationships  . Social Product manager on phone: Not on file    Gets together: Not on file    Attends religious service: Not on file    Active member of club or organization: Not on file    Attends meetings of clubs or organizations: Not on file    Relationship status: Not on file  . Intimate partner violence    Fear of current or ex partner: Not on file    Emotionally abused: Not on file    Physically abused: Not on file    Forced sexual activity: Not on file  Other Topics Concern  . Not on file  Social History Narrative   1 son   Has living will   Wife, then son, have health care POA   Would accept resuscitation attempts but no prolonged artificial ventilation   Would not want tube feeds if cognitively unaware   Review of Systems Eating just fine Weight is up some Sleep has not been good Gets some hiccups after eating. Some acid reflux as well No trouble swallowing    Objective:   Physical Exam  Eyes:  Lateral nystagmus with left lateral gaze  Neck: No thyromegaly present.  Cardiovascular: Normal rate and regular rhythm. Exam reveals no gallop.  Gr 2/6 systolic murmur towards the apex  Respiratory: Effort normal and breath sounds normal. No respiratory distress. He has no wheezes. He has no rales.  Lymphadenopathy:    He has no cervical adenopathy.  Neurological:  Antalgic gait ---limp No true ataxia  Skin:  Small laceration with surrounding ecchymoses just distal to left elbow. No infection  Psychiatric: He has a normal mood and affect. His behavior is normal.           Assessment & Plan:

## 2019-03-12 NOTE — Assessment & Plan Note (Signed)
Severe and required hospital observation Doing better now with regular meclizine Discussed weaning when his symptoms are gone Has valium for prn if worsens

## 2019-03-12 NOTE — Assessment & Plan Note (Signed)
More recent Could try the diazepam briefly Also discussed low dose melatonin

## 2019-03-12 NOTE — Assessment & Plan Note (Signed)
Healing okay Will update Td 

## 2019-03-14 MED ORDER — PANTOPRAZOLE SODIUM 40 MG PO TBEC: 40.0000 mg | DELAYED_RELEASE_TABLET | Freq: Every day | ORAL | 3 refills | Status: DC

## 2019-03-14 MED ORDER — DIAZEPAM 2 MG PO TABS: 2.0000 mg | ORAL_TABLET | Freq: Three times a day (TID) | ORAL | 0 refills | Status: DC | PRN

## 2019-03-16 DIAGNOSIS — H8120 Vestibular neuronitis, unspecified ear: Secondary | ICD-10-CM | POA: Diagnosis not present

## 2019-03-16 DIAGNOSIS — J3800 Paralysis of vocal cords and larynx, unspecified: Secondary | ICD-10-CM | POA: Diagnosis not present

## 2019-03-16 DIAGNOSIS — H903 Sensorineural hearing loss, bilateral: Secondary | ICD-10-CM | POA: Diagnosis not present

## 2019-03-30 DIAGNOSIS — R262 Difficulty in walking, not elsewhere classified: Secondary | ICD-10-CM | POA: Diagnosis not present

## 2019-03-30 DIAGNOSIS — R2681 Unsteadiness on feet: Secondary | ICD-10-CM | POA: Diagnosis not present

## 2019-03-30 DIAGNOSIS — R296 Repeated falls: Secondary | ICD-10-CM | POA: Diagnosis not present

## 2019-04-01 DIAGNOSIS — R2681 Unsteadiness on feet: Secondary | ICD-10-CM | POA: Diagnosis not present

## 2019-04-01 DIAGNOSIS — R296 Repeated falls: Secondary | ICD-10-CM | POA: Diagnosis not present

## 2019-04-01 DIAGNOSIS — R262 Difficulty in walking, not elsewhere classified: Secondary | ICD-10-CM | POA: Diagnosis not present

## 2019-04-06 DIAGNOSIS — R296 Repeated falls: Secondary | ICD-10-CM | POA: Diagnosis not present

## 2019-04-06 DIAGNOSIS — R2681 Unsteadiness on feet: Secondary | ICD-10-CM | POA: Diagnosis not present

## 2019-04-06 DIAGNOSIS — R262 Difficulty in walking, not elsewhere classified: Secondary | ICD-10-CM | POA: Diagnosis not present

## 2019-04-07 DIAGNOSIS — M65331 Trigger finger, right middle finger: Secondary | ICD-10-CM | POA: Diagnosis not present

## 2019-04-07 DIAGNOSIS — M79641 Pain in right hand: Secondary | ICD-10-CM | POA: Diagnosis not present

## 2019-04-08 DIAGNOSIS — R296 Repeated falls: Secondary | ICD-10-CM | POA: Diagnosis not present

## 2019-04-08 DIAGNOSIS — R262 Difficulty in walking, not elsewhere classified: Secondary | ICD-10-CM | POA: Diagnosis not present

## 2019-04-08 DIAGNOSIS — R2681 Unsteadiness on feet: Secondary | ICD-10-CM | POA: Diagnosis not present

## 2019-04-13 DIAGNOSIS — R296 Repeated falls: Secondary | ICD-10-CM | POA: Diagnosis not present

## 2019-04-13 DIAGNOSIS — R262 Difficulty in walking, not elsewhere classified: Secondary | ICD-10-CM | POA: Diagnosis not present

## 2019-04-13 DIAGNOSIS — R2681 Unsteadiness on feet: Secondary | ICD-10-CM | POA: Diagnosis not present

## 2019-04-20 DIAGNOSIS — R2681 Unsteadiness on feet: Secondary | ICD-10-CM | POA: Diagnosis not present

## 2019-04-20 DIAGNOSIS — R262 Difficulty in walking, not elsewhere classified: Secondary | ICD-10-CM | POA: Diagnosis not present

## 2019-04-20 DIAGNOSIS — R296 Repeated falls: Secondary | ICD-10-CM | POA: Diagnosis not present

## 2019-04-22 DIAGNOSIS — R296 Repeated falls: Secondary | ICD-10-CM | POA: Diagnosis not present

## 2019-04-22 DIAGNOSIS — R2681 Unsteadiness on feet: Secondary | ICD-10-CM | POA: Diagnosis not present

## 2019-04-22 DIAGNOSIS — R262 Difficulty in walking, not elsewhere classified: Secondary | ICD-10-CM | POA: Diagnosis not present

## 2019-04-27 DIAGNOSIS — R296 Repeated falls: Secondary | ICD-10-CM | POA: Diagnosis not present

## 2019-04-27 DIAGNOSIS — R262 Difficulty in walking, not elsewhere classified: Secondary | ICD-10-CM | POA: Diagnosis not present

## 2019-04-27 DIAGNOSIS — R2681 Unsteadiness on feet: Secondary | ICD-10-CM | POA: Diagnosis not present

## 2019-04-29 DIAGNOSIS — R2681 Unsteadiness on feet: Secondary | ICD-10-CM | POA: Diagnosis not present

## 2019-04-29 DIAGNOSIS — R262 Difficulty in walking, not elsewhere classified: Secondary | ICD-10-CM | POA: Diagnosis not present

## 2019-04-29 DIAGNOSIS — R296 Repeated falls: Secondary | ICD-10-CM | POA: Diagnosis not present

## 2019-05-04 DIAGNOSIS — R2681 Unsteadiness on feet: Secondary | ICD-10-CM | POA: Diagnosis not present

## 2019-05-04 DIAGNOSIS — R296 Repeated falls: Secondary | ICD-10-CM | POA: Diagnosis not present

## 2019-05-04 DIAGNOSIS — R262 Difficulty in walking, not elsewhere classified: Secondary | ICD-10-CM | POA: Diagnosis not present

## 2019-05-11 DIAGNOSIS — R296 Repeated falls: Secondary | ICD-10-CM | POA: Diagnosis not present

## 2019-05-11 DIAGNOSIS — R2681 Unsteadiness on feet: Secondary | ICD-10-CM | POA: Diagnosis not present

## 2019-05-11 DIAGNOSIS — R262 Difficulty in walking, not elsewhere classified: Secondary | ICD-10-CM | POA: Diagnosis not present

## 2019-05-14 DIAGNOSIS — R2681 Unsteadiness on feet: Secondary | ICD-10-CM | POA: Diagnosis not present

## 2019-05-14 DIAGNOSIS — R296 Repeated falls: Secondary | ICD-10-CM | POA: Diagnosis not present

## 2019-05-14 DIAGNOSIS — R262 Difficulty in walking, not elsewhere classified: Secondary | ICD-10-CM | POA: Diagnosis not present

## 2019-05-18 DIAGNOSIS — R2681 Unsteadiness on feet: Secondary | ICD-10-CM | POA: Diagnosis not present

## 2019-05-18 DIAGNOSIS — R296 Repeated falls: Secondary | ICD-10-CM | POA: Diagnosis not present

## 2019-05-18 DIAGNOSIS — R262 Difficulty in walking, not elsewhere classified: Secondary | ICD-10-CM | POA: Diagnosis not present

## 2019-05-20 DIAGNOSIS — R262 Difficulty in walking, not elsewhere classified: Secondary | ICD-10-CM | POA: Diagnosis not present

## 2019-05-20 DIAGNOSIS — R296 Repeated falls: Secondary | ICD-10-CM | POA: Diagnosis not present

## 2019-05-20 DIAGNOSIS — R2681 Unsteadiness on feet: Secondary | ICD-10-CM | POA: Diagnosis not present

## 2019-05-27 DIAGNOSIS — R296 Repeated falls: Secondary | ICD-10-CM | POA: Diagnosis not present

## 2019-05-27 DIAGNOSIS — R262 Difficulty in walking, not elsewhere classified: Secondary | ICD-10-CM | POA: Diagnosis not present

## 2019-05-27 DIAGNOSIS — R2681 Unsteadiness on feet: Secondary | ICD-10-CM | POA: Diagnosis not present

## 2019-06-02 DIAGNOSIS — R2681 Unsteadiness on feet: Secondary | ICD-10-CM | POA: Diagnosis not present

## 2019-06-02 DIAGNOSIS — R296 Repeated falls: Secondary | ICD-10-CM | POA: Diagnosis not present

## 2019-06-02 DIAGNOSIS — R262 Difficulty in walking, not elsewhere classified: Secondary | ICD-10-CM | POA: Diagnosis not present

## 2019-06-04 DIAGNOSIS — R2681 Unsteadiness on feet: Secondary | ICD-10-CM | POA: Diagnosis not present

## 2019-06-04 DIAGNOSIS — R296 Repeated falls: Secondary | ICD-10-CM | POA: Diagnosis not present

## 2019-06-04 DIAGNOSIS — R262 Difficulty in walking, not elsewhere classified: Secondary | ICD-10-CM | POA: Diagnosis not present

## 2019-06-08 ENCOUNTER — Other Ambulatory Visit: Payer: Self-pay | Admitting: Internal Medicine

## 2019-06-15 DIAGNOSIS — R262 Difficulty in walking, not elsewhere classified: Secondary | ICD-10-CM | POA: Diagnosis not present

## 2019-06-15 DIAGNOSIS — R2681 Unsteadiness on feet: Secondary | ICD-10-CM | POA: Diagnosis not present

## 2019-06-15 DIAGNOSIS — R296 Repeated falls: Secondary | ICD-10-CM | POA: Diagnosis not present

## 2019-06-17 DIAGNOSIS — R2681 Unsteadiness on feet: Secondary | ICD-10-CM | POA: Diagnosis not present

## 2019-06-17 DIAGNOSIS — R296 Repeated falls: Secondary | ICD-10-CM | POA: Diagnosis not present

## 2019-06-17 DIAGNOSIS — R262 Difficulty in walking, not elsewhere classified: Secondary | ICD-10-CM | POA: Diagnosis not present

## 2019-06-22 DIAGNOSIS — R296 Repeated falls: Secondary | ICD-10-CM | POA: Diagnosis not present

## 2019-06-22 DIAGNOSIS — R2681 Unsteadiness on feet: Secondary | ICD-10-CM | POA: Diagnosis not present

## 2019-06-22 DIAGNOSIS — R262 Difficulty in walking, not elsewhere classified: Secondary | ICD-10-CM | POA: Diagnosis not present

## 2019-06-24 DIAGNOSIS — R262 Difficulty in walking, not elsewhere classified: Secondary | ICD-10-CM | POA: Diagnosis not present

## 2019-06-24 DIAGNOSIS — R296 Repeated falls: Secondary | ICD-10-CM | POA: Diagnosis not present

## 2019-06-24 DIAGNOSIS — R2681 Unsteadiness on feet: Secondary | ICD-10-CM | POA: Diagnosis not present

## 2019-06-29 DIAGNOSIS — R296 Repeated falls: Secondary | ICD-10-CM | POA: Diagnosis not present

## 2019-06-29 DIAGNOSIS — R2681 Unsteadiness on feet: Secondary | ICD-10-CM | POA: Diagnosis not present

## 2019-06-29 DIAGNOSIS — R262 Difficulty in walking, not elsewhere classified: Secondary | ICD-10-CM | POA: Diagnosis not present

## 2019-07-06 DIAGNOSIS — R262 Difficulty in walking, not elsewhere classified: Secondary | ICD-10-CM | POA: Diagnosis not present

## 2019-07-06 DIAGNOSIS — R296 Repeated falls: Secondary | ICD-10-CM | POA: Diagnosis not present

## 2019-07-06 DIAGNOSIS — R2681 Unsteadiness on feet: Secondary | ICD-10-CM | POA: Diagnosis not present

## 2019-07-08 DIAGNOSIS — R2681 Unsteadiness on feet: Secondary | ICD-10-CM | POA: Diagnosis not present

## 2019-07-08 DIAGNOSIS — R296 Repeated falls: Secondary | ICD-10-CM | POA: Diagnosis not present

## 2019-07-08 DIAGNOSIS — R262 Difficulty in walking, not elsewhere classified: Secondary | ICD-10-CM | POA: Diagnosis not present

## 2019-07-13 DIAGNOSIS — R2681 Unsteadiness on feet: Secondary | ICD-10-CM | POA: Diagnosis not present

## 2019-07-13 DIAGNOSIS — R296 Repeated falls: Secondary | ICD-10-CM | POA: Diagnosis not present

## 2019-07-13 DIAGNOSIS — R262 Difficulty in walking, not elsewhere classified: Secondary | ICD-10-CM | POA: Diagnosis not present

## 2019-07-20 DIAGNOSIS — R296 Repeated falls: Secondary | ICD-10-CM | POA: Diagnosis not present

## 2019-07-20 DIAGNOSIS — R2681 Unsteadiness on feet: Secondary | ICD-10-CM | POA: Diagnosis not present

## 2019-07-20 DIAGNOSIS — R262 Difficulty in walking, not elsewhere classified: Secondary | ICD-10-CM | POA: Diagnosis not present

## 2019-07-22 DIAGNOSIS — R296 Repeated falls: Secondary | ICD-10-CM | POA: Diagnosis not present

## 2019-07-22 DIAGNOSIS — R2681 Unsteadiness on feet: Secondary | ICD-10-CM | POA: Diagnosis not present

## 2019-07-22 DIAGNOSIS — R262 Difficulty in walking, not elsewhere classified: Secondary | ICD-10-CM | POA: Diagnosis not present

## 2019-07-27 ENCOUNTER — Telehealth: Payer: Self-pay | Admitting: Internal Medicine

## 2019-07-27 DIAGNOSIS — R296 Repeated falls: Secondary | ICD-10-CM | POA: Diagnosis not present

## 2019-07-27 DIAGNOSIS — R2681 Unsteadiness on feet: Secondary | ICD-10-CM | POA: Diagnosis not present

## 2019-07-27 DIAGNOSIS — R262 Difficulty in walking, not elsewhere classified: Secondary | ICD-10-CM | POA: Diagnosis not present

## 2019-07-27 NOTE — Chronic Care Management (AMB) (Signed)
  Chronic Care Management   Note  07/27/2019 Name: QUAVION BOULE MRN: [id number] DOB: 1946/04/20  WESSON STITH is a 74 y.o. year old male who is a primary care patient of [place], Theophilus Kinds, MD. I reached out to [name] by phone today in response to a referral sent by Mr. Youlanda Mighty Rosete's PCP, Venia Carbon, MD. Wife June, gave verbal consent. June will talk to Pharmacist for his phone visit.  Mr. Torry was given information about Chronic Care Management services today including:  1. CCM service includes personalized support from designated clinical staff supervised by his physician, including individualized plan of care and coordination with other care providers 2. 24/7 contact phone numbers for assistance for urgent and routine care needs. 3. Service will only be billed when office clinical staff spend 20 minutes or more in a month to coordinate care. 4. Only one practitioner may furnish and bill the service in a calendar month. 5. The patient may stop CCM services at any time (effective at the end of the month) by phone call to the office staff. 6. The patient will be responsible for cost sharing (co-pay) of up to 20% of the service fee (after annual deductible is met).  Patient agreed to services and verbal consent obtained.   Follow up plan:   Raynicia Dukes [employer] Scheduler

## 2019-08-02 NOTE — Chronic Care Management (AMB) (Signed)
Chronic Care Management Pharmacy  Name: Keith Hall  MRN: [id number] DOB: 1945/07/11  Chief Complaint/ HPI  Keith Hall,  74 y.o. , male presents for their Initial CCM visit with the clinical pharmacist via telephone. Visit completed with patient's wife, Keith Hall.  PCP : Keith Carbon, MD  Their chronic conditions include: MDD, osteoarthritis, carotid artery disease, hemiparesis due to CVA  Patient concerns: no concerns, doing well on current medications  Office Visits:  03/12/19: Keith Hall, hospitalization follow up - try melatonin for sleep, vertigo improving on meclizine, diazepam PRN  Consult Visit: none in past 6 months   Allergies  Allergen Reactions  . Atorvastatin Other (See Comments)    Elevated LFTs  . Penicillins Hives, Swelling and Rash    Did it involve swelling of the face/tongue/throat, SOB, or low BP? Yes Did it involve sudden or severe rash/hives, skin peeling, or any reaction on the inside of your mouth or nose? Yes Did you need to seek medical attention at a hospital or doctor's office? Yes When did it last happen?15 years If all above answers are "NO", may proceed with cephalosporin use.    Medications: Outpatient Encounter Medications as of 08/04/2019  Medication Sig  . acetaminophen (TYLENOL) 500 MG tablet Take 500 mg by mouth 3 (three) times daily as needed.   Marland Kitchen aspirin 81 MG chewable tablet Chew 81 mg by mouth daily.  Marland Kitchen buPROPion (WELLBUTRIN SR) 150 MG 12 hr tablet TAKE 1 TABLET (150 MG TOTAL) BY MOUTH 2 (TWO) TIMES DAILY EVERY MORNING/AFTER LUNCH  . citalopram (CELEXA) 20 MG tablet TAKE 1 TABLET (20 MG TOTAL) BY MOUTH DAILY.  . diazepam (VALIUM) 2 MG tablet Take 1-2 tablets (2-4 mg total) by mouth 3 (three) times daily as needed (vertigo).  . Glucosamine-Chondroitin (MOVE FREE PO) Take 2 tablets by mouth daily.  . meclizine (ANTIVERT) 25 MG tablet Take 1 tablet (25 mg total) by mouth 3 (three) times daily as needed for dizziness or nausea.   . Multiple Vitamin (MULTIVITAMIN) tablet Take 2 tablets by mouth daily.   . pantoprazole (PROTONIX) 40 MG tablet TAKE 1 TABLET BY MOUTH EVERY DAY   No facility-administered encounter medications on file as of 08/04/2019.   Current Diagnosis/Assessment: Goals    . Pharmacy Care Plan     Current Barriers:  . 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 Review safety and efficacy of medications every 6 months . Reduce risk of heart attack and strokes through preventative measures. Recommend trial of rosuvastatin for cholesterol. Experiencing elevated liver enzymes with one statin does not indicate you will not be able to tolerate another statin.  Interventions: . Comprehensive medication review performed.  Patient Self Care Activities:  . Self administers medications as prescribed  Initial goal documentation       CAD/History of CVA   Lipid Panel     Component Value Date/Time   CHOL 193 02/24/2018 1142   CHOL 121 07/06/2014 0427   TRIG 103.0 02/24/2018 1142   TRIG 125 07/06/2014 0427   HDL 52.50 02/24/2018 1142   HDL 47 07/06/2014 0427   CHOLHDL 4 02/24/2018 1142   VLDL 20.6 02/24/2018 1142   VLDL 25 07/06/2014 0427   LDLCALC 120 (H) 02/24/2018 1142   LDLCALC 49 07/06/2014 0427   LDLDIRECT 142.7 02/07/2014 1611   CMP Latest Ref Rng & Units 03/02/2019 03/01/2019 07/22/2018  Glucose 70 - 99 mg/dL 123(H) 122(H) 95  BUN 8 - 23 mg/dL 15 15 17   Creatinine 0.61 - 1.24 mg/dL 0.85 0.92 0.78  Sodium 135 - 145 mmol/L 141 137 138  Potassium 3.5 - 5.1 mmol/L 4.1 4.1 4.1  Chloride 98 - 111 mmol/L 108 103 107  CO2 22 - 32 mmol/L 25 24 25   Calcium 8.9 - 10.3 mg/dL 9.1 9.5 8.9  Total Protein 6.5 - 8.1 g/dL - 7.2 7.0  Total Bilirubin 0.3 - 1.2 mg/dL - 1.1 1.0  Alkaline Phos 38 - 126 U/L - 61 53  AST 15 - 41 U/L - 32 56(H)    ALT 0 - 44 U/L - 30 46(H)   Patient has failed these meds in past: atorvastatin 40 mg (elevated LFTs) Patient is currently controlled on the following medications:  Aspirin 81 mg - once daily  We discussed: diet and exercise; LFTs returned to normal limits  Plan: May consider re-challenge with rosuvastatin 10 mg and monitor LFTs   Major Depressive Disorder   Patient has failed these meds in past: none Patient is currently controlled on the following medications:  Citalopram 20 mg - 1 tablet once daily  Bupropion 150 mg - 1 tablet twice daily  We discussed: doing well, but mood fluctuates, denies adverse effects  Plan: Continue current medications  Vertigo   Patient has failed these meds in past:  Patient is currently controlled on the following medications:   Meclizine 25 mg - 1 tablet TID PRN  Diazepam 2 mg - 1-2 tablets TID PRN   We discussed: not taking anything right now, doing well, no vertigo episodes   Plan: Continue current medications  Osteoarthritis   Location: knees Patient has failed these meds in past: none Patient is currently controlled on the following medications:   OTC Tylenol - as needed  Move-Free PO (glucosamine-chondroitin) - 2 tablets daily  Plan: Continue current medications  Undiagnosed: Acid reflux   Patient has failed these meds in past: none Patient is currently controlled on the following medications:   Pantoprazole 40 mg - once daily  We discussed: no concerns  Plan: Continue current medications  Sleep Disturbance   Patient has failed these meds in past: Patient is currently controlled on the following medications:   Melatonin 5 mg - once daily at bedtime (wife wasn't  sure what dose patient is taking)  We discussed: working well , no concerns  Plan: Continue current medications   Medication Management  OTCs: multivitamin  Pharmacy: [facility] -> interested in packaging/delivery services, wants to see packaging  first  Adherence: pillbox, no concerns  Affordability: no concerns   CCM Follow Up:  January 19, 2020 at 3:00 PM (telephone)  Keith Hall, PharmD Clinical Pharmacist [facility] at [facility] [phone]

## 2019-08-03 ENCOUNTER — Telehealth: Payer: Self-pay

## 2019-08-03 DIAGNOSIS — R2681 Unsteadiness on feet: Secondary | ICD-10-CM | POA: Diagnosis not present

## 2019-08-03 DIAGNOSIS — R262 Difficulty in walking, not elsewhere classified: Secondary | ICD-10-CM | POA: Diagnosis not present

## 2019-08-03 DIAGNOSIS — R296 Repeated falls: Secondary | ICD-10-CM | POA: Diagnosis not present

## 2019-08-03 DIAGNOSIS — F331 Major depressive disorder, recurrent, moderate: Secondary | ICD-10-CM

## 2019-08-03 DIAGNOSIS — I779 Disorder of arteries and arterioles, unspecified: Secondary | ICD-10-CM

## 2019-08-03 NOTE — Telephone Encounter (Signed)
Referral created.

## 2019-08-03 NOTE — Telephone Encounter (Signed)
I would like to request a referral for Keith Hall to chronic care management pharmacy services focusing on the following conditions:   Carotid artery disease ([place]) [I77.9]  MDD (major depressive disorder), recurrent episode, moderate ([place]) [F33.1]  [name], PharmD Clinical Pharmacist [facility] at [facility] [phone]

## 2019-08-04 ENCOUNTER — Ambulatory Visit: Payer: PPO

## 2019-08-04 ENCOUNTER — Other Ambulatory Visit: Payer: Self-pay

## 2019-08-04 DIAGNOSIS — I69359 Hemiplegia and hemiparesis following cerebral infarction affecting unspecified side: Secondary | ICD-10-CM

## 2019-08-04 DIAGNOSIS — I6523 Occlusion and stenosis of bilateral carotid arteries: Secondary | ICD-10-CM

## 2019-08-04 DIAGNOSIS — R42 Dizziness and giddiness: Secondary | ICD-10-CM

## 2019-08-04 DIAGNOSIS — M545 Low back pain, unspecified: Secondary | ICD-10-CM

## 2019-08-04 DIAGNOSIS — F331 Major depressive disorder, recurrent, moderate: Secondary | ICD-10-CM

## 2019-08-04 DIAGNOSIS — G479 Sleep disorder, unspecified: Secondary | ICD-10-CM

## 2019-08-04 DIAGNOSIS — M170 Bilateral primary osteoarthritis of knee: Secondary | ICD-10-CM

## 2019-08-04 NOTE — Patient Instructions (Signed)
August 04, 2019  Dear Keith Hall,  It was a pleasure meeting you during our initial appointment on August 04, 2019. Below is a summary of the goals we discussed and components of chronic care management. Please contact me anytime with questions or concerns.   Visit Information  Goals Addressed            This Visit's Progress   . Pharmacy Care Plan       Current Barriers:  . 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 Review safety and efficacy of medications every 6 months . Reduce risk of heart attack and strokes through preventative measures. Recommend trial of rosuvastatin for cholesterol. Experiencing elevated liver enzymes with one statin does not indicate you will not be able to tolerate another statin.  Interventions: . Comprehensive medication review performed.  Patient Self Care Activities:  . Self administers medications as prescribed  Initial goal documentation        Keith Hall was given information about Chronic Care Management services today including:  1. CCM service includes personalized support from designated clinical staff supervised by his physician, including individualized plan of care and coordination with other care providers 2. 24/7 contact phone numbers for assistance for urgent and routine care needs. 3. Service will only be billed when office clinical staff spend 20 minutes or more in a month to coordinate care. 4. Only one practitioner may furnish and bill the service in a calendar month. 5. The patient may stop CCM services at any time (effective at the end of the month) by phone call to the office staff. 6. The patient will be responsible for cost sharing (co-pay) of up to 20% of the service fee (after annual deductible is met).  Patient agreed to services and verbal  consent obtained.   Telephone follow up appointment with pharmacy team member scheduled for:  January 19, 2020 at 3:00 PM (telephone)  [name], PharmD Clinical Pharmacist [facility] at [facility] [phone]   [place] stands for "Dietary Approaches to Stop Hypertension." The DASH eating plan is a healthy eating plan that has been shown to reduce high blood pressure (hypertension). It may also reduce your risk for type 2 diabetes, heart disease, and stroke. The DASH eating plan may also help with weight loss. What are tips for following this plan?  General guidelines  Avoid eating more than 2,300 mg (milligrams) of salt (sodium) a day. If you have hypertension, you may need to reduce your sodium intake to 1,500 mg a day.  Limit alcohol intake to no more than 1 drink a day for nonpregnant women and 2 drinks a day for men. One drink equals 12 oz of beer, 5 oz of wine, or 1 oz of hard liquor.  Work with your health care provider to maintain a healthy body weight or to lose weight. Ask what an ideal weight is for you.  Get at least 30 minutes of exercise that causes your heart to beat faster (aerobic exercise) most days of the week. Activities may include walking, swimming, or biking.  Work with your health care provider or diet and nutrition specialist (dietitian) to adjust your eating plan to your individual calorie needs. Reading food labels   Check food labels for the amount of sodium per serving. Choose foods with less than 5 percent of the Daily Value  of sodium. Generally, foods with less than 300 mg of sodium per serving fit into this eating plan.  To find whole grains, look for the word "whole" as the first word in the ingredient list. Shopping  Buy products labeled as "low-sodium" or "no salt added."  Buy fresh foods. Avoid canned foods and premade or frozen meals. Cooking  Avoid adding salt when cooking. Use salt-free seasonings or herbs  instead of table salt or sea salt. Check with your health care provider or pharmacist before using salt substitutes.  Do not fry foods. Cook foods using healthy methods such as baking, boiling, grilling, and broiling instead.  Cook with heart-healthy oils, such as olive, canola, soybean, or sunflower oil. Meal planning  Eat a balanced diet that includes: ? 5 or more servings of fruits and vegetables each day. At each meal, try to fill half of your plate with fruits and vegetables. ? Up to 6-8 servings of whole grains each day. ? Less than 6 oz of lean meat, poultry, or fish each day. A 3-oz serving of meat is about the same size as a deck of cards. One egg equals 1 oz. ? 2 servings of low-fat dairy each day. ? A serving of nuts, seeds, or beans 5 times each week. ? Heart-healthy fats. Healthy fats called Omega-3 fatty acids are found in foods such as flaxseeds and coldwater fish, like sardines, salmon, and mackerel.  Limit how much you eat of the following: ? Canned or prepackaged foods. ? Food that is high in trans fat, such as fried foods. ? Food that is high in saturated fat, such as fatty meat. ? Sweets, desserts, sugary drinks, and other foods with added sugar. ? Full-fat dairy products.  Do not salt foods before eating.  Try to eat at least 2 vegetarian meals each week.  Eat more home-cooked food and less restaurant, buffet, and fast food.  When eating at a restaurant, ask that your food be prepared with less salt or no salt, if possible. What foods are recommended? The items listed may not be a complete list. Talk with your dietitian about what dietary choices are best for you. Grains Whole-grain or whole-wheat bread. Whole-grain or whole-wheat pasta. Brown rice. [place]. Bulgur. Whole-grain and low-sodium cereals. Pita bread. Low-fat, low-sodium crackers. Whole-wheat flour tortillas. Vegetables Fresh or frozen vegetables (raw, steamed, roasted, or grilled).  Low-sodium or reduced-sodium tomato and vegetable juice. Low-sodium or reduced-sodium tomato sauce and tomato paste. Low-sodium or reduced-sodium canned vegetables. Fruits All fresh, dried, or frozen fruit. Canned fruit in natural juice (without added sugar). Meat and other protein foods Skinless chicken or [place]. Ground chicken or [place]. Pork with fat trimmed off. Fish and seafood. Egg whites. Dried beans, peas, or lentils. Unsalted nuts, nut butters, and seeds. Unsalted canned beans. Lean cuts of beef with fat trimmed off. Low-sodium, lean deli meat. Dairy Low-fat (1%) or fat-free (skim) milk. Fat-free, low-fat, or reduced-fat cheeses. Nonfat, low-sodium ricotta or cottage cheese. Low-fat or nonfat yogurt. Low-fat, low-sodium cheese. Fats and oils Soft margarine without trans fats. Vegetable oil. Low-fat, reduced-fat, or light mayonnaise and salad dressings (reduced-sodium). Canola, safflower, olive, soybean, and sunflower oils. Avocado. Seasoning and other foods Herbs. Spices. Seasoning mixes without salt. Unsalted popcorn and pretzels. Fat-free sweets. What foods are not recommended? The items listed may not be a complete list. Talk with your dietitian about what dietary choices are best for you. Grains Baked goods made with fat, such as croissants, muffins, or some breads. Dry  pasta or rice meal packs. Vegetables Creamed or fried vegetables. Vegetables in a cheese sauce. Regular canned vegetables (not low-sodium or reduced-sodium). Regular canned tomato sauce and paste (not low-sodium or reduced-sodium). Regular tomato and vegetable juice (not low-sodium or reduced-sodium). Angie Fava. Olives. Fruits Canned fruit in a light or heavy syrup. Fried fruit. Fruit in cream or butter sauce. Meat and other protein foods Fatty cuts of meat. Ribs. Fried meat. Berniece Salines. Sausage. Bologna and other processed lunch meats. Salami. Fatback. Hotdogs. Bratwurst. Salted nuts and seeds. Canned beans with added  salt. Canned or smoked fish. Whole eggs or egg yolks. Chicken or [place] with skin. Dairy Whole or 2% milk, cream, and half-and-half. Whole or full-fat cream cheese. Whole-fat or sweetened yogurt. Full-fat cheese. Nondairy creamers. Whipped toppings. Processed cheese and cheese spreads. Fats and oils Butter. Stick margarine. Lard. Shortening. Ghee. Bacon fat. Tropical oils, such as coconut, palm kernel, or palm oil. Seasoning and other foods Salted popcorn and pretzels. Onion salt, garlic salt, seasoned salt, table salt, and sea salt. Worcestershire sauce. Tartar sauce. Barbecue sauce. Teriyaki sauce. Soy sauce, including reduced-sodium. Steak sauce. Canned and packaged gravies. Fish sauce. Oyster sauce. Cocktail sauce. Horseradish that you find on the shelf. Ketchup. Mustard. Meat flavorings and tenderizers. Bouillon cubes. Hot sauce and Tabasco sauce. Premade or packaged marinades. Premade or packaged taco seasonings. Relishes. Regular salad dressings. Where to find more information:  National Heart, Lung, and [place]: https://wilson-eaton.com/  American Heart Association: www.heart.org Summary  The DASH eating plan is a healthy eating plan that has been shown to reduce high blood pressure (hypertension). It may also reduce your risk for type 2 diabetes, heart disease, and stroke.  With the DASH eating plan, you should limit salt (sodium) intake to 2,300 mg a day. If you have hypertension, you may need to reduce your sodium intake to 1,500 mg a day.  When on the DASH eating plan, aim to eat more fresh fruits and vegetables, whole grains, lean proteins, low-fat dairy, and heart-healthy fats.  Work with your health care provider or diet and nutrition specialist (dietitian) to adjust your eating plan to your individual calorie needs. This information is not intended to replace advice given to you by your health care provider. Make sure you discuss any questions you have with your health care  provider. Document Revised: 05/16/2017 Document Reviewed: 05/27/2016 Elsevier Patient Education  2020 [employer].

## 2019-08-10 DIAGNOSIS — R262 Difficulty in walking, not elsewhere classified: Secondary | ICD-10-CM | POA: Diagnosis not present

## 2019-08-10 DIAGNOSIS — R296 Repeated falls: Secondary | ICD-10-CM | POA: Diagnosis not present

## 2019-08-10 DIAGNOSIS — R2681 Unsteadiness on feet: Secondary | ICD-10-CM | POA: Diagnosis not present

## 2019-08-10 NOTE — Addendum Note (Signed)
Addended by: Pilar Grammes on: 08/10/2019 03:30 PM   Modules accepted: Orders

## 2019-08-12 ENCOUNTER — Ambulatory Visit: Payer: Self-pay

## 2019-08-12 DIAGNOSIS — I6523 Occlusion and stenosis of bilateral carotid arteries: Secondary | ICD-10-CM

## 2019-08-12 DIAGNOSIS — I779 Disorder of arteries and arterioles, unspecified: Secondary | ICD-10-CM

## 2019-08-12 NOTE — Chronic Care Management (AMB) (Signed)
   Chronic Care Management Pharmacy  Name: Keith Hall  MRN: [id number] DOB: [birth year]  Keith Hall,  74 y.o. , male contacted for medication follow up CCM visit with the clinical pharmacist via telephone.  PCP : Venia Carbon, MD  Contacted patient to discuss recommendation for rosuvastatin 5 mg daily approved by PCP. Patient is agreeable to plan. He has a physical with Dr. Silvio Pate on March 29th and would be glad to go to the lab that day if needed. Will ask PCP to send prescription to his pharmacy and order labs.  [name], PharmD Clinical Pharmacist [facility] at [facility] [phone]

## 2019-08-13 ENCOUNTER — Other Ambulatory Visit: Payer: Self-pay | Admitting: Internal Medicine

## 2019-08-13 MED ORDER — ROSUVASTATIN CALCIUM 5 MG PO TABS: 5.0000 mg | ORAL_TABLET | Freq: Every day | ORAL | 3 refills | Status: DC

## 2019-08-13 NOTE — Progress Notes (Signed)
Please let him know that I sent the prescription I can just check his labs at the appt at the end of March

## 2019-08-24 NOTE — Telephone Encounter (Signed)
Note opened in error.

## 2019-08-30 ENCOUNTER — Telehealth: Payer: Self-pay

## 2019-08-30 NOTE — Telephone Encounter (Signed)
[place] Night - Client TELEPHONE ADVICE RECORD [facility] Patient Name: Keith Hall Gender: Male DOB: Jun 11, 1946 Age: 74 Y 10 M 4 D Return Phone Number: [phone] (Primary) Address[place] [address] Client [facility] Night - Client Client Site [place] Physician Viviana Simpler - MD Contact Type Call Who Is Calling Patient / Member / Family / Caregiver Call Type Triage / Clinical Relationship To Patient Self Return Phone Number [phone] (Primary) Chief Complaint Medication reaction Reason for Call Medication Question / Request Initial Comment Caller states he was switched from a 525 aspirin to 81 aspirin and he does not feel good. He wanted to know if he could switch back Translation No Nurse Assessment Nurse: Rosana Hoes, RN, Candace Date/Time (Eastern Time): 08/28/2019 11:10:49 AM Confirm and document reason for call. If symptomatic, describe symptoms. ---Caller states: started taking a new cholesterol medication 3 days ago and now feels very fatigued. Has the patient had close contact with a person known or suspected to have the novel coronavirus illness OR traveled / lives in area with major community spread (including international travel) in the last 14 days from the onset of symptoms? * If Asymptomatic, screen for exposure and travel within the last 14 days. ---No Does the patient have any new or worsening symptoms? ---Yes Will a triage be completed? ---Yes Related visit to physician within the last 2 weeks? ---No Does the PT have any chronic conditions? (i.e. diabetes, asthma, this includes High risk factors for pregnancy, etc.) ---Yes List chronic conditions. ---high cholesterol, depression Is this a behavioral health or substance abuse call? ---No Guidelines Guideline Title Affirmed Question Affirmed Notes Nurse Date/Time (Eastern Time) Weakness  (Generalized) and Fatigue [1] MODERATE weakness (i.e., interferes with work, school, normal activities) AND [2] cause unknown (Exceptions: weakness with acute minor illness, Rosana Hoes, RN, Candace 08/28/2019 11:22:24 AM PLEASE NOTE: All timestamps contained within this report are represented as [place] Standard Time. CONFIDENTIALTY NOTICE: This fax transmission is intended only for the addressee. It contains information that is legally privileged, confidential or otherwise protected from use or disclosure. If you are not the intended recipient, you are strictly prohibited from reviewing, disclosing, copying using or disseminating any of this information or taking any action in reliance on or regarding this information. If you have received this fax in error, please notify us immediately by telephone so that we can arrange for its return to [place]. Phone: [phone], Toll-Free: [phone], Fax: [phone] Page: 2 of 2 Call Id: [id number] Guidelines Guideline Title Affirmed Question Affirmed Notes Nurse Date/Time Eilene Ghazi Time) or weakness from poor fluid intake) Disp. Time Eilene Ghazi Time) Disposition Final User 08/28/2019 11:24:55 AM See HCP within 4 Hours (or PCP triage) Yes Rosana Hoes, RN, Candace Caller Disagree/Comply Comply Caller Understands Yes PreDisposition Call Doctor Care Advice Given Per Guideline SEE HCP WITHIN 4 HOURS (OR PCP TRIAGE): * IF OFFICE WILL BE CLOSED AND NO PCP (PRIMARY CARE PROVIDER) SECOND-LEVEL TRIAGE: You need to be seen within the next 3 or 4 hours. A nearby Urgent Care Center [facility]) is often a good source of care. Another choice is to go to the ED. Go sooner if you become worse. CALL BACK IF: * You become worse. CARE ADVICE given per Weakness and Fatigue (Adult) guideline. Comments User: Marina Goodell, RN Date/Time Eilene Ghazi Time): 08/28/2019 11:26:02 AM Caller states she will call office on Monday for appt instead of going to [facility]. Referrals GO TO FACILITY  REFUSED

## 2019-08-30 NOTE — Telephone Encounter (Signed)
LVM. Encounter says he is not feeling well due to dosage change of aspirin and starting new cholesterol med.

## 2019-08-31 NOTE — Telephone Encounter (Signed)
Contacted pt's wife, Keith Hall, and she states "he freaks out when he does not feel good." Keith Hall reports pt has not been doing anything since PT has ended and he is still having difficulty getting up by himself so they help him and that makes his arm sore from pulling him up.  Talked to pt and he denies any concerns and reports he is doing better today but still tends to sleep a lot. Advised if anything is needed to contact the office. Pt verbalized understanding.

## 2019-08-31 NOTE — Telephone Encounter (Signed)
It is safer to use the lower dose aspirin and just as effective as the 325mg  in preventing another stroke. We can monitor how he feels if he has concerns about the new (very low dose) cholesterol medication

## 2019-08-31 NOTE — Telephone Encounter (Signed)
Tried to call pt. No answer and no VM available.

## 2019-09-08 ENCOUNTER — Telehealth: Payer: Self-pay

## 2019-09-08 ENCOUNTER — Ambulatory Visit: Payer: Self-pay

## 2019-09-08 DIAGNOSIS — I69359 Hemiplegia and hemiparesis following cerebral infarction affecting unspecified side: Secondary | ICD-10-CM

## 2019-09-08 DIAGNOSIS — I6523 Occlusion and stenosis of bilateral carotid arteries: Secondary | ICD-10-CM

## 2019-09-08 NOTE — Telephone Encounter (Signed)
Dr. Silvio Pate,  Mr. Goetschius would like to know if he needs to schedule a lab appointment for 3/29 prior to physical for LFTs and/or lipid panel? Its been about 4 weeks since starting rosuvastatin 5 mg.   Thank you!  [name], PharmD Clinical Pharmacist [facility] at [facility] [phone]

## 2019-09-08 NOTE — Telephone Encounter (Signed)
Spoke to pt's wife. She said he is feeling better. He has an appt here 09-13-19. Will address then if needed.

## 2019-09-08 NOTE — Chronic Care Management (AMB) (Signed)
    Chronic Care Management Pharmacy  Name: Keith Hall  MRN: [id number] DOB: [birth year]  Chief Complaint/ HPI  Keith Hall,  74 y.o. , male presents for their Follow-Up CCM visit with the clinical pharmacist via telephone. Visit completed with patient and patient's wife, June.  PCP : Venia Carbon, MD  Their chronic conditions include: carotid artery disease, hemiparesis due to CVA  Patient concerns: Follow up regarding patient not feeling well after starting rosuvastatin.  Current Diagnosis/Assessment: CAD/History of CVA   Lipid Panel     Component Value Date/Time   CHOL 193 02/24/2018 1142   CHOL 121 07/06/2014 0427   TRIG 103.0 02/24/2018 1142   TRIG 125 07/06/2014 0427   HDL 52.50 02/24/2018 1142   HDL 47 07/06/2014 0427   CHOLHDL 4 02/24/2018 1142   VLDL 20.6 02/24/2018 1142   VLDL 25 07/06/2014 0427   LDLCALC 120 (H) 02/24/2018 1142   LDLCALC 49 07/06/2014 0427   LDLDIRECT 142.7 02/07/2014 1611   CMP Latest Ref Rng & Units 03/02/2019 03/01/2019 07/22/2018  Glucose 70 - 99 mg/dL 123(H) 122(H) 95  BUN 8 - 23 mg/dL 15 15 17   Creatinine 0.61 - 1.24 mg/dL 0.85 0.92 0.78  Sodium 135 - 145 mmol/L 141 137 138  Potassium 3.5 - 5.1 mmol/L 4.1 4.1 4.1  Chloride 98 - 111 mmol/L 108 103 107  CO2 22 - 32 mmol/L 25 24 25   Calcium 8.9 - 10.3 mg/dL 9.1 9.5 8.9  Total Protein 6.5 - 8.1 g/dL - 7.2 7.0  Total Bilirubin 0.3 - 1.2 mg/dL - 1.1 1.0  Alkaline Phos 38 - 126 U/L - 61 53  AST 15 - 41 U/L - 32 56(H)  ALT 0 - 44 U/L - 30 46(H)   LDL goal < 70 Patient has failed these meds in past: atorvastatin 40 mg (elevated LFTs) Patient is currently uncontrolled on the following medications:  Aspirin 81 mg - once daily  Rosuvastatin 5 mg - once daily (started 08/13/19)  We discussed: Began taking aspirin 325 mg earlier this month by accident but has returned to aspirin 81 mg, doing well now on rosuvastatin 5 mg, denies adverse effects, need to assess LFTs and lipid panel     Plan: Follow up with PCP on 3/29, check LFTs and fasting lipid panel.  Medication Management  OTCs: multivitamin  Pharmacy: [facility] --> interested in packaging/delivery services, wants to see packaging first to see if he can manipulate with one hand  Adherence: pillbox, no concerns  Affordability: no concerns   CCM Follow Up:  January 19, 2020 at 3:00 PM (telephone)  [name], PharmD Clinical Pharmacist [facility] at [facility] [phone]

## 2019-09-09 NOTE — Telephone Encounter (Signed)
Great, I will let him know. Thank you!

## 2019-09-09 NOTE — Telephone Encounter (Signed)
I can just check the labs when he is here---unless he really wants to come in before. He should just fast for 3-4 hours before the appt if possible

## 2019-09-13 ENCOUNTER — Ambulatory Visit (INDEPENDENT_AMBULATORY_CARE_PROVIDER_SITE_OTHER): Payer: PPO | Admitting: Internal Medicine

## 2019-09-13 ENCOUNTER — Encounter: Payer: Self-pay | Admitting: Internal Medicine

## 2019-09-13 ENCOUNTER — Other Ambulatory Visit: Payer: Self-pay

## 2019-09-13 VITALS — BP 110/70 | HR 103 | Temp 98.0°F | Ht 69.0 in | Wt 205.0 lb

## 2019-09-13 DIAGNOSIS — I69359 Hemiplegia and hemiparesis following cerebral infarction affecting unspecified side: Secondary | ICD-10-CM | POA: Diagnosis not present

## 2019-09-13 DIAGNOSIS — N401 Enlarged prostate with lower urinary tract symptoms: Secondary | ICD-10-CM

## 2019-09-13 DIAGNOSIS — Z0000 Encounter for general adult medical examination without abnormal findings: Secondary | ICD-10-CM

## 2019-09-13 DIAGNOSIS — R5383 Other fatigue: Secondary | ICD-10-CM

## 2019-09-13 DIAGNOSIS — I6523 Occlusion and stenosis of bilateral carotid arteries: Secondary | ICD-10-CM

## 2019-09-13 DIAGNOSIS — Z7189 Other specified counseling: Secondary | ICD-10-CM

## 2019-09-13 DIAGNOSIS — F331 Major depressive disorder, recurrent, moderate: Secondary | ICD-10-CM | POA: Diagnosis not present

## 2019-09-13 LAB — CBC
HCT: 41.9 % (ref 39.0–52.0)
Hemoglobin: 14.6 g/dL (ref 13.0–17.0)
MCHC: 34.9 g/dL (ref 30.0–36.0)
MCV: 90.8 fl (ref 78.0–100.0)
Platelets: 152 10*3/uL (ref 150.0–400.0)
RBC: 4.61 Mil/uL (ref 4.22–5.81)
RDW: 12.9 % (ref 11.5–15.5)
WBC: 5.8 10*3/uL (ref 4.0–10.5)

## 2019-09-13 LAB — COMPREHENSIVE METABOLIC PANEL
ALT: 31 U/L (ref 0–53)
AST: 39 U/L — ABNORMAL HIGH (ref 0–37)
Albumin: 4.7 g/dL (ref 3.5–5.2)
Alkaline Phosphatase: 68 U/L (ref 39–117)
BUN: 19 mg/dL (ref 6–23)
CO2: 27 mEq/L (ref 19–32)
Calcium: 9.7 mg/dL (ref 8.4–10.5)
Chloride: 103 mEq/L (ref 96–112)
Creatinine, Ser: 0.98 mg/dL (ref 0.40–1.50)
GFR: 74.39 mL/min (ref >60.00)
Glucose, Bld: 91 mg/dL (ref 70–99)
Potassium: 4.4 mEq/L (ref 3.5–5.1)
Sodium: 138 mEq/L (ref 135–145)
Total Bilirubin: 0.9 mg/dL (ref 0.2–1.2)
Total Protein: 7.5 g/dL (ref 6.0–8.3)

## 2019-09-13 LAB — T4, FREE: Free T4: 0.95 ng/dL (ref 0.60–1.60)

## 2019-09-13 LAB — LIPID PANEL
Cholesterol: 140 mg/dL (ref 0–200)
HDL: 48.5 mg/dL (ref >39.00)
LDL Cholesterol: 74 mg/dL (ref 0–99)
NonHDL: 91.11
Total CHOL/HDL Ratio: 3
Triglycerides: 84 mg/dL (ref 0.0–149.0)
VLDL: 16.8 mg/dL (ref 0.0–40.0)

## 2019-09-13 LAB — SEDIMENTATION RATE: Sed Rate: 8 mm/hr (ref 0–20)

## 2019-09-13 NOTE — Assessment & Plan Note (Signed)
Has symptoms but not bad for now No changes

## 2019-09-13 NOTE — Assessment & Plan Note (Signed)
Stable disability BP okay On statin now/ASA

## 2019-09-13 NOTE — Assessment & Plan Note (Signed)
I have personally reviewed the Medicare Annual Wellness questionnaire and have noted 1. The patient's medical and social history 2. Their use of alcohol, tobacco or illicit drugs 3. Their current medications and supplements 4. The patient's functional ability including ADL's, fall risks, home safety risks and hearing or visual             impairment. 5. Diet and physical activities 6. Evidence for depression or mood disorders  The patients weight, height, BMI and visual acuity have been recorded in the chart I have made referrals, counseling and provided education to the patient based review of the above and I have provided the pt with a written personalized care plan for preventive services.  I have provided you with a copy of your personalized plan for preventive services. Please take the time to review along with your updated medication list.  Will consider colon next year--due then No PSA due to age Discussed trying some home exercises at least Flu vaccine in the fall Had COVID vaccines Consider shingrix
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
## 2019-09-13 NOTE — Assessment & Plan Note (Signed)
Stenosis Now on statin

## 2019-09-13 NOTE — Assessment & Plan Note (Signed)
See social history 

## 2019-09-13 NOTE — Patient Instructions (Signed)
Please set up an exam with a dermatologist for the left forearm spot. (Dr Kowalski/Schwer/Moye)

## 2019-09-13 NOTE — Progress Notes (Signed)
Hearing Screening   125Hz  250Hz  500Hz  1000Hz  2000Hz  3000Hz  4000Hz  6000Hz  8000Hz   Right ear:           Left ear:           Comments: Has hearing aids. Wearing them today  Vision Screening Comments: Cataract surgery several years ago. Was told he did not need to be seen routinely.

## 2019-09-13 NOTE — Assessment & Plan Note (Signed)
Sounds like usual afternoon lull Will just check labs

## 2019-09-13 NOTE — Assessment & Plan Note (Signed)
Goes back most of life Worse with stroke Some dysthymia---but not daily symptoms Will continue current meds

## 2019-09-16 DIAGNOSIS — C44619 Basal cell carcinoma of skin of left upper limb, including shoulder: Secondary | ICD-10-CM | POA: Diagnosis not present

## 2019-09-16 DIAGNOSIS — L814 Other melanin hyperpigmentation: Secondary | ICD-10-CM | POA: Diagnosis not present

## 2019-09-16 DIAGNOSIS — C44519 Basal cell carcinoma of skin of other part of trunk: Secondary | ICD-10-CM | POA: Diagnosis not present

## 2019-09-16 DIAGNOSIS — C44612 Basal cell carcinoma of skin of right upper limb, including shoulder: Secondary | ICD-10-CM | POA: Diagnosis not present

## 2019-09-16 DIAGNOSIS — Z808 Family history of malignant neoplasm of other organs or systems: Secondary | ICD-10-CM | POA: Diagnosis not present

## 2019-09-16 DIAGNOSIS — C44219 Basal cell carcinoma of skin of left ear and external auricular canal: Secondary | ICD-10-CM | POA: Diagnosis not present

## 2019-09-16 DIAGNOSIS — D485 Neoplasm of uncertain behavior of skin: Secondary | ICD-10-CM | POA: Diagnosis not present

## 2019-09-16 DIAGNOSIS — L570 Actinic keratosis: Secondary | ICD-10-CM | POA: Diagnosis not present

## 2019-11-04 DIAGNOSIS — L988 Other specified disorders of the skin and subcutaneous tissue: Secondary | ICD-10-CM | POA: Diagnosis not present

## 2019-11-04 DIAGNOSIS — L578 Other skin changes due to chronic exposure to nonionizing radiation: Secondary | ICD-10-CM | POA: Diagnosis not present

## 2019-11-04 DIAGNOSIS — L814 Other melanin hyperpigmentation: Secondary | ICD-10-CM | POA: Diagnosis not present

## 2019-11-04 DIAGNOSIS — C44219 Basal cell carcinoma of skin of left ear and external auricular canal: Secondary | ICD-10-CM | POA: Diagnosis not present

## 2019-11-11 ENCOUNTER — Ambulatory Visit: Payer: Self-pay | Admitting: Dermatology

## 2019-11-20 ENCOUNTER — Other Ambulatory Visit: Payer: Self-pay | Admitting: Internal Medicine

## 2020-01-19 ENCOUNTER — Other Ambulatory Visit: Payer: Self-pay

## 2020-01-19 ENCOUNTER — Ambulatory Visit: Payer: PPO

## 2020-01-19 DIAGNOSIS — I69359 Hemiplegia and hemiparesis following cerebral infarction affecting unspecified side: Secondary | ICD-10-CM

## 2020-01-19 DIAGNOSIS — I779 Disorder of arteries and arterioles, unspecified: Secondary | ICD-10-CM

## 2020-01-19 NOTE — Patient Instructions (Signed)
Dear Keith Hall,  Below is a summary of the goals we discussed during our follow up appointment on January 19, 2020. Please contact me anytime with questions or concerns.   Visit Information  Goals Addressed            This [place] (see longitudinal plan of care for additional care plan information)  Current Barriers:   Chronic Disease Management support, education, and care coordination needs related to Hyperlipidemia and Bone Health  Hyperlipidemia Lab Results  Component Value Date/Time   LDLCALC 74 09/13/2019 03:18 PM   LDLCALC 49 07/06/2014 04:27 AM   LDLDIRECT 142.7 02/07/2014 04:11 PM   Pharmacist Clinical Goal(s): o Over the next 6 months, patient will work with PharmD and providers to achieve LDL goal < 70  Current regimen:  o Rosuvastatin 5 mg - take one tablet daily  Interventions: o Reviewed most recent labs  o Assessment: Lipid panel updated after 4 weeks on statin. LDL 74, slightly above goal, but would prefer to stay on low dose given age and history of LFT elevations on high dose.   Patient self care activities - Over the next 6 months, patient will: o Continue current medications   Additional recommendations:  Recommend total daily intake of vitamin D3 800 IU and 1200 mg calcium through dietary intake and supplementation to reduce risk of fractures and improve bone health.  Medication management  Pharmacist Clinical Goal(s): o Over the next 6 months, patient will work with PharmD and providers to maintain optimal medication adherence  Current pharmacy: [facility]  Interventions o Discussed adherence packaging and pharmacist coordination of delivery o Will deliver a sample packaging from [facility] for patient to try out   Patient self care activities: o Take medications as prescribed o Report any questions or concerns to PharmD and/or provider(s)  Please see past updates  related to this goal by clicking on the "Past Updates" button in the selected goal        The patient verbalized understanding of instructions provided today and agreed to receive a mailed copy of patient instruction and/or educational materials.  Telephone follow up appointment with pharmacy team member scheduled for:  07/27/20 at 1 PM (telephone visit)  [name], PharmD Clinical Pharmacist [facility] [phone]   Calcium Content in Foods Calcium is the most abundant mineral in your body. Most of your body's calcium supply is stored in your bones and teeth. Calcium helps many parts of the body function normally, including:  Blood and blood vessels.  Nerves.  Hormones.  Muscles.  Bones and teeth. When your calcium stores are low, you may be at risk for low bone mass, bone loss, and broken bones (fractures). When you get enough calcium, it helps to support strong bones and teeth throughout your life. Calcium is especially important for:  Children during growth spurts.  Girls during adolescence.  Women who are pregnant or breastfeeding.  Women after their menstrual cycle stops (postmenopause).  Women whose menstrual cycle has stopped due to anorexia nervosa or regular intense exercise.  People who cannot eat or digest dairy products.  Vegans. What are tips for getting more calcium? General information  Try to get most of your calcium from food. Eat foods that are high in calcium.  Some people may benefit from taking calcium supplements. Check with your health care provider or diet and nutrition  specialist (dietitian) before starting any calcium supplements. Calcium supplements may interact with certain medicines. Too much calcium may cause other health problems, like constipation and kidney stones.  For the body to absorb calcium, it needs vitamin D. Sources of vitamin D include: ? Skin exposure to direct sunlight. ? Foods, such as  egg yolks, liver, saltwater fish, and fortified milk. ? Vitamin D supplements. Check with your health care provider or dietitian before starting any vitamin D supplements. What foods are high in calcium?  High-calcium foods are those that contain more than 100 milligrams (mg) of calcium per serving. Fruits  Fortified orange or other fruit juice, 300 mg per 8 oz serving. Vegetables  Collard greens, 360 mg per 8 oz serving.  Kale, 180 mg per 8 oz serving.  Bok choy, 160 mg per 8 oz serving. Grains  Fortified ready-to-eat cereals, 100-1,000 mg per 8 oz serving.  Fortified frozen waffles, 200 mg in two waffles. Meats and other proteins  Sardines, canned with bones, 325 mg per 3 oz serving.  Salmon, canned with bones, 180 mg per 3 oz serving.  Canned shrimp, 125 mg per 3 oz serving.  Baked beans, 160 mg per 4 oz serving. Dairy  Yogurt, plain, low-fat, 310 mg per 6 oz serving.  Milk, 300 mg per 8 oz serving.  American cheese, 195 mg per 1 oz serving.  Cheddar cheese, 205 mg per 1 oz serving.  Cottage cheese 2%, 105 mg per 4 oz serving.  Fortified soy, rice, or almond milk, 300 mg per 8 oz serving. The items listed above may not be a complete list of foods high in calcium. Actual amounts of calcium may be different depending on processing. Contact a dietitian for more information. What foods are lower in calcium? Foods lower in calcium are those that contain 50 mg of calcium or less per serving. Fruits  Apple, about 6 mg in one apple.  Banana, about 12 mg in one banana. Vegetables  Lettuce, 19 mg per 2 oz serving.  Tomato, about 11 mg in one tomato. Grains  Rice, 4 mg per 6 oz serving.  Boiled potatoes, 14 mg per 8 oz serving.  White bread, 6 mg in one slice. Meats and other proteins  Egg, 27 mg per 2 oz serving.  Red meat, 7 mg per 4 oz serving.  Chicken, 17 mg per 4 oz serving.  Fish, cod or trout, 20 mg per 4 oz serving. The items listed above may  not be a complete list of foods lower in calcium. Actual amounts of calcium may be different depending on processing. Contact a dietitian for more information. Summary  Calcium is an important mineral in the body because it affects many functions. Getting enough calcium helps support strong bones and teeth throughout your life.  Try to get most of your calcium from food.  Calcium supplements may interact with certain medicines. Check with your health care provider before starting any calcium supplements. This information is not intended to replace advice given to you by your health care provider. Make sure you discuss any questions you have with your health care provider. Document Revised: 05/27/2017 Document Reviewed: 05/27/2017 Elsevier Patient Education  2020 [employer].

## 2020-01-19 NOTE — Chronic Care Management (AMB) (Signed)
Chronic Care Management Pharmacy  Name: Keith Hall  MRN: [id number] DOB: 1945-12-01  Chief Complaint/ HPI  Keith Hall,  74 y.o. , male presents for their Follow-Up CCM visit with the clinical pharmacist via telephone. Visit completed with patient's wife, Keith Hall.  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 No recent falls, but still stumbles frequently. Finished PT a couple months ago.   Last CCM visit 09/08/19, no medication changes   Office Visits:  09/13/19: Keith Hall - reports balance is terrible, 3 falls, no significant pain, some memory problems, tried all the time, dizzy spells, doing okay on low dose statin, upset easily, still depressed; continue current medications, f/u with dermatologist for spot of left forearm   03/12/19: Keith Hall, hospitalization follow up - try melatonin for sleep, vertigo improving on meclizine, diazepam PRN  Consult Visit:   11/04/19: Dermatology - Keith Hall treated via Mohs surgery today, rtc 1 week  09/16/19: Dermatology -  Shave biopsy for possible skin cancer on left arm   Allergies  Allergen Reactions  . Atorvastatin Other (See Comments)    Elevated LFTs  . Penicillins Hives, Swelling and Rash    Did it involve swelling of the face/tongue/throat, SOB, or low BP? Yes Did it involve sudden or severe rash/hives, skin peeling, or any reaction on the inside of your mouth or nose? Yes Did you need to seek medical attention at a hospital or doctor's office? Yes When did it last happen?15 years If all above answers are "NO", may proceed with cephalosporin use.    Medications: Outpatient Encounter Medications as of 01/19/2020  Medication Sig  . acetaminophen (TYLENOL) 500 MG tablet Take 500 mg by mouth 3 (three) times daily as needed.   Marland Kitchen aspirin 81 MG chewable tablet Chew 81 mg by mouth  daily.  Marland Kitchen buPROPion (WELLBUTRIN SR) 150 MG 12 hr tablet TAKE 1 TABLET (150 MG TOTAL) BY MOUTH 2 (TWO) TIMES DAILY EVERY MORNING/AFTER LUNCH  . citalopram (CELEXA) 20 MG tablet TAKE 1 TABLET (20 MG TOTAL) BY MOUTH DAILY.  . diazepam (VALIUM) 2 MG tablet Take 1-2 tablets (2-4 mg total) by mouth 3 (three) times daily as needed (vertigo).  . Glucosamine-Chondroitin (MOVE FREE PO) Take 2 tablets by mouth daily.  . meclizine (ANTIVERT) 25 MG tablet Take 1 tablet (25 mg total) by mouth 3 (three) times daily as needed for dizziness or nausea.  . Melatonin 3-10 MG TABS Take by mouth at bedtime.  . Multiple Vitamin (MULTIVITAMIN) tablet Take 2 tablets by mouth daily.   . pantoprazole (PROTONIX) 40 MG tablet TAKE 1 TABLET BY MOUTH EVERY DAY  . rosuvastatin (CRESTOR) 5 MG tablet Take 1 tablet (5 mg total) by mouth daily.   No facility-administered encounter medications on file as of 01/19/2020.   Current Diagnosis/Assessment: Goals    . Pharmacy Care Plan     CARE PLAN ENTRY (see longitudinal plan of care for additional care plan information)  Current Barriers:  . Chronic Disease Management support, education, and care coordination needs related to Hyperlipidemia and Bone Health  Hyperlipidemia Lab Results  Component Value Date/Time   LDLCALC 74 09/13/2019 03:18 PM   LDLCALC 49 07/06/2014 04:27 AM   LDLDIRECT 142.7 02/07/2014 04:11 PM .  Pharmacist Clinical Goal(s): o Over the next 6 months, patient will work with PharmD and providers to achieve LDL  goal < 70 . Current regimen:  o Rosuvastatin 5 mg - take one tablet daily . Interventions: o Reviewed most recent labs  o Assessment: Lipid panel updated after 4 weeks on statin. LDL 74, slightly above goal, but would prefer to stay on low dose given age and history of LFT elevations on high dose.  . Patient self care activities - Over the next 6 months, patient will: o Continue current medications   Additional recommendations: . Recommend total  daily intake of vitamin D3 800 IU and 1200 mg calcium through dietary intake and supplementation to reduce risk of fractures and improve bone health.  Medication management . Pharmacist Clinical Goal(s): o Over the next 6 months, patient will work with PharmD and providers to maintain optimal medication adherence . Current pharmacy: [facility] . Interventions o Discussed adherence packaging and pharmacist coordination of delivery o Will deliver a sample packaging from [facility] for patient to try out  . Patient self care activities: o Take medications as prescribed o Report any questions or concerns to PharmD and/or provider(s)  Please see past updates related to this goal by clicking on the "Past Updates" button in the selected goal        CAD/History of CVA   Lipid Panel     Component Value Date/Time   CHOL 140 09/13/2019 1518   CHOL 121 07/06/2014 0427   TRIG 84.0 09/13/2019 1518   TRIG 125 07/06/2014 0427   HDL 48.50 09/13/2019 1518   HDL 47 07/06/2014 0427   CHOLHDL 3 09/13/2019 1518   VLDL 16.8 09/13/2019 1518   VLDL 25 07/06/2014 0427   LDLCALC 74 09/13/2019 1518   LDLCALC 49 07/06/2014 0427   LDLDIRECT 142.7 02/07/2014 1611   CMP Latest Ref Rng & Units 09/13/2019 03/02/2019 03/01/2019  Glucose 70 - 99 mg/dL 91 123(H) 122(H)  BUN 6 - 23 mg/dL 19 15 15   Creatinine 0.40 - 1.50 mg/dL 0.98 0.85 0.92  Sodium 135 - 145 mEq/L 138 141 137  Potassium 3.5 - 5.1 mEq/L 4.4 4.1 4.1  Chloride 96 - 112 mEq/L 103 108 103  CO2 19 - 32 mEq/L 27 25 24   Calcium 8.4 - 10.5 mg/dL 9.7 9.1 9.5  Total Protein 6.0 - 8.3 g/dL 7.5 - 7.2  Total Bilirubin 0.2 - 1.2 mg/dL 0.9 - 1.1  Alkaline Phos 39 - 117 U/L 68 - 61  AST 0 - 37 U/L 39(H) - 32  ALT 0 - 53 U/L 31 - 30  Lipid Panel      LDL goal < 70 Patient has failed these meds in past: atorvastatin 40 mg (elevated LFTs) Patient is currently controlled on the following medications:  Aspirin 81 mg - once daily  Rosuvastatin 5  mg - once daily (started 08/13/19)  We discussed: continues to tolerate low dose statin Assessment: Lipid panel updated after 4 weeks on statin. LDL 74, slightly above goal, but would prefer to stay on low dose given age and history of LFT elevations on high dose.   Plan: Continue current medications  Major Depressive Disorder   Patient has failed these meds in past: none Patient is currently controlled on the following medications:  Citalopram 20 mg - 1 tablet once daily  Bupropion 150 mg - 1 tablet twice daily  We discussed: per wife, mood is stable right now  Plan: Continue current medications  Vertigo   Patient has failed these meds in past: none Patient is currently controlled on the following medications:  Meclizine 25 mg - 1 tablet TID PRN  Diazepam 2 mg - 1-2 tablets TID PRN   We discussed: pt reports he has not taken any recently, no vertigo episodes  Plan: Continue current medications  Osteoarthritis    Location: knees Patient has failed these meds in past: none Patient is currently controlled on the following medications:   OTC Tylenol - as needed  Move-Free PO (glucosamine-chondroitin) - 2 tablets daily  Plan: Continue current medications  Acid reflux   Patient has failed these meds in past: none Patient is currently controlled on the following medications:   Pantoprazole 40 mg - once daily  We discussed: Wife reports pantoprazole started 2020 for difficulty swallowing/choking due to stroke/paralyzed left side. Pt is doing well on current therapy.   Plan: Continue current medications  Sleep Disturbance   Patient has failed these meds in past: none Patient is currently controlled on the following medications:   Melatonin 5 mg - once daily at bedtime as needed  We discussed: working well, taking PRN  Plan: Continue current medications  Fall Risk/Bone Health   Patient has failed these meds in past: none Patient is currently on the  following medications:   No pharmacotherapy  Multivitamin   Plan:  Recommend daily intake of vitamin D3 800 IU and 1200 mg calcium through dietary intake and supplements if needed  Medication Management  OTCs: multivitamin  [facility] --> interested in packaging/delivery services, wants to see packaging first to see if he can manipulate with one hand (will deliver a sample package)  Adherence: pillbox, no concerns  Affordability: no concerns   CCM Follow Up:  6 months, telephone visit   [name], PharmD Clinical Pharmacist [facility] at [facility] [phone]

## 2020-02-03 IMAGING — MR MR HEAD W/O CM
11 series · 44 of 48 positions shown · non-contrast
Comparison: Head CT without contrast earlier today. Brain MRI
07/05/2014.

CLINICAL DATA: 73-year-old male with dizziness.

EXAM:
MRI HEAD WITHOUT CONTRAST
TECHNIQUE: Multiplanar, multiecho pulse sequences of the brain and surrounding
structures were obtained without intravenous contrast.

[Series 2: ax dwi_tracew · axial · 3.0mm · 1.31mm/px · z∈[-31,+123]mm · 6 of 48 slices shown]
[im 1/48]
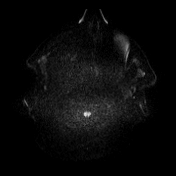
[im 10/48]
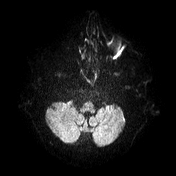
[im 19/48]
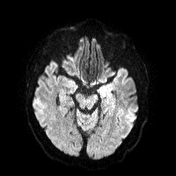
[im 29/48]
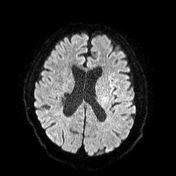
[im 38/48]
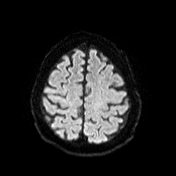
[im 48/48]
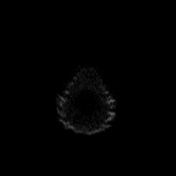

[Series 3: ax dwi_adc · axial · 3.0mm · 1.31mm/px · z∈[-31,+123]mm · 6 of 48 slices shown]
[im 1/48]
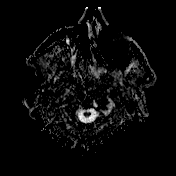
[im 10/48]
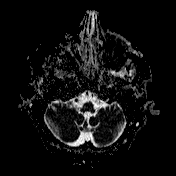
[im 19/48]
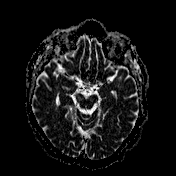
[im 29/48]
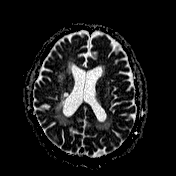
[im 38/48]
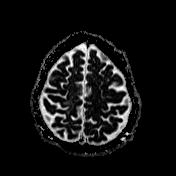
[im 48/48]
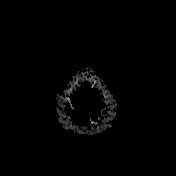

[Series 4: cor dwi_tracew · coronal · 5.0mm · 1.31mm/px · 5 of 42 slices shown]
[im 1/42]
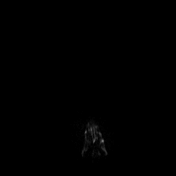
[im 11/42]
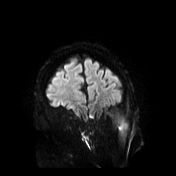
[im 21/42]
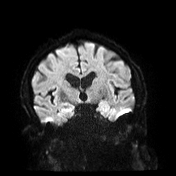
[im 31/42]
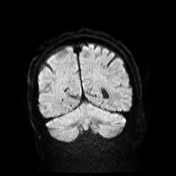
[im 42/42]
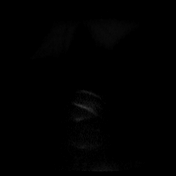

[Series 5: cor dwi_adc · coronal · 5.0mm · 1.31mm/px · 5 of 42 slices shown]
[im 1/42]
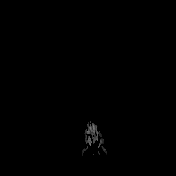
[im 11/42]
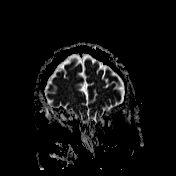
[im 21/42]
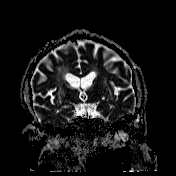
[im 31/42]
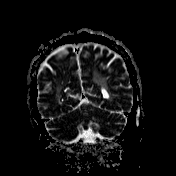
[im 42/42]
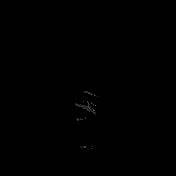

[Series 6: T2 · axial · 5.0mm · 0.45mm/px · z∈[-33,+123]mm · 3 of 27 slices shown (1 of 2)]
[im 1/27]
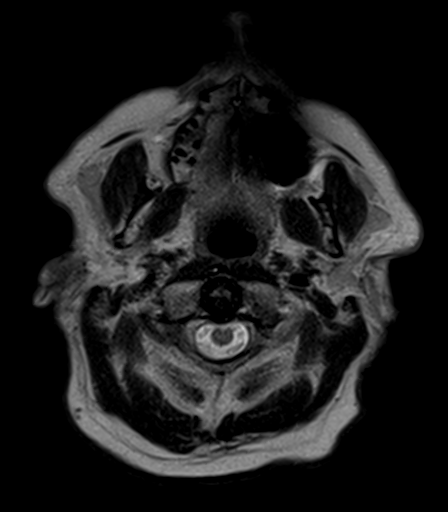
[im 14/27]
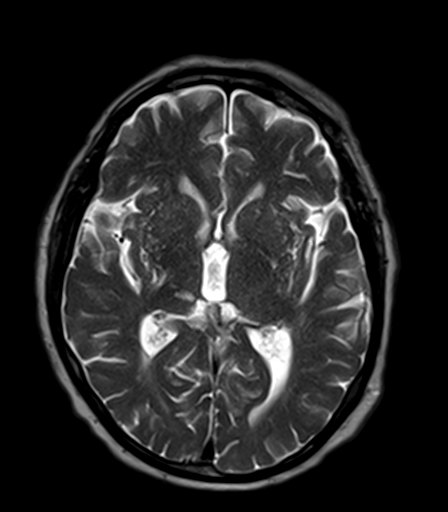
[im 27/27]
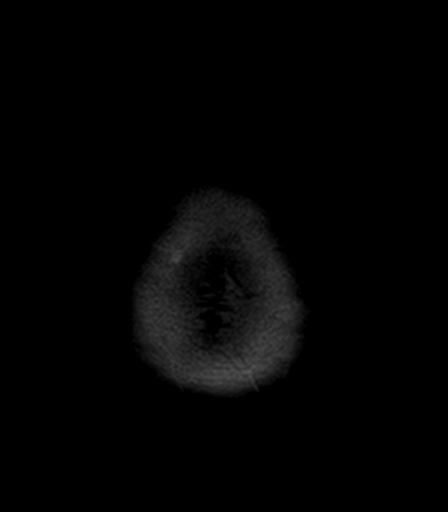

[Series 7: T2-star · axial · 5.0mm · 0.45mm/px · z∈[-33,+123]mm · 3 of 27 slices shown]
[im 1/27]
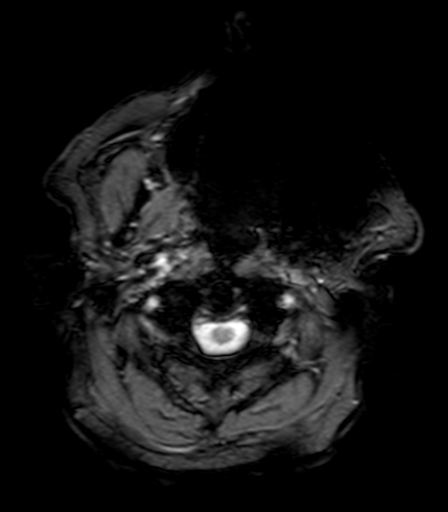
[im 14/27]
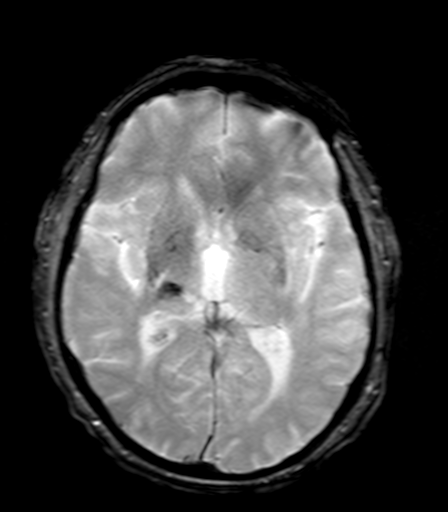
[im 27/27]
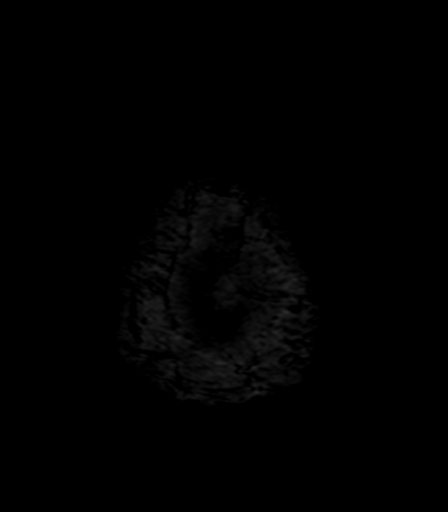

[Series 8: FLAIR · axial · 5.0mm · 1.20mm/px · z∈[-34,+122]mm · 3 of 27 slices shown]
[im 1/27]
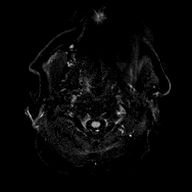
[im 14/27]
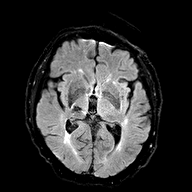
[im 27/27]
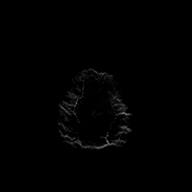

[Series 9: T1 · axial · 5.0mm · 0.90mm/px · z∈[-33,+123]mm · 3 of 27 slices shown (1 of 2)]
[im 1/27]
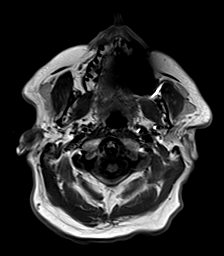
[im 14/27]
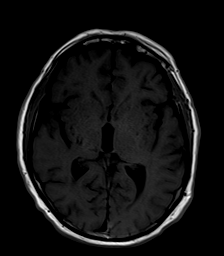
[im 27/27]
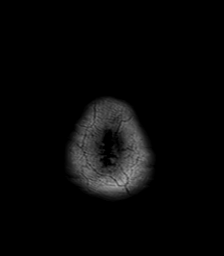

[Series 10: T2 · coronal · 5.0mm · 0.45mm/px · 4 of 33 slices shown (2 of 2)]
[im 1/33]
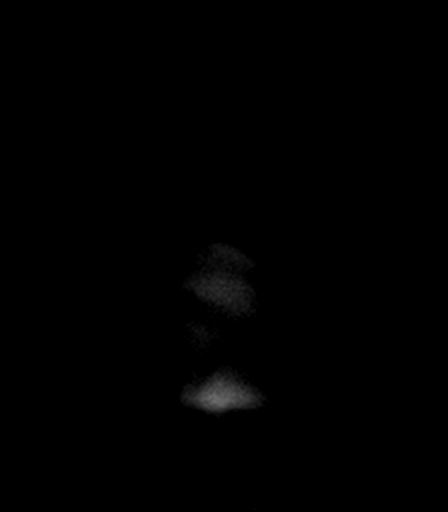
[im 11/33]
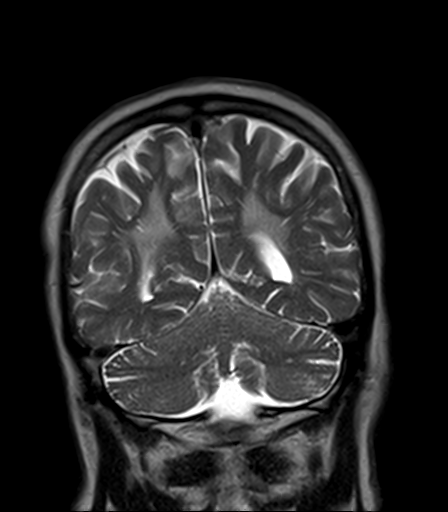
[im 22/33]
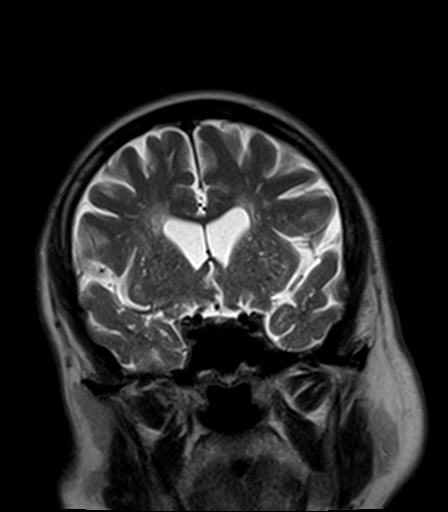
[im 33/33]
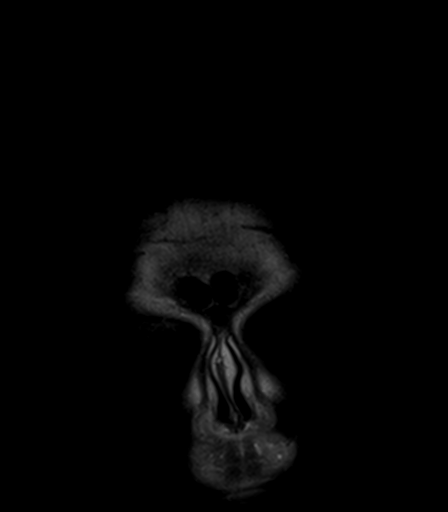

[Series 11: T1 · sagittal · 5.0mm · 0.94mm/px · 3 of 23 slices shown (2 of 2)]
[im 1/23]
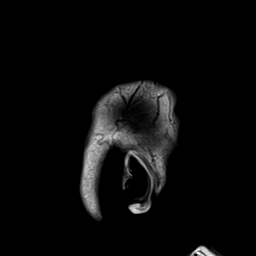
[im 12/23]
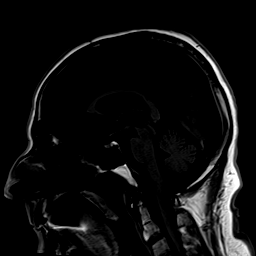
[im 23/23]
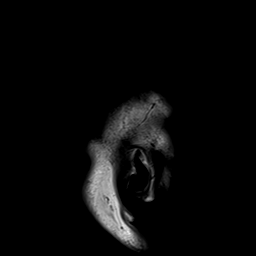

[Series 12: iac thins · axial · 0.6mm · 0.30mm/px · z∈[-16,-5]mm · 3 of 60 slices shown]
[im 1/60]
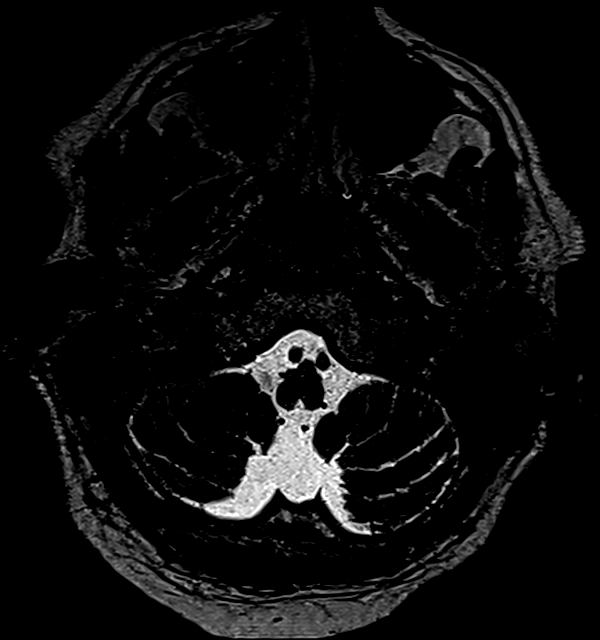
[im 10/60]
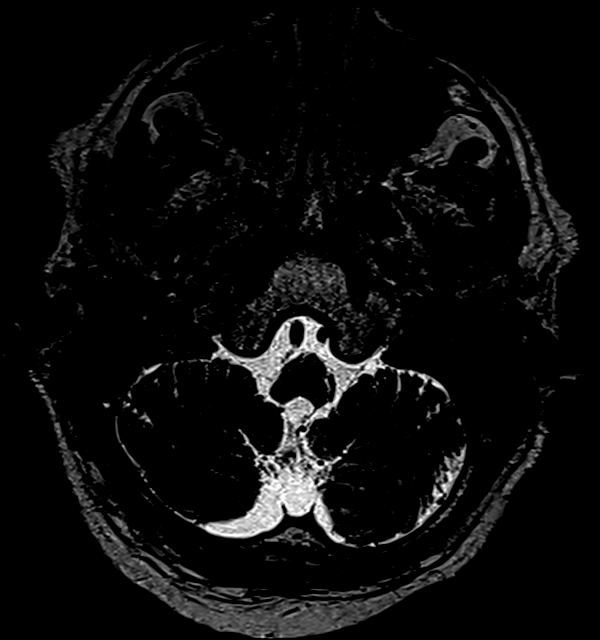
[im 20/60]
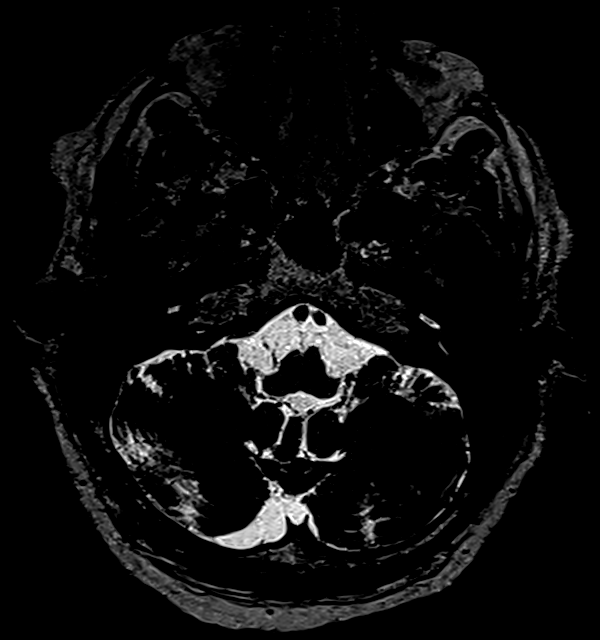

[44 of 48 positions shown; findings below may reference images not displayed]

FINDINGS: Brain: No restricted diffusion or evidence of acute infarction.

Chronic hemorrhagic infarct of the posterior right corona radiata
and thalamus with expected evolution since 7885. Subsequent
brainstem Wallerian degeneration has developed.

Progressed bilateral cerebral white matter T2 and FLAIR
hyperintensity since 7885, now confluent in both hemispheres.
Bilateral deep white matter capsule involvement.

No midline shift, mass effect, evidence of mass lesion,
ventriculomegaly, extra-axial collection or acute intracranial
hemorrhage. Cervicomedullary junction and pituitary are within
normal limits.

Vascular: Major intracranial vascular flow voids are stable since
7885.

Skull and upper cervical spine: Negative for age visible cervical
spine. Normal bone marrow signal.

Sinuses/Orbits: Stable and negative.

Other: Mastoids remain clear. Visible internal auditory structures
appear stable and within normal limits. Scalp and face soft tissues
appear negative.
IMPRESSION: 1. No acute intracranial abnormality.
2. Chronic hemorrhagic infarct of the right corona radiata and
thalamus with Wallerian degeneration since [DATE]. Progressed signal abnormality in the bilateral cerebral white
matter since 7885, favor small vessel disease related.

## 2020-03-15 ENCOUNTER — Ambulatory Visit (INDEPENDENT_AMBULATORY_CARE_PROVIDER_SITE_OTHER): Payer: PPO

## 2020-03-15 ENCOUNTER — Other Ambulatory Visit: Payer: Self-pay

## 2020-03-15 DIAGNOSIS — Z23 Encounter for immunization: Secondary | ICD-10-CM

## 2020-03-29 ENCOUNTER — Telehealth: Payer: Self-pay | Admitting: Internal Medicine

## 2020-03-29 NOTE — Telephone Encounter (Signed)
Patient's wife called in needing an appointment to discuss PT home health for patient asap. Did advise patient of next available and did go over message with patient's wife if husband is getting worse to take to Er as Dr.Letvak had advised previously. Patient's wife became very upset and irate stated she does not know if after this they will return to the office. Patient's wife wanted to inform team she is upset and feels like since they have been patients for years this is not the treatment they deserve. Wife did cancel her own appointment to get patient seen sooner and refused her own reschedule. Please advise.

## 2020-03-29 NOTE — Telephone Encounter (Signed)
The notes below are from the Hall's wife's chart that she used to communicate about the Hall and her requests for the Hall. Those notes were copied and pasted into this note.      March 28, 2020 Keith Hall Hall to Keith Carbon, MD     4:57 PM Keith Hall call the office tomorrow. Keith Hall, CMA to [place], Keith Hall     4:52 PM What Dr Silvio Pate means is we have to physically see him during a visit either in the office or during a virtual visit. You can call the office in the morning and see if another provider could see him or you will have to go the route of the ER as Dr Silvio Pate has mentioned.   Last read by Keith Hall [place] at 4:56 PM on 03/28/2020. Keith Hall, Keith Hall to Keith Carbon, MD     4:46 PM Yes he can move some. Right leg very shaky and balance is slight. He has tried a Environmental consultant but left hand cannot hold to it. Therefore, he only can use a cane. I made videos to send my son so can send to you. Dark but we can also face time which we did last time before you sent in an order to [place]. Keith Hall, Keith Hall to Keith Carbon, MD     4:41 PM Do you guys have a number or email address where I can send you videos of him? Keith Hall, Keith Hall to Keith Carbon, MD     4:40 PM 1. [facility]  2 Hall at home 3.[facility] 4. [facility] Keith Carbon, MD to Keith Hall Hall     4:25 PM Home health is not covered without a face to face visit with a physician or other provider to document what is going on and why Hall is needed. I cannot just order home Hall without seeing him. If he really can't even walk around, I am afraid that going to the ER may be the only alternative. If he can get around some/transfer and move around in wheelchair---it may be reasonable to keep him home (but he will need to be seen to set up home services).  Last read by Keith Hall Christina at 4:56 PM on 03/28/2020. Keith Hall, CMA to [place], Keith Hall     4:09  PM What haome Hall agencies did they say were covered? We have seen delays in some home care also.   Last read by Keith Hall Leitzel at 4:56 PM on 03/28/2020. Keith Hall, Keith Hall to Keith Carbon, MD     3:53 PM Problem is Keith Hall Therapy cannot see him, It's been recommended to seek home therapy. I now have several agencies , in network, who will have to get a script from the Dr.  I didn't mean to actually call at 74, but after. Anytime is fine. If waiting to [place] this next week, maybe I can get some help till then. Sorry to be so persistent but getting desperate here. Keith Keith Hall, [place] to [place], Keith Hall     3:42 PM I will do what I can. I cannot promise that I can call you at 10, but I will do my best. I am not sure what I am doing tomorrow with him not being here. I may be working in the lab. As you know, he is not in the office. I think he has been as proactive as he  could be out of the office by starting the referral as requested. Hopefully there is something another provider can help do in his absence.  Last read by Keith Hall Keith Hall at 4:36 PM on 03/28/2020. Keith Hall, Keith Hall to Keith Carbon, MD     3:34 PM Hello Keith Hall, this thing has gotten so difficult, much more than I expected. I talked with HTA agent today, who gave me a lot of info. Dr L will have to be proactive in getting something done. Would you please call me tomorrow after 10am so I can relay info to you? Way too much to write.  Thank you, Keith     11:20 AM Keith Shoe, LPN routed this conversation to Keith Carbon, MD       11:12 AM Keith Shoe, LPN contacted Keith Hall Keith Arnt, LPN     35:00 AM Note Mrs Keith Hall left v/m requesting cb about urgent message; I spoke with Hall's wife and Keith Hall Hall is backed up for several months for new pts referral and Mrs Keith Hall wants to know what Dr Silvio Pate would suggest about another Hall to refer Hall to; Keith Hall Hall advised Keith Hall  [phone] and [facility] [phone] and [facility] [phone]. Will Dr Silvio Pate go ahead and put in new referral order to a Hall so Hall can get started on Hall. Mrs Keith Hall request cb ASAP.        11:11 AM Keith Hall contacted Keith Shoe, LPN   March 27, 174     5:02 PM Keith Hall, CMA routed this conversation to Keith Carbon, MD  Keith Hall Keith Hall to Keith Carbon, MD     5:01 PM His thinking is clear, he responds to any verbal communication we give, correctly, and no signs of stroke. I have watched him very closely and had family in to observe. We just believe his right side has just about worn out from carrying his left side for 6 yrs. his hand and arm, on right seem ok , it's his leg that he has great difficulty with. We're more than ready to bring him into your office if you can tell us who to see. Should I call Keith Hall or will they call me? Thank you, so much for getting back to me and I apologize for disturbing your vacation. Keith Keith Carbon, MD to Keith Hall Hall     4:52 PM I have put in a referral for Hall at [facility] and asked that it get handled ASAP. If he is having trouble functioning at home, you may need to bring him to the ER just to be sure there wasn't a stroke or other cause for his decline. Generally admission to a skilled facility happens from the hospital (with a few exceptions that most depend on your insurance). You may want to call [facility] and explain the situation---they will sometimes allow a waiver to get someone rehab---but they almost certainly won't do it without a doctor's evaluation.  Last read by Keith Hall Keith Hall at 4:36 PM on 03/28/2020.     2:07 PM Keith Hall, CMA routed this conversation to Keith Carbon, MD  Keith Hall to Keith Carbon, MD     2:02 PM A Rx for Hall at [facility]. Right now Keith Hall can barely walk. His right side (good side) has no strength. He's  almost non functional. The longer we wait , I'm afraid  the weaker he'll get Keith Hall, CMA to Keith Hall, Keith Hall     1:51 PM For what reason? A referral to Hall or something more? You are more than welcome to call the office to try and get him in to be seen by someone. There is not one person in particular that sees his patients when he is out.  Last read by Keith Hall Keith Hall at 3:47 PM on 03/28/2020.     12:32 PM Keith Hall, CMA routed this conversation to Keith Hall, Keith Hall, Keith Hall to Keith Carbon, MD     12:28 PM Keith Hall, my son wants to know if it would do any good to see who ever is seeing Dr L's patients in his absence.     9:28 AM Keith Hall, CMA routed this conversation to Keith Carbon, MD  Keith Hall to Keith Carbon, MD     9:14 AM Talk to him and let me k now. I really do not want to go the hospital route. Keith Hall needs help ASAP. Maybe Keith Hall order for Hall to start.     8:24 AM Keith Hall, CMA routed this conversation to Keith Carbon, MD  Keith Hall, CMA to Keith Hall, Keith Hall     8:24 AM Dr Silvio Pate is out of the office this week. He does not have the authority to direct admit anyone into a facility. Unfortunately, he is not here to do an FL2 form. The only thing I can think is him going to the hospital and then being placed from there. I will certainly forward this message to Dr Silvio Pate to let him know as he is periodically checking his messages.    Last read by Keith Hall Keith Hall at 4:53 PM on 03/27/2020. March 26, 2020 Keith Hall Hall to Keith Carbon, MD     5:53 PM Keith Hall has taken a bad turn for the worse. Onset was Wed. 10/6. I had our son and Bobs brother here today to also assess his condition. We all agree that he needs in facility rehab where he can get strenuous Hall. Please relay my message to Dr Silvio Pate and get back to me with what we need to do. I cannot handle him, myself so  I will have to get other family to do whatever is required. Nothing today 10/11. Thank you for attention to this matter ASAP.  Keith

## 2020-03-29 NOTE — Telephone Encounter (Signed)
Unfortunately, we reached out to other providers but their schedules are full for 30 minute visits also. Dr Silvio Pate did what was requested for out pt PT but they were scheduling out several months. The next request was for Home PT which requires a Face to Face encounter. This usually has to be with the PCP who is out of the office this week. He addressed this through a MyChart message in the wife's chart. I will forward this to our clinical manager.

## 2020-03-29 NOTE — Telephone Encounter (Signed)
Contacted pt's wife, June, and inquired if pt was taken to ER. June reports she would not be taking pt to ER and that she had to go to the ER not long ago and she would not put him through that. She said she is not taking him to the ER to have unnecessary tests like MRI and CT's when she knows the pt just has muscle atrophy from trying to carry his entire weight on one side for some many years since the stroke. She said she cannot help him an longer because she has recently had surgery and he is having more difficulty getting up off of the couch and she is not able to help him up. She said he is still able to ambulate it just takes a while but she cannot help pull him off of the couch. She is upset and stated "I feel like we have been abandoned by your practice when we need you most. " She said she understands Dr. Silvio Pate is on vacation but there should be another physician able to see the pt and order PT. Advised pt right now there were no opening today and no opening tomorrow. She said she worked in a [employer]il [employer] for 50 years and they always saw emergencies and we should also. Advised pt we are also full to capacity with emergencies also. Advised pt that PCP cares for his pts and does advise it may be best for pt to go to ER so he can either be admitted to facility for care of get referral for PT so he can get immediate help. Advised Dr. Silvio Pate would only advise what is best for pt. June agreed but said she is also advocating for the pt and she will not take him to the ER and make him wait like she had too. She said she has given up her apt with Dr.Letvak for next Wed and given it to her husband and if they have to wait until then then they would. She said if there were any openings this week with another provider her son is coming over tomorrow at 10 am and her could help bring him to the office. And on Friday his brother may be able to help bring him. Advised this nurse would f/u to see if there were any  cancellations and if other providers could see him and were able to order the PT. June apologized for being rude to this nurse at the beginning of the conversation and for the person she spoke with on the phone at the clinic earlier today. Advised it was ok and that she was advocating for her husband and it was alright but we appreciated her apology. Advised if any apts came open in the next two days this office would f/u but if needed please take the pt to the ER. June verbalized understanding but said they could wait until Wed if needed and she would not take him to the ER.   She asked if she was called to contact her on her cell at [phone]

## 2020-03-30 NOTE — Telephone Encounter (Signed)
Left message to call office

## 2020-03-30 NOTE — Telephone Encounter (Signed)
I will add him on on Monday at 1:30PM if there is someone to check him in

## 2020-03-30 NOTE — Telephone Encounter (Signed)
Advised June, pt's wife,  pt's apt is moved to Monday at 130. June appreciative and verbalized understanding.   RS apt for MOnday

## 2020-03-31 ENCOUNTER — Telehealth: Payer: Self-pay | Admitting: Internal Medicine

## 2020-03-31 NOTE — Telephone Encounter (Signed)
Referral for PT was placed under the patent's Wife June -- this needs to be placed under this patient.  I have closed the other referral.   Please let me know once referral is placed.   I am going to try and get him in with [facility] or [facility]

## 2020-03-31 NOTE — Telephone Encounter (Signed)
I thought he needed home PT and that is why we were fitting him in Monday. You don't need face to face for outpatient therapy

## 2020-03-31 NOTE — Telephone Encounter (Signed)
Are we going to try and get him Home PT or are we still trying Outpt PT?

## 2020-04-01 NOTE — Telephone Encounter (Signed)
Spoke to wife. They want Home Health PT.

## 2020-04-03 ENCOUNTER — Encounter: Payer: Self-pay | Admitting: Internal Medicine

## 2020-04-03 ENCOUNTER — Ambulatory Visit (INDEPENDENT_AMBULATORY_CARE_PROVIDER_SITE_OTHER): Payer: PPO | Admitting: Internal Medicine

## 2020-04-03 ENCOUNTER — Other Ambulatory Visit: Payer: Self-pay

## 2020-04-03 VITALS — BP 116/78 | HR 83 | Temp 97.2°F | Ht 69.0 in | Wt 196.8 lb

## 2020-04-03 DIAGNOSIS — I69359 Hemiplegia and hemiparesis following cerebral infarction affecting unspecified side: Secondary | ICD-10-CM | POA: Diagnosis not present

## 2020-04-03 DIAGNOSIS — I69398 Other sequelae of cerebral infarction: Secondary | ICD-10-CM

## 2020-04-03 DIAGNOSIS — I69341 Monoplegia of lower limb following cerebral infarction affecting right dominant side: Secondary | ICD-10-CM | POA: Diagnosis not present

## 2020-04-03 DIAGNOSIS — F0631 Mood disorder due to known physiological condition with depressive features: Secondary | ICD-10-CM | POA: Diagnosis not present

## 2020-04-03 MED ORDER — BUPROPION HCL ER (SR) 150 MG PO TB12: ORAL_TABLET | ORAL | 3 refills | Status: AC

## 2020-04-03 NOTE — Progress Notes (Signed)
Subjective:    Patient ID: Keith Hall, male    DOB: 06-11-1946, 74 y.o.   MRN: [id number]  HPI Here with wife due to change in status--not able to walk around like he did This visit occurred during the SARS-CoV-2 public health emergency.  Safety protocols were in place, including screening questions prior to the visit, additional usage of staff PPE, and extensive cleaning of exam room while observing appropriate contact time as indicated for disinfecting solutions.   "I feel crappy, tired all the time" Wife notes he can't walk now---trouble getting out of chair Stumbling a lot Still gets around with cane---and holds onto walls, etc Leading with his weak foot---the right foot is not working right now This all started 12 days ago  Right hand is fine No facial droop No dysphagia Speech seems okay  Current Outpatient Medications on File Prior to Visit  Medication Sig Dispense Refill  . acetaminophen (TYLENOL) 500 MG tablet Take 500 mg by mouth 3 (three) times daily as needed.     Marland Kitchen aspirin 81 MG chewable tablet Chew 81 mg by mouth daily.    Marland Kitchen buPROPion (WELLBUTRIN SR) 150 MG 12 hr tablet TAKE 1 TABLET (150 MG TOTAL) BY MOUTH 2 (TWO) TIMES DAILY EVERY MORNING/AFTER LUNCH 180 tablet 3  . citalopram (CELEXA) 20 MG tablet TAKE 1 TABLET (20 MG TOTAL) BY MOUTH DAILY. 90 tablet 3  . diazepam (VALIUM) 2 MG tablet Take 1-2 tablets (2-4 mg total) by mouth 3 (three) times daily as needed (vertigo). 30 tablet 0  . Glucosamine-Chondroitin (MOVE FREE PO) Take 2 tablets by mouth daily.    . meclizine (ANTIVERT) 25 MG tablet Take 1 tablet (25 mg total) by mouth 3 (three) times daily as needed for dizziness or nausea. 90 tablet 3  . Melatonin 3-10 MG TABS Take by mouth at bedtime.    . Multiple Vitamin (MULTIVITAMIN) tablet Take 2 tablets by mouth daily.     . pantoprazole (PROTONIX) 40 MG tablet TAKE 1 TABLET BY MOUTH EVERY DAY 90 tablet 3  . rosuvastatin (CRESTOR) 5 MG tablet Take 1 tablet (5 mg  total) by mouth daily. 90 tablet 3   No current facility-administered medications on file prior to visit.    Allergies  Allergen Reactions  . Atorvastatin Other (See Comments)    Elevated LFTs  . Penicillins Hives, Swelling and Rash    Did it involve swelling of the face/tongue/throat, SOB, or low BP? Yes Did it involve sudden or severe rash/hives, skin peeling, or any reaction on the inside of your mouth or nose? Yes Did you need to seek medical attention at a hospital or doctor's office? Yes When did it last happen?15 years If all above answers are "NO", may proceed with cephalosporin use.    Past Medical History:  Diagnosis Date  . BPH (benign prostatic hypertrophy)   . Depression 1980's04/06/2002   MDD---even needed hospitalization  . 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 Kidney stone 1980 only  . Osteoarthritis of both knees   . Stroke ([place])   . Sudden cardiac death [facility]) 09-16-99   no clear etiology of this    Past Surgical History:  Procedure Laterality Date  . CARDIAC CATHETERIZATION  last in 09-16-07   insignificant coronary disease  . CARDIOVASCULAR STRESS TEST  07/09/11   Negative--Dr Paraschos  . HERNIA REPAIR  16-Sep-2010   left inguinal--Dr Byrnett  . ROTATOR CUFF  REPAIR  2009   both done by Dr Margaretmary Eddy  . TOTAL KNEE ARTHROPLASTY Right 2/14   Dr Marry Guan  . TOTAL KNEE ARTHROPLASTY Left 1/15   Dr [facility]    Family History  Problem Relation Age of Onset  . Stroke Mother   . COPD Sister   . Diabetes Son        Type 1  . Heart disease Neg Hx   . Hypertension Neg Hx     Social History   Socioeconomic History  . Marital status: Married    Spouse name: Not on file  . Number of children: 1  . Years of education: Not on file  . Highest education level: Not on file  Occupational History  . Occupation: Outside Oncologist    Comment: Retired at 33  [employer]  . Smoking status: Never Smoker   . Smokeless tobacco: Never Used  Substance and Sexual Activity  . Alcohol use: No  . Drug use: No  . Sexual activity: Not on file  Other Topics Concern  . Not on file  Social History Narrative   1 son   Has living will   Wife, then son, have health care POA   Would accept resuscitation attempts but no prolonged artificial ventilation   Would not want tube feeds if cognitively unaware   Social Determinants of Health   Financial Resource Strain:   . Difficulty of Paying Living Expenses: Not on file  Food Insecurity:   . Worried About Charity fundraiser in the Last Year: Not on file  . Ran Out of Food in the Last Year: Not on file  Transportation Needs:   . Lack of Transportation (Medical): Not on file  . Lack of Transportation (Non-Medical): Not on file  Physical Activity:   . Days of Exercise per Week: Not on file  . Minutes of Exercise per Session: Not on file  Stress:   . Feeling of Stress : Not on file  Social Connections:   . Frequency of Communication with Friends and Family: Not on file  . Frequency of Social Gatherings with Friends and Family: Not on file  . Attends Religious Services: Not on file  . Active Member of Clubs or Organizations: Not on file  . Attends Archivist Meetings: Not on file  . Marital Status: Not on file  Intimate Partner Violence:   . Fear of Current or Ex-Partner: Not on file  . Emotionally Abused: Not on file  . Physically Abused: Not on file  . Sexually Abused: Not on file   Review of Systems No fever Some cough---lots of phlegm (mostly with eating) Slight vague lateral chest pain---just a few times (but not in the past 6 weeks) Some dizziness---can be with sitting or walking    Objective:   Physical Exam Constitutional:      Appearance: Normal appearance.  Cardiovascular:     Rate and Rhythm: Normal rate and regular rhythm.     Heart sounds: Normal heart sounds. No murmur heard.  No gallop.   Musculoskeletal:      Cervical back: Neck supple.     Right lower leg: No edema.     Left lower leg: No edema.     Comments: Fairly normal passive ROM in right hip No back tenderness  Lymphadenopathy:     Cervical: No cervical adenopathy.  Neurological:     Mental Status: He is alert.     Comments: Same left hand  weakness Right foot now lags behind left when walking ---no obvious pain but shuffles the right foot now Weakness more evident in right arm---can no longer push himself up from chair  Psychiatric:        Mood and Affect: Mood normal.        Behavior: Behavior normal.            Assessment & Plan:

## 2020-04-03 NOTE — Assessment & Plan Note (Signed)
This seems stable Is on statin and ASA

## 2020-04-03 NOTE — Assessment & Plan Note (Signed)
Will renew his bid bupropion

## 2020-04-03 NOTE — Telephone Encounter (Signed)
Noted, referral is in que to be processed.   Nothing further needed.

## 2020-04-03 NOTE — Assessment & Plan Note (Addendum)
No longer using his right leg properly Most likely a new CVA Left side still weak from stroke 2016 He notes fatigue---but not clear cut Will check labs and brain MRI If new findings on left side---will recheck carotids and echo  Home health for therapy Will need walker with left arm rest to facilitate safe movement in house

## 2020-04-04 LAB — COMPREHENSIVE METABOLIC PANEL
ALT: 53 U/L (ref 0–53)
AST: 75 U/L — ABNORMAL HIGH (ref 0–37)
Albumin: 4.8 g/dL (ref 3.5–5.2)
Alkaline Phosphatase: 69 U/L (ref 39–117)
BUN: 16 mg/dL (ref 6–23)
CO2: 31 mEq/L (ref 19–32)
Calcium: 9.9 mg/dL (ref 8.4–10.5)
Chloride: 103 mEq/L (ref 96–112)
Creatinine, Ser: 0.89 mg/dL (ref 0.40–1.50)
GFR: 82.79 mL/min (ref >60.00)
Glucose, Bld: 79 mg/dL (ref 70–99)
Potassium: 4.7 mEq/L (ref 3.5–5.1)
Sodium: 141 mEq/L (ref 135–145)
Total Bilirubin: 0.9 mg/dL (ref 0.2–1.2)
Total Protein: 7.3 g/dL (ref 6.0–8.3)

## 2020-04-04 LAB — LIPID PANEL
Cholesterol: 149 mg/dL (ref 0–200)
HDL: 50.6 mg/dL (ref >39.00)
LDL Cholesterol: 73 mg/dL (ref 0–99)
NonHDL: 98.59
Total CHOL/HDL Ratio: 3
Triglycerides: 130 mg/dL (ref 0.0–149.0)
VLDL: 26 mg/dL (ref 0.0–40.0)

## 2020-04-04 LAB — CBC
HCT: 42.1 % (ref 39.0–52.0)
Hemoglobin: 14.6 g/dL (ref 13.0–17.0)
MCHC: 34.6 g/dL (ref 30.0–36.0)
MCV: 91.1 fl (ref 78.0–100.0)
Platelets: 152 10*3/uL (ref 150.0–400.0)
RBC: 4.62 Mil/uL (ref 4.22–5.81)
RDW: 12.9 % (ref 11.5–15.5)
WBC: 6.1 10*3/uL (ref 4.0–10.5)

## 2020-04-04 LAB — SEDIMENTATION RATE: Sed Rate: 11 mm/hr (ref 0–20)

## 2020-04-04 LAB — VITAMIN B12: Vitamin B-12: 349 pg/mL (ref 211–911)

## 2020-04-04 LAB — T4, FREE: Free T4: 0.8 ng/dL (ref 0.60–1.60)

## 2020-04-05 ENCOUNTER — Ambulatory Visit: Payer: PPO | Admitting: Internal Medicine

## 2020-04-09 DIAGNOSIS — M170 Bilateral primary osteoarthritis of knee: Secondary | ICD-10-CM | POA: Diagnosis not present

## 2020-04-09 DIAGNOSIS — G479 Sleep disorder, unspecified: Secondary | ICD-10-CM | POA: Diagnosis not present

## 2020-04-09 DIAGNOSIS — N400 Enlarged prostate without lower urinary tract symptoms: Secondary | ICD-10-CM | POA: Diagnosis not present

## 2020-04-09 DIAGNOSIS — Z9181 History of falling: Secondary | ICD-10-CM | POA: Diagnosis not present

## 2020-04-09 DIAGNOSIS — I69341 Monoplegia of lower limb following cerebral infarction affecting right dominant side: Secondary | ICD-10-CM | POA: Diagnosis not present

## 2020-04-09 DIAGNOSIS — R32 Unspecified urinary incontinence: Secondary | ICD-10-CM | POA: Diagnosis not present

## 2020-04-09 DIAGNOSIS — Z96653 Presence of artificial knee joint, bilateral: Secondary | ICD-10-CM | POA: Diagnosis not present

## 2020-04-09 DIAGNOSIS — Z7901 Long term (current) use of anticoagulants: Secondary | ICD-10-CM | POA: Diagnosis not present

## 2020-04-09 DIAGNOSIS — F331 Major depressive disorder, recurrent, moderate: Secondary | ICD-10-CM | POA: Diagnosis not present

## 2020-04-13 DIAGNOSIS — F331 Major depressive disorder, recurrent, moderate: Secondary | ICD-10-CM | POA: Diagnosis not present

## 2020-04-13 DIAGNOSIS — M170 Bilateral primary osteoarthritis of knee: Secondary | ICD-10-CM | POA: Diagnosis not present

## 2020-04-13 DIAGNOSIS — R32 Unspecified urinary incontinence: Secondary | ICD-10-CM | POA: Diagnosis not present

## 2020-04-13 DIAGNOSIS — N400 Enlarged prostate without lower urinary tract symptoms: Secondary | ICD-10-CM | POA: Diagnosis not present

## 2020-04-13 DIAGNOSIS — Z96653 Presence of artificial knee joint, bilateral: Secondary | ICD-10-CM | POA: Diagnosis not present

## 2020-04-13 DIAGNOSIS — Z9181 History of falling: Secondary | ICD-10-CM | POA: Diagnosis not present

## 2020-04-13 DIAGNOSIS — Z7901 Long term (current) use of anticoagulants: Secondary | ICD-10-CM | POA: Diagnosis not present

## 2020-04-13 DIAGNOSIS — G479 Sleep disorder, unspecified: Secondary | ICD-10-CM | POA: Diagnosis not present

## 2020-04-13 DIAGNOSIS — I69341 Monoplegia of lower limb following cerebral infarction affecting right dominant side: Secondary | ICD-10-CM | POA: Diagnosis not present

## 2020-04-14 ENCOUNTER — Telehealth: Payer: Self-pay

## 2020-04-14 DIAGNOSIS — M170 Bilateral primary osteoarthritis of knee: Secondary | ICD-10-CM | POA: Diagnosis not present

## 2020-04-14 DIAGNOSIS — G479 Sleep disorder, unspecified: Secondary | ICD-10-CM | POA: Diagnosis not present

## 2020-04-14 DIAGNOSIS — R32 Unspecified urinary incontinence: Secondary | ICD-10-CM | POA: Diagnosis not present

## 2020-04-14 DIAGNOSIS — Z9181 History of falling: Secondary | ICD-10-CM | POA: Diagnosis not present

## 2020-04-14 DIAGNOSIS — Z96653 Presence of artificial knee joint, bilateral: Secondary | ICD-10-CM | POA: Diagnosis not present

## 2020-04-14 DIAGNOSIS — N400 Enlarged prostate without lower urinary tract symptoms: Secondary | ICD-10-CM | POA: Diagnosis not present

## 2020-04-14 DIAGNOSIS — I69341 Monoplegia of lower limb following cerebral infarction affecting right dominant side: Secondary | ICD-10-CM | POA: Diagnosis not present

## 2020-04-14 DIAGNOSIS — Z7901 Long term (current) use of anticoagulants: Secondary | ICD-10-CM | POA: Diagnosis not present

## 2020-04-14 DIAGNOSIS — F331 Major depressive disorder, recurrent, moderate: Secondary | ICD-10-CM | POA: Diagnosis not present

## 2020-04-14 NOTE — Telephone Encounter (Signed)
Keith Hall with [facility] called for verbal OT orders 1 x week for 1 week, 2 x week for 2 weeks then 1 x week for 1 week. Call orders to [phone]

## 2020-04-16 NOTE — Telephone Encounter (Signed)
That is okay.

## 2020-04-17 ENCOUNTER — Ambulatory Visit: Payer: PPO | Admitting: Internal Medicine

## 2020-04-17 DIAGNOSIS — I69341 Monoplegia of lower limb following cerebral infarction affecting right dominant side: Secondary | ICD-10-CM | POA: Diagnosis not present

## 2020-04-17 DIAGNOSIS — F331 Major depressive disorder, recurrent, moderate: Secondary | ICD-10-CM | POA: Diagnosis not present

## 2020-04-17 DIAGNOSIS — M170 Bilateral primary osteoarthritis of knee: Secondary | ICD-10-CM | POA: Diagnosis not present

## 2020-04-17 DIAGNOSIS — R32 Unspecified urinary incontinence: Secondary | ICD-10-CM | POA: Diagnosis not present

## 2020-04-17 DIAGNOSIS — G479 Sleep disorder, unspecified: Secondary | ICD-10-CM | POA: Diagnosis not present

## 2020-04-17 DIAGNOSIS — Z9181 History of falling: Secondary | ICD-10-CM | POA: Diagnosis not present

## 2020-04-17 DIAGNOSIS — N400 Enlarged prostate without lower urinary tract symptoms: Secondary | ICD-10-CM | POA: Diagnosis not present

## 2020-04-17 DIAGNOSIS — Z96653 Presence of artificial knee joint, bilateral: Secondary | ICD-10-CM | POA: Diagnosis not present

## 2020-04-17 DIAGNOSIS — Z7901 Long term (current) use of anticoagulants: Secondary | ICD-10-CM | POA: Diagnosis not present

## 2020-04-17 NOTE — Telephone Encounter (Signed)
Verbal orders left on verified VM for [facility].

## 2020-04-18 ENCOUNTER — Telehealth: Payer: Self-pay

## 2020-04-18 DIAGNOSIS — Z7901 Long term (current) use of anticoagulants: Secondary | ICD-10-CM | POA: Diagnosis not present

## 2020-04-18 DIAGNOSIS — I69341 Monoplegia of lower limb following cerebral infarction affecting right dominant side: Secondary | ICD-10-CM | POA: Diagnosis not present

## 2020-04-18 DIAGNOSIS — Z9181 History of falling: Secondary | ICD-10-CM | POA: Diagnosis not present

## 2020-04-18 DIAGNOSIS — R32 Unspecified urinary incontinence: Secondary | ICD-10-CM | POA: Diagnosis not present

## 2020-04-18 DIAGNOSIS — M170 Bilateral primary osteoarthritis of knee: Secondary | ICD-10-CM | POA: Diagnosis not present

## 2020-04-18 DIAGNOSIS — N400 Enlarged prostate without lower urinary tract symptoms: Secondary | ICD-10-CM | POA: Diagnosis not present

## 2020-04-18 DIAGNOSIS — G479 Sleep disorder, unspecified: Secondary | ICD-10-CM | POA: Diagnosis not present

## 2020-04-18 DIAGNOSIS — Z96653 Presence of artificial knee joint, bilateral: Secondary | ICD-10-CM | POA: Diagnosis not present

## 2020-04-18 DIAGNOSIS — F331 Major depressive disorder, recurrent, moderate: Secondary | ICD-10-CM | POA: Diagnosis not present

## 2020-04-18 NOTE — Telephone Encounter (Signed)
Hello, Keith Hall has recently , last couple weeks, begun to lose control of his bladder. Says he doesn't feel the need to go but when he gets up he just released the urine. My mom took a me d to help with this type issue. Can Dr Silvio Pate prescribe something for Keith Hall that will help [place]?  We have apts for booster shots Friday , I'm thinking of canceling due to length of time away from a bathroom. Do you know of any. Place doing a drive thru for [facility]? TY, June

## 2020-04-19 NOTE — Telephone Encounter (Signed)
Bladder control medications are usually not a good idea for men---they can lead to urinary retention and other side effects. We may want to set up with a urologist to see if anything reversible is causing this. It would be best to wear a protective brief, void right before leaving and still get the booster

## 2020-04-19 NOTE — Telephone Encounter (Signed)
Spoke to pt's wife. She will hold off on urology for now. Appreciated the call.

## 2020-04-20 ENCOUNTER — Other Ambulatory Visit: Payer: Self-pay

## 2020-04-20 ENCOUNTER — Ambulatory Visit
Admission: RE | Admit: 2020-04-20 | Discharge: 2020-04-20 | Disposition: A | Discharge status: Home / Self Care | Payer: PPO | Source: Ambulatory Visit | Attending: Internal Medicine | Admitting: Internal Medicine

## 2020-04-20 DIAGNOSIS — I6782 Cerebral ischemia: Secondary | ICD-10-CM | POA: Diagnosis not present

## 2020-04-20 DIAGNOSIS — R32 Unspecified urinary incontinence: Secondary | ICD-10-CM | POA: Diagnosis not present

## 2020-04-20 DIAGNOSIS — G319 Degenerative disease of nervous system, unspecified: Secondary | ICD-10-CM | POA: Diagnosis not present

## 2020-04-20 DIAGNOSIS — Z7901 Long term (current) use of anticoagulants: Secondary | ICD-10-CM | POA: Diagnosis not present

## 2020-04-20 DIAGNOSIS — N400 Enlarged prostate without lower urinary tract symptoms: Secondary | ICD-10-CM | POA: Diagnosis not present

## 2020-04-20 DIAGNOSIS — I69341 Monoplegia of lower limb following cerebral infarction affecting right dominant side: Secondary | ICD-10-CM | POA: Diagnosis not present

## 2020-04-20 DIAGNOSIS — R270 Ataxia, unspecified: Secondary | ICD-10-CM | POA: Diagnosis not present

## 2020-04-20 DIAGNOSIS — Z9181 History of falling: Secondary | ICD-10-CM | POA: Diagnosis not present

## 2020-04-20 DIAGNOSIS — I619 Nontraumatic intracerebral hemorrhage, unspecified: Secondary | ICD-10-CM | POA: Diagnosis not present

## 2020-04-20 DIAGNOSIS — F331 Major depressive disorder, recurrent, moderate: Secondary | ICD-10-CM | POA: Diagnosis not present

## 2020-04-20 DIAGNOSIS — Z96653 Presence of artificial knee joint, bilateral: Secondary | ICD-10-CM | POA: Diagnosis not present

## 2020-04-20 DIAGNOSIS — M170 Bilateral primary osteoarthritis of knee: Secondary | ICD-10-CM | POA: Diagnosis not present

## 2020-04-20 DIAGNOSIS — G479 Sleep disorder, unspecified: Secondary | ICD-10-CM | POA: Diagnosis not present

## 2020-04-24 ENCOUNTER — Telehealth: Payer: Self-pay | Admitting: Internal Medicine

## 2020-04-24 NOTE — Telephone Encounter (Signed)
Called to check on patient and symptoms, no answer LMTB

## 2020-04-24 NOTE — Telephone Encounter (Signed)
Keith Hall from [facility] are stated that Keith Hall missed his PT today due to the covid booster , Keith Hall stated that he was not sick before he received the booster shot.

## 2020-04-25 ENCOUNTER — Telehealth: Payer: Self-pay

## 2020-04-25 MED ORDER — ONDANSETRON 4 MG PO TBDP: 4.0000 mg | ORAL_TABLET | Freq: Three times a day (TID) | ORAL | 0 refills | Status: DC | PRN

## 2020-04-25 NOTE — Telephone Encounter (Signed)
Message sent from wife's MyChart account:  From: June C Derksen Sent: 04/24/2020   6:02 PM EST To: Pilar Grammes, CMA Subject: Apt for flu shots                              Hello, I'm sorry but me again. Keith Hall got [place] booster about 3pm Friday afternoon. Felt ok till Sat. Felt badly all day then began to throw up during the night, mostly mucous or bile. Continued to feel bad all day Sunday and today. Eating very little and still an occasional nausea. Went to bed tonight before 5 after eating 1 piece of toast with jelly. I gave him 1 pantoprasol, and Gatorade earlier which he, for the most part, did not drink. Urine very dark and smelly so I'm pushing as much water as I can. Advice please.

## 2020-04-25 NOTE — Telephone Encounter (Signed)
Returning the phone call  

## 2020-04-25 NOTE — Telephone Encounter (Signed)
Please let him know that I sent in a prescription for the nausea---so hopefully he can at least drink some fluids

## 2020-04-25 NOTE — Telephone Encounter (Signed)
See other TE from wife today.

## 2020-04-26 NOTE — Telephone Encounter (Signed)
Spoke to pt's wife. She said he seems to be doing a little better today.

## 2020-05-17 ENCOUNTER — Other Ambulatory Visit: Payer: Self-pay | Admitting: Internal Medicine

## 2020-05-17 ENCOUNTER — Telehealth: Payer: Self-pay

## 2020-05-17 DIAGNOSIS — N400 Enlarged prostate without lower urinary tract symptoms: Secondary | ICD-10-CM | POA: Diagnosis not present

## 2020-05-17 DIAGNOSIS — Z9181 History of falling: Secondary | ICD-10-CM | POA: Diagnosis not present

## 2020-05-17 DIAGNOSIS — Z96653 Presence of artificial knee joint, bilateral: Secondary | ICD-10-CM | POA: Diagnosis not present

## 2020-05-17 DIAGNOSIS — M170 Bilateral primary osteoarthritis of knee: Secondary | ICD-10-CM | POA: Diagnosis not present

## 2020-05-17 DIAGNOSIS — Z7901 Long term (current) use of anticoagulants: Secondary | ICD-10-CM | POA: Diagnosis not present

## 2020-05-17 DIAGNOSIS — F331 Major depressive disorder, recurrent, moderate: Secondary | ICD-10-CM | POA: Diagnosis not present

## 2020-05-17 DIAGNOSIS — R32 Unspecified urinary incontinence: Secondary | ICD-10-CM | POA: Diagnosis not present

## 2020-05-17 DIAGNOSIS — I69341 Monoplegia of lower limb following cerebral infarction affecting right dominant side: Secondary | ICD-10-CM | POA: Diagnosis not present

## 2020-05-17 DIAGNOSIS — G479 Sleep disorder, unspecified: Secondary | ICD-10-CM | POA: Diagnosis not present

## 2020-05-17 DIAGNOSIS — I69359 Hemiplegia and hemiparesis following cerebral infarction affecting unspecified side: Secondary | ICD-10-CM

## 2020-05-17 NOTE — Telephone Encounter (Signed)
Hello, Bobs in home therapy ends today. The therapist and I agree outpatient now would be good for him rather than allowing his body to go into rest mode. Would you please speak with Dr Silvio Pate about calling in another prescription to [facility] therapy. I realize they may still have a wait list but Id like to get him on that list. Thank you and have a good Holiday season. June Ps. Also on my need to know list is: will Ins continue to pay?

## 2020-05-18 NOTE — Telephone Encounter (Signed)
Sent her the message back on MyChart

## 2020-05-18 NOTE — Telephone Encounter (Signed)
Please let them know that I put in the referral and hopefully they should hear in the next few days. I would think the insurance will continue to cover this--as outpatient

## 2020-05-25 ENCOUNTER — Encounter: Payer: Self-pay | Admitting: Urology

## 2020-05-25 ENCOUNTER — Ambulatory Visit: Payer: PPO | Admitting: Urology

## 2020-05-25 ENCOUNTER — Other Ambulatory Visit: Payer: Self-pay

## 2020-05-25 VITALS — BP 143/86 | HR 80 | Ht 70.0 in | Wt 198.0 lb

## 2020-05-25 DIAGNOSIS — N401 Enlarged prostate with lower urinary tract symptoms: Secondary | ICD-10-CM

## 2020-05-25 DIAGNOSIS — N3281 Overactive bladder: Secondary | ICD-10-CM

## 2020-05-25 LAB — BLADDER SCAN AMB NON-IMAGING

## 2020-05-25 NOTE — Patient Instructions (Signed)

## 2020-05-25 NOTE — Progress Notes (Signed)
05/25/20 1:34 PM   Keith Hall 31-Dec-1945 [id number]  CC: Urgency/frequency, incontinence  HPI: I saw Keith Hall in urology clinic for overactive bladder symptoms.  He is a comorbid 74 year old male with a history of stroke, most recently with an episode in October 2021 and he has been in rehab.  He reports about 1 month of new urinary symptoms of urinary urgency and urge incontinence, primarily during the day.  He denies any dysuria or gross hematuria.  He drinks diet green tea and Diet Coke during the day.  He denies any weak stream or straining with urination.  He remains quite frail and is in a wheelchair.  IPSS score today is 23, with quality of life mostly dissatisfied.  PVR is normal at 2 mL.  Urinalysis is completely benign with 0-5 WBCs, 0 RBCs, no bacteria, nitrite negative, no leukocytes.  PSA was normal at 0.69 in January 2018.   PMH: Past Medical History:  Diagnosis Date  . BPH (benign prostatic hypertrophy)   . Depression 1980'sApr 08, 2004   MDD---even needed hospitalization  . 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 Kidney stone 1980 only  . Osteoarthritis of both knees   . Stroke ([place])   . Sudden cardiac death [facility]) September 23, 1999   no clear etiology of this    Surgical History: Past Surgical History:  Procedure Laterality Date  . CARDIAC CATHETERIZATION  last in 09/23/07   insignificant coronary disease  . CARDIOVASCULAR STRESS TEST  07/09/11   Negative--Dr Paraschos  . HERNIA REPAIR  09-23-10   left inguinal--Dr Byrnett  . ROTATOR CUFF REPAIR  2007/09/23   both done by Dr Margaretmary Eddy  . TOTAL KNEE ARTHROPLASTY Right 2/14   Dr Marry Guan  . TOTAL KNEE ARTHROPLASTY Left 1/15   Dr [facility]     Family History: Family History  Problem Relation Age of Onset  . Stroke Mother   . COPD Sister   . Diabetes Son        Type 1  . Heart disease Neg Hx   . Hypertension Neg Hx     Social History:  reports that he has never  smoked. He has never used smokeless tobacco. He reports that he does not drink alcohol and does not use drugs.  Physical Exam: BP (!) 143/86   Pulse 80   Ht 5\' 10"  (1.778 m)   Wt 198 lb (89.8 kg)   BMI 28.41 kg/m    Constitutional:  Alert and oriented, No acute distress.  Frail-appearing, in wheelchair Cardiovascular: No clubbing, cyanosis, or edema. Respiratory: Normal respiratory effort, no increased work of breathing. GI: Abdomen is soft, nontender, nondistended, no abdominal masses GU: Phallus with patent meatus, no lesions, testicles 20 cc and descended bilaterally without tenderness  Laboratory Data: Reviewed, see HPI  Pertinent Imaging: None to review  Assessment & Plan:   74 year old comorbid male with history of stroke, most recently in October 2021 who presents with worsening overactive bladder symptoms since that time.  PVR is normal and urinalysis is completely benign today.  We discussed the relationship of stroke with overactive bladder symptoms.  We also reviewed behavioral strategies at length, and I encouraged him to cut back on his diet soda and tea.  We discussed that overactive bladder (OAB) is not a disease, but is a symptom complex that is generally not life-threatening.  Symptoms typically include urinary urgency, frequency, and urge incontinence.  There are numerous treatment  options, however there are risks and benefits with both medical and surgical management.  First-line treatment is behavioral therapies including bladder training, pelvic floor muscle training, and fluid management.  Second line treatments include oral antimuscarinics(Ditropan er, Trospium) and beta-3 agonist (Mybetriq). There is typically a period of medication trial (4-8 weeks) to find the optimal therapy and dosing. If symptoms are bothersome despite the above management, third line options include intra-detrusor botox, peripheral tibial nerve stimulation (PTNS), and interstim (SNS). These are  more invasive treatments with higher side effect profile, but may improve quality of life for patients with severe OAB symptoms.   Trial of Myrbetriq 50 mg daily for OAB Behavioral strategies discussed at length, minimize sodas and diet drinks RTC 1 month with IPSS and PVR  Nickolas Madrid, MD 05/25/2020  [facility] [address] [place], [place] [address] [phone]

## 2020-05-26 LAB — MICROSCOPIC EXAMINATION
Bacteria, UA: NONE SEEN
Epithelial Cells (non renal): NONE SEEN /hpf (ref 0–10)
RBC, Urine: NONE SEEN /hpf (ref 0–2)

## 2020-05-26 LAB — URINALYSIS, COMPLETE
Bilirubin, UA: NEGATIVE
Glucose, UA: NEGATIVE
Ketones, UA: NEGATIVE
Leukocytes,UA: NEGATIVE
Nitrite, UA: NEGATIVE
Protein,UA: NEGATIVE
RBC, UA: NEGATIVE
Specific Gravity, UA: 1.025 (ref 1.005–1.030)
Urobilinogen, Ur: 0.2 mg/dL (ref 0.2–1.0)
pH, UA: 5 (ref 5.0–7.5)

## 2020-06-07 DIAGNOSIS — R2681 Unsteadiness on feet: Secondary | ICD-10-CM | POA: Diagnosis not present

## 2020-06-07 DIAGNOSIS — R262 Difficulty in walking, not elsewhere classified: Secondary | ICD-10-CM | POA: Diagnosis not present

## 2020-06-19 ENCOUNTER — Telehealth: Payer: Self-pay | Admitting: Urology

## 2020-06-19 DIAGNOSIS — N3281 Overactive bladder: Secondary | ICD-10-CM

## 2020-06-19 MED ORDER — MIRABEGRON ER 50 MG PO TB24: 50.0000 mg | ORAL_TABLET | Freq: Every day | ORAL | 11 refills | Status: DC

## 2020-06-19 NOTE — Telephone Encounter (Signed)
OK to change to a virtual phone visit with me anytime in the next few weeks, and refill mybetriq 50mg  30tabs x 11 refills, thanks  , MD 06/19/2020

## 2020-06-19 NOTE — Telephone Encounter (Signed)
Patient's wife called the office today.  Patient has a 4 week follow up appointment on 1/6.  Patient's wife states that the samples of Myrbetriq are working very well.  She states that it is difficult to get the patient in and out of the house (hx of stroke).  She wants to know if it is absolutely necessary for them to come into the office.  Is it possible to reschudule as a virtual visit?  Or would you be willing to send in a Rx to [facility] on [address]?  Please advise

## 2020-06-19 NOTE — Telephone Encounter (Signed)
Called pt's wife informed her of appt change, new date and time, appt changed to virtual. She voiced understanding. RX sent in.

## 2020-06-19 NOTE — Telephone Encounter (Signed)
Ok to change to virtual? Clarifying because pt was to have a PVR at return appt. Please advise.

## 2020-06-20 DIAGNOSIS — R2681 Unsteadiness on feet: Secondary | ICD-10-CM | POA: Diagnosis not present

## 2020-06-20 DIAGNOSIS — R262 Difficulty in walking, not elsewhere classified: Secondary | ICD-10-CM | POA: Diagnosis not present

## 2020-06-22 ENCOUNTER — Ambulatory Visit: Payer: Self-pay | Admitting: Physician Assistant

## 2020-06-22 DIAGNOSIS — R2681 Unsteadiness on feet: Secondary | ICD-10-CM | POA: Diagnosis not present

## 2020-06-22 DIAGNOSIS — R262 Difficulty in walking, not elsewhere classified: Secondary | ICD-10-CM | POA: Diagnosis not present

## 2020-06-27 DIAGNOSIS — R2681 Unsteadiness on feet: Secondary | ICD-10-CM | POA: Diagnosis not present

## 2020-06-27 DIAGNOSIS — R262 Difficulty in walking, not elsewhere classified: Secondary | ICD-10-CM | POA: Diagnosis not present

## 2020-07-03 ENCOUNTER — Telehealth: Payer: Self-pay | Admitting: Urology

## 2020-07-11 DIAGNOSIS — R262 Difficulty in walking, not elsewhere classified: Secondary | ICD-10-CM | POA: Diagnosis not present

## 2020-07-11 DIAGNOSIS — R2681 Unsteadiness on feet: Secondary | ICD-10-CM | POA: Diagnosis not present

## 2020-07-12 ENCOUNTER — Telehealth: Payer: Self-pay

## 2020-07-12 ENCOUNTER — Telehealth (INDEPENDENT_AMBULATORY_CARE_PROVIDER_SITE_OTHER): Payer: PPO | Admitting: Urology

## 2020-07-12 ENCOUNTER — Other Ambulatory Visit: Payer: Self-pay

## 2020-07-12 DIAGNOSIS — N3281 Overactive bladder: Secondary | ICD-10-CM

## 2020-07-12 NOTE — Telephone Encounter (Signed)
yes

## 2020-07-12 NOTE — Telephone Encounter (Signed)
Spoke to pt's wife. Made appt appt 07-18-20.

## 2020-07-12 NOTE — Progress Notes (Signed)
Virtual Visit via Telephone Note  I connected with Keith Hall on 07/12/20 at  4:00 PM EST by telephone and verified that I am speaking with the correct person using two identifiers.   Patient location: Home Provider location: [facility] Office   I discussed the limitations, risks, security and privacy concerns of performing an evaluation and management service by telephone and the availability of in person appointments. We discussed the impact of the COVID-19 pandemic on the healthcare system, and the importance of social distancing and reducing patient and provider exposure. I also discussed with the patient that there may be a patient responsible charge related to this service. The patient expressed understanding and agreed to proceed.  Reason for visit: Overactive bladder  History of Present Illness: 75 year old male who I saw on 05/25/2020 for overactive bladder symptoms of urgency, frequency, and urge incontinence.  He has a history of stroke and is very comorbid has been in rehab.  Today's visit was originally scheduled as in person, but with his frailty, they requested a change to a virtual visit.  We started a trial of Myrbetriq 50 mg daily at his last visit, and he has noticed a significant improvement.  He denies any incontinence.  He still has some urinary frequency.  He is also cut back on his green tea and Diet Coke during the day.  Overall he is very satisfied with his urinary symptoms.  At our last visit, urinalysis was benign and PVR was normal at 2 mL.  PSA was also normal at 0.69 in January 2018.  Follow Up: Continue Myrbetriq 50 mg daily RTC 1 year PVR and symptoms   I discussed the assessment and treatment plan with the patient. The patient was provided an opportunity to ask questions and all were answered. The patient agreed with the plan and demonstrated an understanding of the instructions.   The patient was advised to call back or seek an in-person  evaluation if the symptoms worsen or if the condition fails to improve as anticipated.  I provided 7 minutes of non-face-to-face time during this encounter.   Billey Co, MD

## 2020-07-12 NOTE — Telephone Encounter (Signed)
I am not sure what to prescribe since I am not sure what is going on. Is he having fever, SOB or other respiratory symptoms other than the cough from mucus? If not, this may be a worsening of his reflux. Make sure he is taking the protonix daily I may need to see him to get a better idea of what is going on

## 2020-07-12 NOTE — Telephone Encounter (Signed)
Mikki Santee has started having some problems with choking. When this happens he begins coughing pretty vigorously which then leads to an excessive amount of mucous coming up from, I'm guessing, his chest. Could you please call in a prescription that will clear the mucous. Main problem is that the mucous leads to actual vomiting. Thank you June Stringfellow

## 2020-07-12 NOTE — Telephone Encounter (Addendum)
Spoke to pt's wife. She said he does not have a fever, SOB, or respiratory symptoms. He does take his Protonix daily. His brother comes on Tuesday mornings to help her with him. Could we add him on at 12pm next Tuesday (07-18-20)?

## 2020-07-18 ENCOUNTER — Other Ambulatory Visit: Payer: Self-pay

## 2020-07-18 ENCOUNTER — Ambulatory Visit (INDEPENDENT_AMBULATORY_CARE_PROVIDER_SITE_OTHER): Payer: PPO | Admitting: Internal Medicine

## 2020-07-18 ENCOUNTER — Encounter: Payer: Self-pay | Admitting: Internal Medicine

## 2020-07-18 VITALS — BP 118/62 | HR 94 | Temp 98.4°F | Ht 69.0 in | Wt 189.0 lb

## 2020-07-18 DIAGNOSIS — F331 Major depressive disorder, recurrent, moderate: Secondary | ICD-10-CM | POA: Diagnosis not present

## 2020-07-18 DIAGNOSIS — R1313 Dysphagia, pharyngeal phase: Secondary | ICD-10-CM

## 2020-07-18 DIAGNOSIS — I69359 Hemiplegia and hemiparesis following cerebral infarction affecting unspecified side: Secondary | ICD-10-CM

## 2020-07-18 LAB — COMPREHENSIVE METABOLIC PANEL
ALT: 148 U/L — ABNORMAL HIGH (ref 0–53)
AST: 180 U/L — ABNORMAL HIGH (ref 0–37)
Albumin: 3.9 g/dL (ref 3.5–5.2)
Alkaline Phosphatase: 81 U/L (ref 39–117)
BUN: 16 mg/dL (ref 6–23)
CO2: 31 mEq/L (ref 19–32)
Calcium: 9.4 mg/dL (ref 8.4–10.5)
Chloride: 103 mEq/L (ref 96–112)
Creatinine, Ser: 0.82 mg/dL (ref 0.40–1.50)
GFR: 85.19 mL/min (ref >60.00)
Glucose, Bld: 97 mg/dL (ref 70–99)
Potassium: 4.3 mEq/L (ref 3.5–5.1)
Sodium: 139 mEq/L (ref 135–145)
Total Bilirubin: 0.7 mg/dL (ref 0.2–1.2)
Total Protein: 7 g/dL (ref 6.0–8.3)

## 2020-07-18 LAB — CBC
HCT: 40.5 % (ref 39.0–52.0)
Hemoglobin: 13.7 g/dL (ref 13.0–17.0)
MCHC: 33.9 g/dL (ref 30.0–36.0)
MCV: 88.4 fl (ref 78.0–100.0)
Platelets: 179 10*3/uL (ref 150.0–400.0)
RBC: 4.58 Mil/uL (ref 4.22–5.81)
RDW: 13.2 % (ref 11.5–15.5)
WBC: 6.9 10*3/uL (ref 4.0–10.5)

## 2020-07-18 LAB — SEDIMENTATION RATE: Sed Rate: 11 mm/hr (ref 0–20)

## 2020-07-18 NOTE — Assessment & Plan Note (Signed)
Depression is exacerbated with not feeling well or eating good Has lost weight Need to get the answer about his swallowing

## 2020-07-18 NOTE — Patient Instructions (Signed)
Please stop the myrbetriq for now.

## 2020-07-18 NOTE — Assessment & Plan Note (Signed)
No new neurologic symptoms  May still need another MRI if no other answer for the dysphagia

## 2020-07-18 NOTE — Assessment & Plan Note (Signed)
Goes back 2 months Wide differential  I am concerned about the myrbetriq--though no angioedema (but ?dry mouth). Will try off this just in case Will set up with ENT stat--make sure there is no laryngeal obstruction or lesion This could be a new stroke--but unusual without any other new symptoms Might need GI evaluation but no clear reflux

## 2020-07-18 NOTE — Progress Notes (Signed)
Subjective:    Patient ID: Keith Hall, male    DOB: [birth year], 75 y.o.   MRN: [id number]  HPI Here with brother Jenny Reichmann for swallowing problems This visit occurred during the SARS-CoV-2 public health emergency.  Safety protocols were in place, including screening questions prior to the visit, additional usage of staff PPE, and extensive cleaning of exam room while observing appropriate contact time as indicated for disinfecting solutions.   Having a terrible time trying to swallow Winds up throwing up a lot of mucus--clear Started a couple of months ago Doesn't feel swollen in his throat Especially bad with cracker or corn bread Does fine with liquids  No illness or fever No cough---other than when he gets choked  No heartburn No dysphagia sensation below larynx Bothers him at larynx when supine  Mood is not good Ongoing depression  Still with same left sided weakness  Current Outpatient Medications on File Prior to Visit  Medication Sig Dispense Refill  . aspirin 81 MG chewable tablet Chew 81 mg by mouth daily.    Marland Kitchen buPROPion (WELLBUTRIN SR) 150 MG 12 hr tablet TAKE 1 TABLET (150 MG TOTAL) BY MOUTH 2 (TWO) TIMES DAILY EVERY MORNING/AFTER LUNCH 180 tablet 3  . citalopram (CELEXA) 20 MG tablet TAKE 1 TABLET (20 MG TOTAL) BY MOUTH DAILY. 90 tablet 3  . Glucosamine-Chondroitin (MOVE FREE PO) Take 2 tablets by mouth daily.    . Melatonin 3-10 MG TABS Take by mouth at bedtime.    . mirabegron ER (MYRBETRIQ) 50 MG TB24 tablet Take 1 tablet (50 mg total) by mouth daily. 30 tablet 11  . Multiple Vitamin (MULTIVITAMIN) tablet Take 2 tablets by mouth daily.     . ondansetron (ZOFRAN-ODT) 4 MG disintegrating tablet Take 1 tablet (4 mg total) by mouth every 8 (eight) hours as needed for nausea or vomiting. 20 tablet 0  . pantoprazole (PROTONIX) 40 MG tablet TAKE 1 TABLET BY MOUTH EVERY DAY 90 tablet 3  . rosuvastatin (CRESTOR) 5 MG tablet TAKE 1 TABLET BY MOUTH EVERY DAY 90 tablet 3   . acetaminophen (TYLENOL) 500 MG tablet Take 500 mg by mouth 3 (three) times daily as needed.  (Patient not taking: Reported on 07/18/2020)    . diazepam (VALIUM) 2 MG tablet Take 1-2 tablets (2-4 mg total) by mouth 3 (three) times daily as needed (vertigo). (Patient not taking: Reported on 07/18/2020) 30 tablet 0  . meclizine (ANTIVERT) 25 MG tablet Take 1 tablet (25 mg total) by mouth 3 (three) times daily as needed for dizziness or nausea. (Patient not taking: Reported on 07/18/2020) 90 tablet 3   No current facility-administered medications on file prior to visit.    Allergies  Allergen Reactions  . Atorvastatin Other (See Comments)    Elevated LFTs  . Penicillins Hives, Swelling and Rash    Did it involve swelling of the face/tongue/throat, SOB, or low BP? Yes Did it involve sudden or severe rash/hives, skin peeling, or any reaction on the inside of your mouth or nose? Yes Did you need to seek medical attention at a hospital or doctor's office? Yes When did it last happen?15 years If all above answers are "NO", may proceed with cephalosporin use.    Past Medical History:  Diagnosis Date  . BPH (benign prostatic hypertrophy)   . Depression 1980's- 2004   MDD---even needed hospitalization  . 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 Kidney stone 1980  only  . Osteoarthritis of both knees   . Stroke ([place])   . Sudden cardiac death [facility]) 09/25/1999   no clear etiology of this    Past Surgical History:  Procedure Laterality Date  . CARDIAC CATHETERIZATION  last in Sep 25, 2007   insignificant coronary disease  . CARDIOVASCULAR STRESS TEST  07/09/11   Negative--Dr Paraschos  . HERNIA REPAIR  09/25/2010   left inguinal--Dr Byrnett  . ROTATOR CUFF REPAIR  2007-09-25   both done by Dr Margaretmary Eddy  . TOTAL KNEE ARTHROPLASTY Right 2/14   Dr Marry Guan  . TOTAL KNEE ARTHROPLASTY Left 1/15   Dr [facility]    Family History  Problem Relation Age of  Onset  . Stroke Mother   . COPD Sister   . Diabetes Son        Type 1  . Heart disease Neg Hx   . Hypertension Neg Hx     Social History   Socioeconomic History  . Marital status: Married    Spouse name: Not on file  . Number of children: 1  . Years of education: Not on file  . Highest education level: Not on file  Occupational History  . Occupation: Outside Oncologist    Comment: Retired at 73  [employer]  . Smoking status: Never Smoker  . Smokeless tobacco: Never Used  Substance and Sexual Activity  . Alcohol use: No  . Drug use: No  . Sexual activity: Not on file  Other Topics Concern  . Not on file  Social History Narrative   1 son   Has living will   Wife, then son, have health care POA   Would accept resuscitation attempts but no prolonged artificial ventilation   Would not want tube feeds if cognitively unaware   Social Determinants of Health   Financial Resource Strain: Not on file  Food Insecurity: Not on file  Transportation Needs: Not on file  Physical Activity: Not on file  Stress: Not on file  Social Connections: Not on file  Intimate Partner Violence: Not on file   Review of Systems Has lost weight  No new neurologic symptoms otherwise--no new findings on brain MRI 04/20/20    Objective:   Physical Exam Constitutional:      Appearance: Normal appearance.  HENT:     Mouth/Throat:     Comments: No edema or swelling Palate moves but decreased gag Neck:     Comments: Thyroid not palpable Pulmonary:     Effort: Pulmonary effort is normal.     Breath sounds: Normal breath sounds. No wheezing or rales.  Musculoskeletal:     Cervical back: Neck supple.  Lymphadenopathy:     Cervical: No cervical adenopathy.  Neurological:     Mental Status: He is alert.     Comments: Same left hand weakness---no changes            Assessment & Plan:

## 2020-07-19 ENCOUNTER — Ambulatory Visit: Payer: PPO | Admitting: Internal Medicine

## 2020-07-25 ENCOUNTER — Other Ambulatory Visit: Payer: Self-pay | Admitting: Otolaryngology

## 2020-07-25 ENCOUNTER — Telehealth: Payer: Self-pay

## 2020-07-25 DIAGNOSIS — R1314 Dysphagia, pharyngoesophageal phase: Secondary | ICD-10-CM | POA: Diagnosis not present

## 2020-07-25 DIAGNOSIS — R490 Dysphonia: Secondary | ICD-10-CM | POA: Diagnosis not present

## 2020-07-25 DIAGNOSIS — R1310 Dysphagia, unspecified: Secondary | ICD-10-CM

## 2020-07-25 NOTE — Chronic Care Management (AMB) (Addendum)
Chronic Care Management Pharmacy Assistant   Name: Keith Hall  MRN: [id number] DOB: Feb 01, 1946  Reason for Encounter: CCM follow up appointment reminder   Patient Questions:  1.  Have you seen any other providers since your last visit? Yes 07/18/20- Dr. Silvio Pate- PCP -  07/12/20- Dr. Nickolas Madrid- Urology televisit 05/25/20- Dr. Nickolas Madrid- Urology- Started trial of Myrbetriq 50 mg daily 10/18/2- Dr. Silvio Pate- PCP  PCP : Venia Carbon, MD  Allergies:   Allergies  Allergen Reactions   Atorvastatin Other (See Comments)    Elevated LFTs   Penicillins Hives, Swelling and Rash    Did it involve swelling of the face/tongue/throat, SOB, or low BP? Yes Did it involve sudden or severe rash/hives, skin peeling, or any reaction on the inside of your mouth or nose? Yes Did you need to seek medical attention at a hospital or doctor's office? Yes When did it last happen?    15 years  If all above answers are "NO", may proceed with cephalosporin use.    Medications: Outpatient Encounter Medications as of 07/25/2020  Medication Sig   acetaminophen (TYLENOL) 500 MG tablet Take 500 mg by mouth 3 (three) times daily as needed.  (Patient not taking: Reported on 07/18/2020)   aspirin 81 MG chewable tablet Chew 81 mg by mouth daily.   buPROPion (WELLBUTRIN SR) 150 MG 12 hr tablet TAKE 1 TABLET (150 MG TOTAL) BY MOUTH 2 (TWO) TIMES DAILY EVERY MORNING/AFTER LUNCH   citalopram (CELEXA) 20 MG tablet TAKE 1 TABLET (20 MG TOTAL) BY MOUTH DAILY.   diazepam (VALIUM) 2 MG tablet Take 1-2 tablets (2-4 mg total) by mouth 3 (three) times daily as needed (vertigo). (Patient not taking: Reported on 07/18/2020)   Glucosamine-Chondroitin (MOVE FREE PO) Take 2 tablets by mouth daily.   meclizine (ANTIVERT) 25 MG tablet Take 1 tablet (25 mg total) by mouth 3 (three) times daily as needed for dizziness or nausea. (Patient not taking: Reported on 07/18/2020)   Melatonin 3-10 MG TABS Take by mouth at bedtime.    mirabegron ER (MYRBETRIQ) 50 MG TB24 tablet Take 1 tablet (50 mg total) by mouth daily.   Multiple Vitamin (MULTIVITAMIN) tablet Take 2 tablets by mouth daily.    ondansetron (ZOFRAN-ODT) 4 MG disintegrating tablet Take 1 tablet (4 mg total) by mouth every 8 (eight) hours as needed for nausea or vomiting.   pantoprazole (PROTONIX) 40 MG tablet TAKE 1 TABLET BY MOUTH EVERY DAY   rosuvastatin (CRESTOR) 5 MG tablet TAKE 1 TABLET BY MOUTH EVERY DAY   No facility-administered encounter medications on file as of 07/25/2020.    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 D to review any health or medication changes. Patient denied changes to health.  States started [employer] since last visit with Sharyn Lull.   Are you having any problems with your medications? Not at this time   Any concerns he would like to discuss with the pharmacist? No  Patient reminded to have all medications, supplements and any blood sugar and blood pressure readings available for review with [name], Pharm. D, at their telephone visit on 07/27/20 at 1:00.     Follow-Up:  Pharmacist Review  [name], CPP notified  Margaretmary Dys,  [place] Assistant [phone]   I have reviewed the care management and care coordination activities outlined in this encounter and I am certifying that I agree with the content of this note. Will discuss with patient during follow up appointment.  [name], PharmD Clinical Pharmacist [facility] [phone]

## 2020-07-26 ENCOUNTER — Telehealth: Payer: Self-pay

## 2020-07-26 NOTE — Telephone Encounter (Signed)
Spoke to pt's wife. She will try to get him here for that appt on the 16th. They tried melatonin in the past, but it may have been a lower dose. They will try that.

## 2020-07-26 NOTE — Telephone Encounter (Signed)
I really will need to repeat some lab work (hopefully the liver will be back towards normal off the rosuvastatin--make sure he has stopped it). So, in person is better----just change the appt to try to adjust to her availability.  If the swallowing is no better off the myrbetriq--okay to restart that.  I don't want to start sedating meds----okay to try melatonin 3mg  (or even 6mg ) to help with sleep

## 2020-07-26 NOTE — Telephone Encounter (Signed)
    Bobs follow up visit to be virtual. Now I am asking for a Rx for a sleep aid for Keith Hall. He's getting up after I go to sleep, coming up the stairs, saying he can't sleep down here in the hospital bed. I'm worried he's going to break his neck. If he can please call in the Rx today. Keith     You  Keith Hall, Keith Hall 3 hours ago (7:28 AM)      So you are asking to make his follow-up visit a virtual or are you asking for a virtual visit for your back?     Keith Hall, Keith Hall  You 16 hours ago (6:42 PM)      Keith Hall saw Dr Marella Bile today. No answer but test at [facility] Monday am. Getting Keith Hall to the Dr today was very difficult for me (back). Therefore, I ask is it possible to have a virtual apt on the 16th? So far we've seen no improvement on choking but the in continence is totally back. Dr L wanted me to come at this apt so I can't get someone else to do it, plus my only help is on Tuesday and Friday. Thnx Keith Hall.  Ps. I have not mentioned the liver issue to [facility]. If necessary, think I'll let Dr L do it.

## 2020-07-27 ENCOUNTER — Ambulatory Visit (INDEPENDENT_AMBULATORY_CARE_PROVIDER_SITE_OTHER): Payer: PPO

## 2020-07-27 ENCOUNTER — Other Ambulatory Visit: Payer: Self-pay

## 2020-07-27 DIAGNOSIS — F331 Major depressive disorder, recurrent, moderate: Secondary | ICD-10-CM

## 2020-07-27 DIAGNOSIS — I779 Disorder of arteries and arterioles, unspecified: Secondary | ICD-10-CM

## 2020-07-27 DIAGNOSIS — I6523 Occlusion and stenosis of bilateral carotid arteries: Secondary | ICD-10-CM

## 2020-07-27 DIAGNOSIS — I69359 Hemiplegia and hemiparesis following cerebral infarction affecting unspecified side: Secondary | ICD-10-CM

## 2020-07-27 NOTE — Progress Notes (Addendum)
Chronic Care Management Pharmacy Note  08/03/2020 Name:  Keith Hall MRN:  [id number] DOB:  1946/04/25  Subjective: Keith Hall is an 75 y.o. year old male who is a primary patient of Letvak, Theophilus Kinds, MD.  The CCM team was consulted for assistance with disease management and care coordination needs.    Engaged with patient by telephone for follow up visit in response to provider referral for pharmacy case management and/or care coordination services.  Keith Hall (patient's wife/caregiver) provided visit information today.  Consent to Services:  The patient was given information about Chronic Care Management services, agreed to services, and gave verbal consent prior to initiation of services.  Please see initial visit note for detailed documentation.   Patient Care Team: Keith Carbon, MD as PCP - General (Pediatrics) Keith Hall, [facility] as Pharmacist (Pharmacist)  Recent office visits: 07/18/20 - Stop Myrbetriq to see if dry mouth and difficulty swallowing improves. Stop rosuvastatin due to elevated LFTs. Follow up in 2 weeks. 07/12/20 - Urology - [facility] visits: None in previous 6 months  Objective:  Lab Results  Component Value Date   CREATININE 0.82 07/18/2020   BUN 16 07/18/2020   GFR 85.19 07/18/2020   GFRNONAA >60 03/02/2019   GFRAA >60 03/02/2019   NA 139 07/18/2020   K 4.3 07/18/2020   CALCIUM 9.4 07/18/2020   CO2 31 07/18/2020    Lab Results  Component Value Date/Time   HGBA1C 5.1 02/23/2013 07:56 AM   GFR 85.19 07/18/2020 12:49 PM   GFR 82.79 04/03/2020 02:14 PM    Lab Results  Component Value Date   CHOL 149 04/03/2020   HDL 50.60 04/03/2020   LDLCALC 73 04/03/2020   LDLDIRECT 142.7 02/07/2014   TRIG 130.0 04/03/2020   CHOLHDL 3 04/03/2020    Hepatic Function Latest Ref Rng & Units 07/18/2020 04/03/2020 09/13/2019  Total Protein 6.0 - 8.3 g/dL 7.0 7.3 7.5  Albumin 3.5 - 5.2 g/dL 3.9 4.8 4.7  AST 0 - 37 U/L 180(H) 75(H) 39(H)  ALT 0 -  53 U/L 148(H) 53 31  Alk Phosphatase 39 - 117 U/L 81 69 68  Total Bilirubin 0.2 - 1.2 mg/dL 0.7 0.9 0.9  Bilirubin, Direct 0.0 - 0.3 mg/dL - - -    Lab Results  Component Value Date/Time   TSH 1.56 08/23/2014 12:51 PM   TSH 1.48 02/13/2013 03:06 PM   FREET4 0.80 04/03/2020 02:14 PM   FREET4 0.95 09/13/2019 03:18 PM    CBC Latest Ref Rng & Units 07/18/2020 04/03/2020 09/13/2019  WBC 4.0 - 10.5 K/uL 6.9 6.1 5.8  Hemoglobin 13.0 - 17.0 g/dL 13.7 14.6 14.6  Hematocrit 39.0 - 52.0 % 40.5 42.1 41.9  Platelets 150.0 - 400.0 K/uL 179.0 152.0 152.0    No results found for: VD25OH  Clinical ASCVD: Yes  The ASCVD Risk score Keith Hall DC Jr., et al., 2013) failed to calculate for the following reasons:   The patient has a prior MI or stroke diagnosis    Depression screen [facility] 2/9 09/22/2015  Decreased Interest 0  Down, Depressed, Hopeless 0  PHQ - 2 Score 0     Social History   Tobacco Use  Smoking Status Never Smoker  Smokeless Tobacco Never Used   BP Readings from Last 3 Encounters:  07/18/20 118/62  05/25/20 (!) 143/86  04/03/20 116/78   Pulse Readings from Last 3 Encounters:  07/18/20 94  05/25/20 80  04/03/20 83   Wt Readings from Last  3 Encounters:  07/18/20 189 lb (85.7 kg)  05/25/20 198 lb (89.8 kg)  04/03/20 196 lb 12.8 oz (89.3 kg)    Assessment/Interventions: Review of patient past medical history, allergies, medications, health status, including review of consultants reports, laboratory and other test data, was performed as part of comprehensive evaluation and provision of chronic care management services.   SDOH:  (Social Determinants of Health) assessments and interventions performed: Yes SDOH Interventions   Flowsheet Row Most Recent Value  SDOH Interventions   Financial Strain Interventions Intervention Not Indicated  [medications affordable]      CCM Care Plan  Allergies  Allergen Reactions  . Atorvastatin Other (See Comments)    Elevated LFTs  .  Penicillins Hives, Swelling and Rash    Did it involve swelling of the face/tongue/throat, SOB, or low BP? Yes Did it involve sudden or severe rash/hives, skin peeling, or any reaction on the inside of your mouth or nose? Yes Did you need to seek medical attention at a hospital or doctor's office? Yes When did it last happen?15 years If all above answers are "NO", may proceed with cephalosporin use.    Medications Reviewed Today    Reviewed by Keith Hall, [facility] (Pharmacist) on 08/03/20 at [id number]  Med List Status: <None>  Medication Order Taking? Sig Documenting Provider Last Dose Status Informant  acetaminophen (TYLENOL) 500 MG tablet [id number] Yes Take 500 mg by mouth 3 (three) times daily as needed. [provider] Taking Active            Med Note Keith Hall, Keith Hall   Wed Jul 22, 2018 11:50 AM)    aspirin 81 MG chewable tablet [id number] Yes Chew 81 mg by mouth daily. [provider] Taking Active Spouse/Significant Other  buPROPion [facility] SR) 150 MG 12 hr tablet [id number] Yes TAKE 1 TABLET (150 MG TOTAL) BY MOUTH 2 (TWO) TIMES DAILY EVERY MORNING/AFTER LUNCH Keith Carbon, MD Taking Active   citalopram (CELEXA) 20 MG tablet [id number] Yes TAKE 1 TABLET (20 MG TOTAL) BY MOUTH DAILY. Keith Carbon, MD Taking Active   diazepam (VALIUM) 2 MG tablet [id number] No Take 1-2 tablets (2-4 mg total) by mouth 3 (three) times daily as needed (vertigo).  Patient not taking: No sig reported   Keith Carbon, MD Not Taking Active   Glucosamine-Chondroitin (MOVE FREE PO) [id number] Yes Take 2 tablets by mouth daily. [provider] Taking Active Spouse/Significant Other  meclizine (ANTIVERT) 25 MG tablet [id number] No Take 1 tablet (25 mg total) by mouth 3 (three) times daily as needed for dizziness or nausea.  Patient not taking: No sig reported   Keith Carbon, MD Not Taking Active   Melatonin 3-10 MG TABS [id number] Yes Take by mouth at bedtime. Takes 20  mg at bedtime [provider] Taking Active Spouse/Significant Other  mirabegron ER (MYRBETRIQ) 50 MG TB24 tablet [id number] No Take 1 tablet (50 mg total) by mouth daily.  Patient not taking: Reported on 08/03/2020   Billey Co, MD Not Taking Active   Multiple Vitamin (MULTIVITAMIN) tablet [id number] Yes Take 2 tablets by mouth daily.  [provider] Taking Active Spouse/Significant Other  ondansetron (ZOFRAN-ODT) 4 MG disintegrating tablet [id number] Yes Take 1 tablet (4 mg total) by mouth every 8 (eight) hours as needed for nausea or vomiting. Keith Carbon, MD Taking Active   pantoprazole (PROTONIX) 40 MG tablet [id number] Yes TAKE 1 TABLET BY MOUTH EVERY DAY Keith Carbon, MD Taking Active   rosuvastatin (  CRESTOR) 5 MG tablet [id number] No TAKE 1 TABLET BY MOUTH EVERY DAY  Patient not taking: Reported on 08/03/2020   Keith Carbon, MD Not Taking Active           Patient Active Problem List   Diagnosis Date Noted  . Pharyngeal dysphagia 07/18/2020  . 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 Sleep disturbance 03/12/2019  . Vertigo 03/02/2019  . Low back pain 03/13/2016  . Hemiparesis affecting nondominant side as late effect of cerebrovascular accident ([place]) 09/28/2014  . Fatigue 08/23/2014  . 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 Advanced directives, counseling/discussion 02/07/2014  . Routine general medical examination at a health care facility 02/13/2013  . Osteoarthritis of both knees   . MDD (major depressive disorder), recurrent episode, moderate ([place])   . BPH (benign prostatic hyperplasia)     Immunization History  Administered Date(s) Administered  . Fluad Quad(high Dose 65+) 03/03/2019, 03/15/2020  . Influenza Split 03/17/2013  . Influenza, High Dose Seasonal PF 03/07/2015, 03/04/2016  . Influenza,inj,Quad PF,6+ Mos 02/07/2014, 03/06/2017,  03/19/2018  . Moderna Sars-Covid-2 Vaccination 04/21/2020  . Pneumococcal Conjugate-13 02/07/2014  . Pneumococcal Polysaccharide-23 01/23/2013  . Td 03/12/2019  . Tdap 06/18/2007  . Zoster 06/17/2010    Conditions to be addressed/monitored:  Hyperlipidemia and GERD  Care Plan : Anon Raices  Updates made by Keith Hall, [facility] since 08/03/2020 12:00 AM    Problem: Hyperlipidemia/CAD and GERD   Note:    Current Barriers:  Marland Kitchen Medications not synchronized  . Uncontrolled GERD symptoms  Pharmacist Clinical Goal(s):  Marland Kitchen Over the next 30 days, patient will achieve control of GERD as evidenced by reduced acid reflux symptoms through collaboration with PharmD and provider.   Interventions: . 1:1 collaboration with Keith Carbon, MD regarding development and update of comprehensive plan of care as evidenced by provider attestation and co-signature . Inter-disciplinary care team collaboration (see longitudinal plan of care) . Comprehensive medication review performed; medication list updated in electronic medical record  Hyperlipidemia/ASCVD: (LDL goal < 70) -uncontrolled -Current treatment: . Aspirin 81 mg - 1 tablet daily -Medications previously tried: rosuvastatin - stopped last week due to elevated LFTs, atorvastatin elevated LFTs -History of stoke 2016 with hemipareies -Recommended continue off statin.   GERD (Goal: Control symptoms) -uncontrolled -Current treatment  . Pantoprazole 40 mg - 1 capsule in the morning -Medications previously tried: none reported  -Reports symptoms at night - would like to try BID dosing (20 mg BID) -Collaborated with Dr. Silvio Pate - discussed pantoprazole dose change. Awaiting response.  Patient Goals/Self-Care Activities . Over the next 30 days, patient will:  - take medications as prescribed  Follow Up Plan: The care management team will reach out to the patient again over the next 30 days.        Medication Review: Taking  daily: Citalopram, Bupropion, Multivitamin, Move Free, Melatonin, Aspirin PRNs: Diazepam, Tylenol, Meclizine, Zofran (would like new prescription delivered) Currently on hold:Rosuvastatin, Mirabegron   - He is off Myrbetriq due to dry mouth/swallowing. Reports so far he has been off both for 1 week and no change in swallowing. Incontinence has worsened since off Myrbetriq.  - Not taking meclizine or diazepam. Has not had any vertigo. Keeps as PRN - Reports last night increased his melatonin to 20 mg per PCP  - She would like a refill on Zofran PRN - Discussed Junes caregiver burden. Contacted [facility] to  find out more information about in home care, but they are not accepted new clients at this time. Also not covered by insurance. Patient made aware. Coordinate with Dr. Silvio Pate regarding his recommendations for assistance with care.  Medication Assistance: None required.  Patient affirms current coverage meets needs.  Patient's preferred pharmacy is:  [facility] - [place], [place] - [address] [place] [address] Phone: [phone] Fax: [phone]  Keith Hall organizes his medications and coordinates pick up from [facility]. She is interested in [facility] for medication sync and delivery. Long lines at [facility] lately. We discussed: Benefits of medication synchronization, packaging and delivery as well as enhanced pharmacist oversight with [facility]. Patient decided to: Utilize [facility] for medication synchronization, packaging and delivery  Care Plan and Follow Up Patient Decision:  Patient agrees to Care Plan and Follow-up.  Keith Hall, PharmD Clinical Pharmacist [facility] at [facility] [phone]  Encounter details: CCM Time Spent      Value Time User   Time spent with patient (minutes)  120 08/03/2020  9:33 AM Keith Hall, [facility]   Time spent performing Chart review  30 08/03/2020  9:33 AM Keith Hall, [facility]    Total time (minutes)  150 08/03/2020  9:33 AM Keith Hall, RPH     Moderate to High Complex Decision Making      Value Time User   Moderate to High complex decision making  Yes 08/03/2020  9:33 AM Keith Hall, [facility]     CCM Services: Routine  I have personally reviewed this encounter including the documentation in this note and have collaborated with the care management provider regarding care management and care coordination activities to include development and update of the comprehensive care plan. I am certifying that I agree with the content of this note and encounter as supervising physician.

## 2020-07-31 ENCOUNTER — Other Ambulatory Visit: Payer: Self-pay

## 2020-07-31 ENCOUNTER — Ambulatory Visit
Admission: RE | Admit: 2020-07-31 | Discharge: 2020-07-31 | Disposition: A | Discharge status: Home / Self Care | Payer: PPO | Source: Ambulatory Visit | Attending: Otolaryngology | Admitting: Otolaryngology

## 2020-07-31 DIAGNOSIS — R1310 Dysphagia, unspecified: Secondary | ICD-10-CM | POA: Insufficient documentation

## 2020-08-01 ENCOUNTER — Telehealth: Payer: Self-pay | Admitting: Internal Medicine

## 2020-08-01 ENCOUNTER — Other Ambulatory Visit: Payer: Self-pay | Admitting: Otolaryngology

## 2020-08-01 ENCOUNTER — Telehealth: Payer: Self-pay | Admitting: Speech Pathology

## 2020-08-01 DIAGNOSIS — Y844 Aspiration of fluid as the cause of abnormal reaction of the patient, or of later complication, without mention of misadventure at the time of the procedure: Secondary | ICD-10-CM

## 2020-08-01 NOTE — Telephone Encounter (Signed)
Okay to push back the appointment. It sound like he may need more care than she is able to give---we should discuss getting more help. He may need to just try eating even with some risk---unless he is willing to have a feeding tube. I could put him on my home visit list--but it may be a month or more till I can get there

## 2020-08-01 NOTE — Telephone Encounter (Signed)
Patient's wife, June,called.  She's requesting Larene Beach return her call.  She cancelled patient's appointment for tomorrow because he has testing at the hospital tomorrow at 1:00.

## 2020-08-01 NOTE — Telephone Encounter (Signed)
We spoke in a different note.

## 2020-08-01 NOTE — Telephone Encounter (Signed)
Spoke to pt's wife.  1. Results were not very positive. Shows he is aspirating. He is choking on everything. Losing weight. Will not eat or drink in fear of choking. 2. She will restart the Myrbetriq. 3. Asking if he really needs to come in tomorrow. Really hard for the pt to come in. But thinks that the issues with Dr Pryor Ochoa are a little more important. Dr Vaught's office is work with wife to get the new test done ASAP.  Also, her brother passed away and she is trying to get with her family to make arrangements but needs to make sure pt is taken care of 1st.  Pt's wife said Dr Silvio Pate said she needed to be at the visit with him tomorrow. Asked if this was something that could be discussed over the phone.  Cannot do anything for himself anymore.

## 2020-08-01 NOTE — Telephone Encounter (Signed)
Hello, Keith Hall, Bobs condition is not improving. If anything it's worse. The choking is scary. I've got a call in to Dr Pryor Ochoa office , waiting to hear back. I'm going to try to put a rush on the next test. Guessing you got results from yesterday's.

## 2020-08-01 NOTE — Telephone Encounter (Signed)
Appt for tomorrow has been canceled. She will get back with [place] about doing home visits. She and her son are getting together and possibly discussing placing him in assisted living. It is becoming more than she can take care of even though she has tried her hardest.

## 2020-08-01 NOTE — Telephone Encounter (Signed)
I spoke with pt's wife regarding recent results of barium swallow (aspiration of liquids leading to termination of study). She voiced an urgency about scheduling an Outpatient MBSS as she reports that pt is "chokig a lot."   I was able to coordinate scheduling him on Wednesday 08/02/2020 at 2pm with me. She confirmed that she can bring him at this time.   Alys Dulak B. Rutherford Nail M.S., [place], [place] Office [phone]

## 2020-08-02 ENCOUNTER — Ambulatory Visit: Payer: PPO | Admitting: Internal Medicine

## 2020-08-02 ENCOUNTER — Telehealth: Payer: Self-pay

## 2020-08-02 ENCOUNTER — Other Ambulatory Visit: Payer: Self-pay

## 2020-08-02 ENCOUNTER — Ambulatory Visit
Admission: RE | Admit: 2020-08-02 | Discharge: 2020-08-02 | Disposition: A | Discharge status: Home / Self Care | Payer: PPO | Source: Ambulatory Visit | Attending: Otolaryngology | Admitting: Otolaryngology

## 2020-08-02 DIAGNOSIS — R1312 Dysphagia, oropharyngeal phase: Secondary | ICD-10-CM | POA: Diagnosis not present

## 2020-08-02 DIAGNOSIS — T17208A Unspecified foreign body in pharynx causing other injury, initial encounter: Secondary | ICD-10-CM | POA: Diagnosis not present

## 2020-08-02 DIAGNOSIS — Y844 Aspiration of fluid as the cause of abnormal reaction of the patient, or of later complication, without mention of misadventure at the time of the procedure: Secondary | ICD-10-CM

## 2020-08-02 NOTE — Telephone Encounter (Signed)
Patient's on pantoprazole 40 mg once daily. Wife reports his acid reflux symptoms are worse at night and she thinks it wearing off. She is interested in trying the pantoprazole at lower dose twice daily - 20 mg BID, in place of the 40 mg once daily. She also is requesting a refill on Zofran to have for PRN use. Using [facility].  Thanks,  [name], PharmD Clinical Pharmacist [facility] at [facility] [phone]

## 2020-08-02 NOTE — Telephone Encounter (Signed)
Yes---it sounds like that is the best decision.

## 2020-08-02 NOTE — Progress Notes (Signed)
Modified Barium Swallow Progress Note  Patient Details  Name: Keith Hall MRN: [id number] Date of Birth: Jan 04, 1946  Today's Date: 08/02/2020  Modified Barium Swallow completed.  Full report located under Chart Review in the Imaging Section.  Brief recommendations include the following:  Clinical Impression  Pt presents mild to moderate oral phase and moderate to severe pharyngeal phase sensori-motor dysphagia. Pt's oral phase is c/b decreased lingual manipulation that results in decrease bolus cohesion, premature spillage and piecemeal swallow of nectar thick liquids via spoon and cup, thin liquids via spoon and cup as well as puree. Pt also demonstrated reduced base of tongue strength, reduced pharyngeal peristalsis with resultant mild to moderate widespread residue throughout the vallecula, pyriform sinuses, and lateral channels. Pt also demonstrate reduced lateral and anterior movement of the hyoid which results in decrease epiglottic deflection and reduced duration and amplitude of cricopharyngeus opening across all consistencies trialed. Pt only has aspiration of nectar thick liquids via cup but aspirate was ejected out during the swallow. Aspiration was not observed when thin liquids or puree. Head turn to left didn't improve ability. Pt is at a high risk of aspiration of pharyngeal residue. At this time, pt should continue home diet with thin liquids and recommend course of Outpatient ST to target pharyngeal strengthening exercises to reduce risk of aspiration.   Swallow Evaluation Recommendations       SLP Diet Recommendations:  (resume home diet with thin liquids via cup)   Liquid Administration via: Cup   Medication Administration: Whole meds with puree   Supervision: Patient able to self feed;Full supervision/cueing for compensatory strategies   Compensations: Minimize environmental distractions;Slow rate;Small sips/bites   Postural Changes: Seated upright at 90 degrees    Oral Care Recommendations: Oral care BID      [place] Rico B. Rutherford Nail M.S., CCC-SLP, [place] Office [phone]   Emmakate Hypes Rutherford Nail 08/02/2020,4:39 PM

## 2020-08-03 MED ORDER — PANTOPRAZOLE SODIUM 40 MG PO TBEC: 40.0000 mg | DELAYED_RELEASE_TABLET | Freq: Two times a day (BID) | ORAL | 3 refills | Status: DC

## 2020-08-03 MED ORDER — ONDANSETRON 4 MG PO TBDP: 4.0000 mg | ORAL_TABLET | Freq: Three times a day (TID) | ORAL | 3 refills | Status: AC | PRN

## 2020-08-03 NOTE — Telephone Encounter (Signed)
Sounds good. Please send Zofran refill as well if approved.  Sharyn Lull

## 2020-08-03 NOTE — Telephone Encounter (Signed)
Please let her know that with the persistent symptoms, I would try 40mg  twice a day (empty stomach). We could consider decreasing if his symptoms go away. I did send the Rx

## 2020-08-03 NOTE — Patient Instructions (Addendum)
Dear Keith Hall,  Below is a summary of the goals we discussed during our follow up appointment on August 03, 2020. Please contact me anytime with questions or concerns.   Visit Information Patient Care Plan: CCM Pharmacy Care Plan    Problem Identified: Hyperlipidemia/CAD and GERD   Note:    Current Barriers:  Marland Kitchen Medications not synchronized  . Uncontrolled GERD symptoms  Pharmacist Clinical Goal(s):  Marland Kitchen Over the next 30 days, patient will achieve control of GERD as evidenced by reduced acid reflux symptoms through collaboration with PharmD and provider.   Interventions: . 1:1 collaboration with Venia Carbon, MD regarding development and update of comprehensive plan of care as evidenced by provider attestation and co-signature . Inter-disciplinary care team collaboration (see longitudinal plan of care) . Comprehensive medication review performed; medication list updated in electronic medical record  Hyperlipidemia/ASCVD: (LDL goal < 70) -uncontrolled -Current treatment: . Aspirin 81 mg - 1 tablet daily -Medications previously tried: rosuvastatin - stopped last week due to elevated LFTs, atorvastatin elevated LFTs -History of stoke 2016 with hemipareies -Recommended continue off statin.   GERD (Goal: Control symptoms) -uncontrolled -Current treatment  . Pantoprazole 40 mg - 1 capsule in the morning -Medications previously tried: none reported  -Reports symptoms at night - would like to try BID dosing (20 mg BID) -Collaborated with Dr. Silvio Pate - discussed pantoprazole dose change. Awaiting response.  Patient Goals/Self-Care Activities . Over the next 30 days, patient will:  - take medications as prescribed  Follow Up Plan: The care management team will reach out to the patient again over the next 30 days.      Verbal consent obtained for [facility] enhanced pharmacy services (medication synchronization, adherence packaging, delivery coordination). A  medication sync plan was created to allow patient to get all medications delivered once every 30 to 90 days per patient preference. Patient understands they have freedom to choose pharmacy and clinical pharmacist will coordinate care between all prescribers and [facility].  Patient verbalizes understanding of instructions provided today and agrees to view in [place].    [name], PharmD Clinical Pharmacist [facility] [facility] at [facility] [phone]

## 2020-08-04 ENCOUNTER — Telehealth: Payer: Self-pay | Admitting: Speech Pathology

## 2020-08-04 NOTE — Telephone Encounter (Signed)
I reached out to Remo Lipps (pt's wife) about scheduling follow up dysphagia therapy. She states that she is exhausted and will have family in town for the next week or so. She is unable to make any appts at this time. She will call back when pt has more availability.  Jerah Esty B. Rutherford Nail M.S., [place], [place] Office [phone]

## 2020-08-13 ENCOUNTER — Emergency Department
Admission: EM | Admit: 2020-08-13 | Discharge: 2020-08-13 | Disposition: A | Discharge status: Home / Self Care | Payer: PPO | Attending: Emergency Medicine | Admitting: Emergency Medicine

## 2020-08-13 ENCOUNTER — Other Ambulatory Visit: Payer: Self-pay

## 2020-08-13 ENCOUNTER — Emergency Department: Payer: PPO

## 2020-08-13 DIAGNOSIS — R5381 Other malaise: Secondary | ICD-10-CM | POA: Diagnosis not present

## 2020-08-13 DIAGNOSIS — K5641 Fecal impaction: Secondary | ICD-10-CM

## 2020-08-13 DIAGNOSIS — Z7982 Long term (current) use of aspirin: Secondary | ICD-10-CM | POA: Insufficient documentation

## 2020-08-13 DIAGNOSIS — K5900 Constipation, unspecified: Secondary | ICD-10-CM | POA: Insufficient documentation

## 2020-08-13 DIAGNOSIS — R52 Pain, unspecified: Secondary | ICD-10-CM | POA: Diagnosis not present

## 2020-08-13 DIAGNOSIS — Z96653 Presence of artificial knee joint, bilateral: Secondary | ICD-10-CM | POA: Diagnosis not present

## 2020-08-13 NOTE — ED Notes (Signed)
Soap suds enema performed. Pt tolerated procedure well. Pt able to have small BM.

## 2020-08-13 NOTE — ED Notes (Signed)
Pt assisted to the bathroom to have a BM at this time. Pt currently on toilet to have a BM.

## 2020-08-13 NOTE — ED Provider Notes (Signed)
[facility] Emergency Department Provider Note  Time seen: 12:26 PM  I have reviewed the triage vital signs and the nursing notes.   HISTORY  Chief Complaint Constipation   HPI Keith Hall is a 75 y.o. male with a past medical history of kidney stones, CVA, presents to the emergency department for constipation.  According to the patient for the past 8 to 10 days he has been constipated with no bowel movement.  States he is now experiencing rectal pain.  Denies any abdominal pain nausea or vomiting.  No fever.  States he feels like there is stool in his rectum but he cannot get it out.   Past Medical History:  Diagnosis Date  . BPH (benign prostatic hypertrophy)   . Depression 1980's2004/05/01   MDD---even needed hospitalization  . 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 Kidney stone 1980 only  . Osteoarthritis of both knees   . Stroke ([place])   . Sudden cardiac death [facility]) 10/16/1999   no clear etiology of this    Patient Active Problem List   Diagnosis Date Noted  . Pharyngeal dysphagia 07/18/2020  . 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 Sleep disturbance 03/12/2019  . Vertigo 03/02/2019  . Low back pain 03/13/2016  . 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 Fatigue 08/23/2014  . 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 Advanced directives, counseling/discussion 02/07/2014  . Routine general medical examination at a health care facility 2013-03-05  . Osteoarthritis of both knees   . MDD (major depressive disorder), recurrent episode, moderate ([place])   . BPH (benign prostatic hyperplasia)     Past Surgical History:  Procedure Laterality Date  . CARDIAC CATHETERIZATION  last in 10-16-2007   insignificant coronary disease  . CARDIOVASCULAR STRESS TEST   07/09/11   Negative--Dr Paraschos  . HERNIA REPAIR  Oct 16, 2010   left inguinal--Dr Byrnett  . ROTATOR CUFF REPAIR  10-16-07   both done by Dr Margaretmary Eddy  . TOTAL KNEE ARTHROPLASTY Right 2/14   Dr Marry Guan  . TOTAL KNEE ARTHROPLASTY Left 1/15   Dr [facility]    Prior to Admission medications   Medication Sig Start Date End Date Taking? Authorizing Provider  acetaminophen (TYLENOL) 500 MG tablet Take 500 mg by mouth 3 (three) times daily as needed. 06/21/14   [provider]  aspirin 81 MG chewable tablet Chew 81 mg by mouth daily.    [provider]  buPROPion (WELLBUTRIN SR) 150 MG 12 hr tablet TAKE 1 TABLET (150 MG TOTAL) BY MOUTH 2 (TWO) TIMES DAILY EVERY MORNING/AFTER LUNCH 04/03/20   Viviana Simpler I, MD  citalopram (CELEXA) 20 MG tablet TAKE 1 TABLET (20 MG TOTAL) BY MOUTH DAILY. 11/22/19   Venia Carbon, MD  diazepam (VALIUM) 2 MG tablet Take 1-2 tablets (2-4 mg total) by mouth 3 (three) times daily as needed (vertigo). Patient not taking: No sig reported 03/14/19   Venia Carbon, MD  Glucosamine-Chondroitin (MOVE FREE PO) Take 2 tablets by mouth daily.    [provider]  meclizine (ANTIVERT) 25 MG tablet Take 1 tablet (25 mg total) by mouth 3 (three) times daily as needed for dizziness or nausea. Patient not taking: No sig reported 03/12/19   Venia Carbon, MD  Melatonin 3-10 MG TABS Take by mouth  at bedtime. Takes 20 mg at bedtime    [provider]  mirabegron ER (MYRBETRIQ) 50 MG TB24 tablet Take 1 tablet (50 mg total) by mouth daily. Patient not taking: Reported on 08/03/2020 06/19/20   Billey Co, MD  Multiple Vitamin (MULTIVITAMIN) tablet Take 2 tablets by mouth daily.     [provider]  ondansetron (ZOFRAN-ODT) 4 MG disintegrating tablet Take 1 tablet (4 mg total) by mouth every 8 (eight) hours as needed for nausea or vomiting. 08/03/20   Venia Carbon, MD  pantoprazole (PROTONIX) 40 MG tablet Take 1 tablet (40 mg total) by  mouth 2 (two) times daily. 08/03/20   Venia Carbon, MD  rosuvastatin (CRESTOR) 5 MG tablet TAKE 1 TABLET BY MOUTH EVERY DAY Patient not taking: Reported on 08/03/2020 05/17/20   Venia Carbon, MD    Allergies  Allergen Reactions  . Atorvastatin Other (See Comments)    Elevated LFTs  . Penicillins Hives, Swelling and Rash    Did it involve swelling of the face/tongue/throat, SOB, or low BP? Yes Did it involve sudden or severe rash/hives, skin peeling, or any reaction on the inside of your mouth or nose? Yes Did you need to seek medical attention at a hospital or doctor's office? Yes When did it last happen?15 years If all above answers are "NO", may proceed with cephalosporin use.    Family History  Problem Relation Age of Onset  . Stroke Mother   . COPD Sister   . Diabetes Son        Type 1  . Heart disease Neg Hx   . Hypertension Neg Hx     Social History Social History   Tobacco Use  . Smoking status: Never Smoker  . Smokeless tobacco: Never Used  Substance Use Topics  . Alcohol use: No  . Drug use: No    Review of Systems Constitutional: Negative for fever. Cardiovascular: Negative for chest pain. Respiratory: Negative for shortness of breath. Gastrointestinal: Negative for abdominal pain.  Positive for rectal pain.  Negative for vomiting or diarrhea.  Positive for constipation x8 to 10 days. Genitourinary: Negative for urinary compaints Musculoskeletal: Negative for musculoskeletal complaints Neurological: Negative for headache All other ROS negative  ____________________________________________   PHYSICAL EXAM:  VITAL SIGNS: ED Triage Vitals  Enc Vitals Group     BP 08/13/20 1105 114/62     Pulse Rate 08/13/20 1105 75     Resp 08/13/20 1105 18     Temp 08/13/20 1105 97.9 F (36.6 C)     Temp Source 08/13/20 1105 Oral     SpO2 08/13/20 1105 96 %     Weight 08/13/20 1107 188 lb (85.3 kg)     Height 08/13/20 1107 5\' 9"  (1.753 m)     Head  Circumference --      Peak Flow --      Pain Score 08/13/20 1106 8     Pain Loc --      Pain Edu? --      Excl. in [place]? --    Constitutional: Alert and oriented. Well appearing and in no distress. Eyes: Normal exam ENT      Head: Normocephalic and atraumatic.      Mouth/Throat: Mucous membranes are moist. Cardiovascular: Normal rate, regular rhythm Respiratory: Normal respiratory effort without tachypnea nor retractions. Breath sounds are clear Gastrointestinal: Soft, nontender to palpation.  No rebound guarding or distention. Musculoskeletal: Nontender with normal range of motion in all extremities.  Neurologic:  Normal speech and language.  Skin:  Skin is warm, dry and intact.  Psychiatric: Mood and affect are normal  ____________________________________________     RADIOLOGY  X-rays unremarkable.  ____________________________________________   INITIAL IMPRESSION / ASSESSMENT AND PLAN / ED COURSE  Pertinent labs & imaging results that were available during my care of the patient were reviewed by me and considered in my medical decision making (see chart for details).   Patient presents emergency department for constipation x8 to 10 days with rectal pain.  Nontender abdomen.  Highly suggestive of possible fecal impaction leading to constipation.  We will obtain a two-view abdominal x-ray.  We will likely attempt digital disimpaction shortly.  Patient may require enema after disimpaction.  X-ray unremarkable.  I performed a digital disimpaction with a significant amount of hard stool removed from the rectum.  We will perform a soapsuds enema to see if the patient can have a bowel movement.  Patient has now had 2 good sized bowel movements per patient.  We will discharge the patient home.  Recommended MiraLAX twice daily which the patient already has at home.  Patient agreeable.  Provided return  precautions.    ------------------------------------------------------------------------------------------------------------------- Fecal Disimpaction Procedure Note:  Performed by me:  Patient placed in the lateral recumbent position with knees drawn towards chest. Nurse present for patient support. Large amount of hard brown stool removed. No complications during procedure.   ------------------------------------------------------------------------------------------------------------------    [name] was evaluated in Emergency Department on 08/13/2020 for the symptoms described in the history of present illness. He was evaluated in the context of the global COVID-19 pandemic, which necessitated consideration that the patient might be at risk for infection with the SARS-CoV-2 virus that causes COVID-19. Institutional protocols and algorithms that pertain to the evaluation of patients at risk for COVID-19 are in a state of rapid change based on information released by regulatory bodies including the CDC and federal and state organizations. These policies and algorithms were followed during the patient's care in the ED.  ____________________________________________   FINAL CLINICAL IMPRESSION(S) / ED DIAGNOSES  Constipation Fecal impaction   Harvest Dark, MD 08/13/20 1434

## 2020-08-13 NOTE — ED Triage Notes (Signed)
Pt from home, alert and oriented x4, pt with constipation x 8 days, has used miralax since this past Wednesday with no relief. Pt denies N/V. Pt states he is eating normally. Pt had endoscopy about 3 months ago for swallowing issues. Pt denies pain.

## 2020-08-13 NOTE — Discharge Instructions (Addendum)
As we discussed please continue to use MiraLAX (full capful) twice daily with a large glass of water until he had multiple bowel movements.  Please follow-up with your doctor regarding today's ER visit.  Return to the emergency department for development of any abdominal pain, fever, or any other symptom personally concerning to yourself.

## 2020-08-14 ENCOUNTER — Other Ambulatory Visit: Payer: Self-pay | Admitting: Internal Medicine

## 2020-08-14 ENCOUNTER — Other Ambulatory Visit: Payer: Self-pay | Admitting: Urology

## 2020-08-14 DIAGNOSIS — N3281 Overactive bladder: Secondary | ICD-10-CM

## 2020-08-15 ENCOUNTER — Telehealth: Payer: Self-pay

## 2020-08-15 NOTE — Telephone Encounter (Signed)
Spoke to pt's wife. She is looking at a few places to place him. She will let us know if we need to do FL2

## 2020-08-15 NOTE — Telephone Encounter (Signed)
Spoke to pt's wife, Keith Hall, to see how he was doing after ER visit yesterday for fecal impaction. She said he has not had a BM since the ER. I asked if she is doing the Miralax twice a day as suggested. She said she had been giving it to him once a day prior to this and it was not working. She gave him Metamucil. I advised her that it was suggested to do Miralax twice daily and that Metamucil is fiber and can make it harder for him to go because it can bulk up the stool.  She said his choking is getting worse. She has called a few places to place him but she said he needs 24/7 care because of the choking and other issues. She is thinking about having someone come in the house. Asked if Dr Silvio Pate had any suggestions for placement.

## 2020-08-15 NOTE — Telephone Encounter (Signed)
I generally would recommend assisted living---like [facility], the [place] or the [facility]. They will probably be about $5K a month

## 2020-08-17 ENCOUNTER — Telehealth: Payer: Self-pay | Admitting: Speech Pathology

## 2020-08-17 NOTE — Telephone Encounter (Signed)
June Londo called to cancel her husband's upcoming ST appt on 08/23/2020.   She reports that pt is falling and choking more frequently. She is pursuing possible SNF placement. She will keep rehab informed as decisions are made.   [place] office informed of request to cancel the appt.   Giovanne Nickolson B. Rutherford Nail M.S., [place], [place] Office [phone]

## 2020-08-18 ENCOUNTER — Telehealth: Payer: Self-pay

## 2020-08-18 NOTE — Telephone Encounter (Signed)
Spoke to pt's wife. She asked if Dr Silvio Pate would write a rx for a medication with the fewest side effects to try. Send to [facility]. She is willing to try a medication with possible side effects if it will help him not choke. Asking what the side effects might be. Hoping it is something Dr Silvio Pate is familiar with.

## 2020-08-18 NOTE — Telephone Encounter (Signed)
Hello, Keith Hall literally gurgles with this mucous in his throat. Is there any reason why I cannot give him mucinex to dry it up some? Thank you  June

## 2020-08-18 NOTE — Telephone Encounter (Signed)
Mucinex may actually increase the mucus. There are some medications that can dry up secretions---but are prone to serious side effects (and I haven't seen him through all this)

## 2020-08-22 MED ORDER — GLYCOPYRROLATE 1 MG PO TABS: 1.0000 mg | ORAL_TABLET | Freq: Three times a day (TID) | ORAL | 0 refills | Status: DC

## 2020-08-22 NOTE — Telephone Encounter (Signed)
Rx sent electronically. Sent wife a message and left VM.

## 2020-08-22 NOTE — Addendum Note (Signed)
Addended by: Pilar Grammes on: 08/22/2020 11:11 AM   Modules accepted: Orders

## 2020-08-22 NOTE — Telephone Encounter (Signed)
Okay to send robinul 1mg  tid prn to reduce secretions #60 x 0

## 2020-08-23 ENCOUNTER — Encounter: Payer: PPO | Admitting: Speech Pathology

## 2020-08-29 ENCOUNTER — Ambulatory Visit: Payer: PPO | Attending: Otolaryngology | Admitting: Speech Pathology

## 2020-08-29 ENCOUNTER — Encounter: Payer: Self-pay | Admitting: Emergency Medicine

## 2020-08-29 ENCOUNTER — Other Ambulatory Visit: Payer: Self-pay

## 2020-08-29 ENCOUNTER — Telehealth: Payer: Self-pay | Admitting: Speech Pathology

## 2020-08-29 ENCOUNTER — Inpatient Hospital Stay
Admission: EM | Admit: 2020-08-29 | Discharge: 2020-09-01 | DRG: 392 | Disposition: A | Discharge status: Hospice | Payer: PPO | Attending: Internal Medicine | Admitting: Internal Medicine

## 2020-08-29 DIAGNOSIS — K219 Gastro-esophageal reflux disease without esophagitis: Secondary | ICD-10-CM | POA: Diagnosis present

## 2020-08-29 DIAGNOSIS — E785 Hyperlipidemia, unspecified: Secondary | ICD-10-CM | POA: Diagnosis not present

## 2020-08-29 DIAGNOSIS — Z7982 Long term (current) use of aspirin: Secondary | ICD-10-CM

## 2020-08-29 DIAGNOSIS — Z7401 Bed confinement status: Secondary | ICD-10-CM | POA: Diagnosis not present

## 2020-08-29 DIAGNOSIS — F419 Anxiety disorder, unspecified: Secondary | ICD-10-CM | POA: Diagnosis present

## 2020-08-29 DIAGNOSIS — I639 Cerebral infarction, unspecified: Secondary | ICD-10-CM | POA: Diagnosis not present

## 2020-08-29 DIAGNOSIS — Z7189 Other specified counseling: Secondary | ICD-10-CM | POA: Diagnosis not present

## 2020-08-29 DIAGNOSIS — B370 Candidal stomatitis: Secondary | ICD-10-CM | POA: Diagnosis not present

## 2020-08-29 DIAGNOSIS — N401 Enlarged prostate with lower urinary tract symptoms: Secondary | ICD-10-CM | POA: Diagnosis not present

## 2020-08-29 DIAGNOSIS — Z833 Family history of diabetes mellitus: Secondary | ICD-10-CM | POA: Diagnosis not present

## 2020-08-29 DIAGNOSIS — Z20822 Contact with and (suspected) exposure to covid-19: Secondary | ICD-10-CM | POA: Diagnosis present

## 2020-08-29 DIAGNOSIS — Z823 Family history of stroke: Secondary | ICD-10-CM

## 2020-08-29 DIAGNOSIS — J3800 Paralysis of vocal cords and larynx, unspecified: Secondary | ICD-10-CM | POA: Diagnosis not present

## 2020-08-29 DIAGNOSIS — Z825 Family history of asthma and other chronic lower respiratory diseases: Secondary | ICD-10-CM

## 2020-08-29 DIAGNOSIS — I69351 Hemiplegia and hemiparesis following cerebral infarction affecting right dominant side: Secondary | ICD-10-CM | POA: Diagnosis not present

## 2020-08-29 DIAGNOSIS — Z96653 Presence of artificial knee joint, bilateral: Secondary | ICD-10-CM | POA: Diagnosis not present

## 2020-08-29 DIAGNOSIS — Z79899 Other long term (current) drug therapy: Secondary | ICD-10-CM | POA: Diagnosis not present

## 2020-08-29 DIAGNOSIS — M170 Bilateral primary osteoarthritis of knee: Secondary | ICD-10-CM | POA: Diagnosis not present

## 2020-08-29 DIAGNOSIS — R1310 Dysphagia, unspecified: Secondary | ICD-10-CM | POA: Diagnosis not present

## 2020-08-29 DIAGNOSIS — R945 Abnormal results of liver function studies: Secondary | ICD-10-CM | POA: Diagnosis not present

## 2020-08-29 DIAGNOSIS — M2550 Pain in unspecified joint: Secondary | ICD-10-CM | POA: Diagnosis not present

## 2020-08-29 DIAGNOSIS — R45851 Suicidal ideations: Secondary | ICD-10-CM

## 2020-08-29 DIAGNOSIS — R059 Cough, unspecified: Secondary | ICD-10-CM

## 2020-08-29 DIAGNOSIS — R29898 Other symptoms and signs involving the musculoskeletal system: Secondary | ICD-10-CM | POA: Diagnosis not present

## 2020-08-29 DIAGNOSIS — N400 Enlarged prostate without lower urinary tract symptoms: Secondary | ICD-10-CM | POA: Diagnosis not present

## 2020-08-29 DIAGNOSIS — B379 Candidiasis, unspecified: Secondary | ICD-10-CM | POA: Diagnosis present

## 2020-08-29 DIAGNOSIS — R748 Abnormal levels of other serum enzymes: Secondary | ICD-10-CM

## 2020-08-29 DIAGNOSIS — F332 Major depressive disorder, recurrent severe without psychotic features: Secondary | ICD-10-CM | POA: Diagnosis not present

## 2020-08-29 DIAGNOSIS — F418 Other specified anxiety disorders: Secondary | ICD-10-CM | POA: Diagnosis not present

## 2020-08-29 DIAGNOSIS — Z888 Allergy status to other drugs, medicaments and biological substances status: Secondary | ICD-10-CM | POA: Diagnosis not present

## 2020-08-29 DIAGNOSIS — Z66 Do not resuscitate: Secondary | ICD-10-CM | POA: Diagnosis not present

## 2020-08-29 DIAGNOSIS — R1313 Dysphagia, pharyngeal phase: Secondary | ICD-10-CM | POA: Insufficient documentation

## 2020-08-29 DIAGNOSIS — Z515 Encounter for palliative care: Secondary | ICD-10-CM | POA: Diagnosis not present

## 2020-08-29 DIAGNOSIS — Z880 Allergy status to penicillin: Secondary | ICD-10-CM | POA: Diagnosis not present

## 2020-08-29 DIAGNOSIS — R1312 Dysphagia, oropharyngeal phase: Secondary | ICD-10-CM | POA: Insufficient documentation

## 2020-08-29 DIAGNOSIS — I69391 Dysphagia following cerebral infarction: Secondary | ICD-10-CM | POA: Diagnosis not present

## 2020-08-29 DIAGNOSIS — Z136 Encounter for screening for cardiovascular disorders: Secondary | ICD-10-CM | POA: Diagnosis not present

## 2020-08-29 DIAGNOSIS — R7989 Other specified abnormal findings of blood chemistry: Secondary | ICD-10-CM | POA: Diagnosis present

## 2020-08-29 DIAGNOSIS — R531 Weakness: Secondary | ICD-10-CM | POA: Diagnosis not present

## 2020-08-29 DIAGNOSIS — I69398 Other sequelae of cerebral infarction: Secondary | ICD-10-CM

## 2020-08-29 LAB — HEPATITIS PANEL, ACUTE
HCV Ab: NONREACTIVE
Hep A IgM: NONREACTIVE
Hep B C IgM: NONREACTIVE
Hepatitis B Surface Ag: NONREACTIVE

## 2020-08-29 LAB — COMPREHENSIVE METABOLIC PANEL
ALT: 219 U/L — ABNORMAL HIGH (ref 0–44)
AST: 265 U/L — ABNORMAL HIGH (ref 15–41)
Albumin: 3.8 g/dL (ref 3.5–5.0)
Alkaline Phosphatase: 83 U/L (ref 38–126)
Anion gap: 8 (ref 5–15)
BUN: 18 mg/dL (ref 8–23)
CO2: 26 mmol/L (ref 22–32)
Calcium: 8.9 mg/dL (ref 8.9–10.3)
Chloride: 103 mmol/L (ref 98–111)
Creatinine, Ser: 0.6 mg/dL — ABNORMAL LOW (ref 0.61–1.24)
GFR, Estimated: >60 mL/min (ref >60)
Glucose, Bld: 102 mg/dL — ABNORMAL HIGH (ref 70–99)
Potassium: 4.6 mmol/L (ref 3.5–5.1)
Sodium: 137 mmol/L (ref 135–145)
Total Bilirubin: 0.7 mg/dL (ref 0.3–1.2)
Total Protein: 7 g/dL (ref 6.5–8.1)

## 2020-08-29 LAB — CBC
HCT: 43.1 % (ref 39.0–52.0)
Hemoglobin: 14.1 g/dL (ref 13.0–17.0)
MCH: 29.2 pg (ref 26.0–34.0)
MCHC: 32.7 g/dL (ref 30.0–36.0)
MCV: 89.2 fL (ref 80.0–100.0)
Platelets: 202 10*3/uL (ref 150–400)
RBC: 4.83 MIL/uL (ref 4.22–5.81)
RDW: 14.4 % (ref 11.5–15.5)
WBC: 7.3 10*3/uL (ref 4.0–10.5)
nRBC: 0 % (ref 0.0–0.2)

## 2020-08-29 LAB — RESP PANEL BY RT-PCR (FLU A&B, COVID) ARPGX2
Influenza A by PCR: NEGATIVE
Influenza B by PCR: NEGATIVE
SARS Coronavirus 2 by RT PCR: NEGATIVE

## 2020-08-29 LAB — GLUCOSE, CAPILLARY: Glucose-Capillary: 77 mg/dL (ref 70–99)

## 2020-08-29 LAB — SALICYLATE LEVEL: Salicylate Lvl: <7 mg/dL — ABNORMAL LOW (ref 7.0–30.0)

## 2020-08-29 LAB — ACETAMINOPHEN LEVEL: Acetaminophen (Tylenol), Serum: <10 ug/mL — ABNORMAL LOW (ref 10–30)

## 2020-08-29 LAB — ETHANOL: Alcohol, Ethyl (B): <10 mg/dL (ref <10)

## 2020-08-29 MED ORDER — OLANZAPINE 5 MG PO TABS
5.0000 mg | ORAL_TABLET | Freq: Every day | ORAL | Status: DC
  Administered 2020-08-29 – 2020-09-01 (×4): 5 mg via ORAL
  Filled 2020-08-29 (×4): qty 1

## 2020-08-29 MED ORDER — PANTOPRAZOLE SODIUM 40 MG IV SOLR
40.0000 mg | Freq: Two times a day (BID) | INTRAVENOUS | Status: DC
  Administered 2020-08-29 – 2020-08-31 (×5): 40 mg via INTRAVENOUS
  Filled 2020-08-29 (×5): qty 40

## 2020-08-29 MED ORDER — GLYCOPYRROLATE 0.2 MG/ML IJ SOLN
0.1000 mg | Freq: Three times a day (TID) | INTRAMUSCULAR | Status: DC
  Administered 2020-08-29 – 2020-09-01 (×11): 0.1 mg via INTRAVENOUS
  Filled 2020-08-29 (×3): qty 1
  Filled 2020-08-29: qty 0.5
  Filled 2020-08-29: qty 1
  Filled 2020-08-29: qty 0.5
  Filled 2020-08-29: qty 1
  Filled 2020-08-29 (×2): qty 0.5
  Filled 2020-08-29: qty 1
  Filled 2020-08-29: qty 0.5
  Filled 2020-08-29: qty 1
  Filled 2020-08-29 (×2): qty 0.5

## 2020-08-29 MED ORDER — DEXTROSE 50 % IV SOLN: 50.0000 mL | INTRAVENOUS | Status: DC | PRN

## 2020-08-29 MED ORDER — HEPARIN SODIUM (PORCINE) 5000 UNIT/ML IJ SOLN
5000.0000 [IU] | Freq: Three times a day (TID) | INTRAMUSCULAR | Status: DC
  Administered 2020-08-30 – 2020-08-31 (×4): 5000 [IU] via SUBCUTANEOUS
  Filled 2020-08-29 (×4): qty 1

## 2020-08-29 MED ORDER — LACTATED RINGERS IV SOLN: INTRAVENOUS | Status: AC

## 2020-08-29 MED ORDER — ONDANSETRON HCL 4 MG/2ML IJ SOLN: 4.0000 mg | Freq: Three times a day (TID) | INTRAMUSCULAR | Status: DC | PRN

## 2020-08-29 MED ORDER — BUPROPION HCL ER (XL) 150 MG PO TB24
300.0000 mg | ORAL_TABLET | Freq: Every day | ORAL | Status: DC
  Administered 2020-08-29 – 2020-09-01 (×2): 300 mg via ORAL
  Filled 2020-08-29 (×3): qty 2

## 2020-08-29 MED ORDER — CITALOPRAM HYDROBROMIDE 20 MG PO TABS
20.0000 mg | ORAL_TABLET | Freq: Every day | ORAL | Status: DC
  Administered 2020-08-29 – 2020-09-01 (×2): 20 mg via ORAL
  Filled 2020-08-29 (×4): qty 1

## 2020-08-29 NOTE — ED Notes (Signed)
Pt alert  Iv fluids infusing.

## 2020-08-29 NOTE — ED Provider Notes (Addendum)
[facility] Emergency Department Provider Note  ____________________________________________   Event Date/Time   First MD Initiated Contact with Patient 08/29/20 1139     (approximate)  I have reviewed the triage vital signs and the nursing notes.   HISTORY  Chief Complaint Suicidal   HPI Keith Hall is a 75 y.o. male with a past medical history of CVA with some residual left hemibody weakness, osteoarthritis, kidney stone, BPH and major depressive disorder who presents from outpatient speech therapy clinic where he was undergoing further evaluation of some dysphagia has been struggling with for some time after he made some comments that he was feeling suicidal.  Patient notes he is currently taking Celexa and has been on Wellbutrin in the past but does not think he is currently taking it.  He does not think it was helping.  He states he is under a lot of stress because does feel like his wife is helping him too much.  He does endorse that he had a handgun in his bedroom and was think about shooting himself with it.  He denies any HI or hallucinations.  Denies any attempt to harm himself prior to arrival to emergency room.  No other acute concerns at this time.          Past Medical History:  Diagnosis Date  . BPH (benign prostatic hypertrophy)   . Depression 1980's03-30-04   MDD---even needed hospitalization  . 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 Kidney stone 1980 only  . Osteoarthritis of both knees   . Stroke ([place])   . Sudden cardiac death [facility]) Sep 14, 1999   no clear etiology of this    Patient Active Problem List   Diagnosis Date Noted  . Pharyngeal dysphagia 07/18/2020  . 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 Sleep disturbance 03/12/2019  . Vertigo 03/02/2019  . Low back pain 03/13/2016  .  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 Fatigue 08/23/2014  . 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 Advanced directives, counseling/discussion 02/07/2014  . Routine general medical examination at a health care facility 02/10/2013  . Osteoarthritis of both knees   . MDD (major depressive disorder), recurrent episode, moderate ([place])   . BPH (benign prostatic hyperplasia)     Past Surgical History:  Procedure Laterality Date  . CARDIAC CATHETERIZATION  last in 09/14/2007   insignificant coronary disease  . CARDIOVASCULAR STRESS TEST  07/09/11   Negative--Dr Paraschos  . HERNIA REPAIR  09/14/10   left inguinal--Dr Byrnett  . ROTATOR CUFF REPAIR  2007-09-14   both done by Dr Margaretmary Eddy  . TOTAL KNEE ARTHROPLASTY Right 2/14   Dr Marry Guan  . TOTAL KNEE ARTHROPLASTY Left 1/15   Dr [facility]    Prior to Admission medications   Medication Sig Start Date End Date Taking? Authorizing Provider  acetaminophen (TYLENOL) 500 MG tablet Take 500 mg by mouth 3 (three) times daily as needed. 06/21/14   [provider]  aspirin 81 MG chewable tablet Chew 81 mg by mouth daily.    [provider]  buPROPion (WELLBUTRIN SR) 150 MG 12 hr tablet TAKE 1 TABLET (150 MG TOTAL) BY MOUTH 2 (TWO) TIMES DAILY EVERY MORNING/AFTER LUNCH 04/03/20   Viviana Simpler I, MD  citalopram (CELEXA) 20 MG tablet TAKE 1 TABLET (20  MG TOTAL) BY MOUTH DAILY. 11/22/19   Venia Carbon, MD  diazepam (VALIUM) 2 MG tablet Take 1-2 tablets (2-4 mg total) by mouth 3 (three) times daily as needed (vertigo). Patient not taking: No sig reported 03/14/19   Venia Carbon, MD  Glucosamine-Chondroitin (MOVE FREE PO) Take 2 tablets by mouth daily.    [provider]  glycopyrrolate (ROBINUL) 1 MG tablet Take 1 tablet (1 mg total) by mouth 3 (three) times daily. 08/22/20   Venia Carbon, MD  meclizine (ANTIVERT) 25 MG tablet Take 1 tablet (25 mg total) by mouth  3 (three) times daily as needed for dizziness or nausea. Patient not taking: No sig reported 03/12/19   Viviana Simpler I, MD  Melatonin 3-10 MG TABS Take by mouth at bedtime. Takes 20 mg at bedtime    [provider]  Multiple Vitamin (MULTIVITAMIN) tablet Take 2 tablets by mouth daily.     [provider]  MYRBETRIQ 50 MG TB24 tablet TAKE 1 TABLET BY MOUTH EVERY DAY 08/15/20   Billey Co, MD  ondansetron (ZOFRAN-ODT) 4 MG disintegrating tablet Take 1 tablet (4 mg total) by mouth every 8 (eight) hours as needed for nausea or vomiting. 08/03/20   Venia Carbon, MD  pantoprazole (PROTONIX) 40 MG tablet Take 1 tablet (40 mg total) by mouth 2 (two) times daily. 08/03/20   Venia Carbon, MD  rosuvastatin (CRESTOR) 5 MG tablet TAKE 1 TABLET BY MOUTH EVERY DAY Patient not taking: Reported on 08/03/2020 05/17/20   Venia Carbon, MD    Allergies Atorvastatin and Penicillins  Family History  Problem Relation Age of Onset  . Stroke Mother   . COPD Sister   . Diabetes Son        Type 1  . Heart disease Neg Hx   . Hypertension Neg Hx     Social History Social History   Tobacco Use  . Smoking status: Never Smoker  . Smokeless tobacco: Never Used  Substance Use Topics  . Alcohol use: No  . Drug use: No    Review of Systems  Review of Systems  Constitutional: Negative for chills and fever.  HENT: Negative for sore throat.   Eyes: Negative for pain.  Respiratory: Negative for cough and stridor.   Cardiovascular: Negative for chest pain.  Gastrointestinal: Negative for vomiting.  Genitourinary: Negative for dysuria.  Musculoskeletal: Negative for myalgias.  Skin: Negative for rash.  Neurological: Positive for speech change ( very horse) and focal weakness ( L side of body since stroke). Negative for seizures, loss of consciousness and headaches.  Psychiatric/Behavioral: Positive for depression and suicidal ideas.  All other systems reviewed and are  negative.     ____________________________________________   PHYSICAL EXAM:  VITAL SIGNS: ED Triage Vitals  Enc Vitals Group     BP 08/29/20 1111 (!) 116/94     Pulse Rate 08/29/20 1111 86     Resp 08/29/20 1111 16     Temp 08/29/20 1111 97.7 F (36.5 C)     Temp Source 08/29/20 1111 Oral     SpO2 08/29/20 1111 96 %     Weight 08/29/20 1117 180 lb (81.6 kg)     Height 08/29/20 1117 5\' 9"  (1.753 m)     Head Circumference --      Peak Flow --      Pain Score 08/29/20 1117 0     Pain Loc --      Pain Edu? --  Excl. in [place]? --    Vitals:   08/29/20 1111  BP: (!) 116/94  Pulse: 86  Resp: 16  Temp: 97.7 F (36.5 C)  SpO2: 96%   Physical Exam Vitals and nursing note reviewed.  Constitutional:      Appearance: He is well-developed.  HENT:     Head: Normocephalic and atraumatic.     Right Ear: External ear normal.     Left Ear: External ear normal.     Nose: Nose normal.     Mouth/Throat:     Pharynx: Oropharynx is clear.  Eyes:     Conjunctiva/sclera: Conjunctivae normal.  Cardiovascular:     Rate and Rhythm: Normal rate and regular rhythm.     Heart sounds: No murmur heard.   Pulmonary:     Effort: Pulmonary effort is normal. No respiratory distress.     Breath sounds: Normal breath sounds.  Abdominal:     Palpations: Abdomen is soft.     Tenderness: There is no abdominal tenderness.  Musculoskeletal:     Cervical back: Neck supple.     Right lower leg: No edema.     Left lower leg: No edema.  Skin:    General: Skin is warm and dry.     Capillary Refill: Capillary refill takes less than 2 seconds.  Neurological:     Mental Status: He is alert and oriented to person, place, and time.  Psychiatric:        Thought Content: Thought content includes suicidal ideation. Thought content does not include homicidal ideation. Thought content includes suicidal plan.      ____________________________________________   LABS (all labs ordered are listed, but  only abnormal results are displayed)  Labs Reviewed  RESP PANEL BY RT-PCR (FLU A&B, COVID) ARPGX2  CBC  COMPREHENSIVE METABOLIC PANEL  ETHANOL  SALICYLATE LEVEL  ACETAMINOPHEN LEVEL  URINE DRUG SCREEN, QUALITATIVE ([place])   ____________________________________________  EKG  ____________________________________________  RADIOLOGY  ED MD interpretation:   Official radiology report(s): No results found.  ____________________________________________   PROCEDURES  Procedure(s) performed (including Critical Care):  Procedures   ____________________________________________   INITIAL IMPRESSION / ASSESSMENT AND PLAN / ED COURSE      Patient presents with above-stated history exam for assessment of some worsening depression and a plan to possibly treat himself after he made some comments and outpatient speech therapy clinic he is being further evaluated for some dysphagia.  On arrival he is afebrile hemodynamically stable.  He denies any other acute complaints at this time.  He does endorse plan to possibly shoot himself.  Denies any HI does not appear psychotic on exam.  He is otherwise hemodynamically stable.  No recent traumatic injuries or falls no suspicion for acute infectious process or other organic etiology contributing to presentation at this time.  It does seem patient is under multiple stressors including difficulty swallowing voice changes and his weakness from a stroke and feeling like he is a burden to family involved and contributing is feeling.  Routine psych labs sent.  Psychiatry and he has consulted.  IVC paperwork filled out with concerns for patient safety  The patient has been placed in psychiatric observation due to the need to provide a safe environment for the patient while obtaining psychiatric consultation and evaluation, as well as ongoing medical and medication management to treat the patient's condition.  The patient has been placed under full IVC  at this time.   While awaiting psychiatric evaluation emergency room was  contacted over the phone by patient speech-language pathologist is on the outpatient clinic earlier today.  She states he has evidence of aspiration and apparently has been aspirating several times requiring Heimlich maneuver by his wife for last couple weeks.  She does not feel like he is safe to take anything by mouth at this time recommends medicine admission for further evaluation management.  Given this additional background history we will plan to admit to medicine service for further evaluation management and maintain patient n.p.o. at this time although we will start maintenance fluids for couple hours.      ____________________________________________   FINAL CLINICAL IMPRESSION(S) / ED DIAGNOSES  Final diagnoses:  Suicidal ideation  Dysphagia, unspecified type    Medications - No data to display   ED Discharge Orders    None       Note:  This document was prepared using Dragon voice recognition software and may include unintentional dictation errors.   Lucrezia Starch, MD 08/29/20 1202    Lucrezia Starch, MD 08/29/20 [phone]

## 2020-08-29 NOTE — Therapy (Signed)
[place] MAIN [facility] SERVICES [address] [place], [place], [address] Phone: [phone]   Fax:  [phone]  Speech Language Pathology Treatment  Patient Details  Name: Keith Hall MRN: [id number] Date of Birth: 24-Feb-1946 No data recorded  Encounter Date: 08/29/2020    Past Medical History:  Diagnosis Date  . BPH (benign prostatic hypertrophy)   . Depression 1980's04/12/2002   MDD---even needed hospitalization  . 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 Kidney stone 1980 only  . Osteoarthritis of both knees   . Stroke ([place])   . Sudden cardiac death [facility]) Sep 22, 1999   no clear etiology of this    Past Surgical History:  Procedure Laterality Date  . CARDIAC CATHETERIZATION  last in September 22, 2007   insignificant coronary disease  . CARDIOVASCULAR STRESS TEST  07/09/11   Negative--Dr Paraschos  . HERNIA REPAIR  09-22-10   left inguinal--Dr Byrnett  . ROTATOR CUFF REPAIR  2007/09/22   both done by Dr Margaretmary Eddy  . TOTAL KNEE ARTHROPLASTY Right 2/14   Dr Marry Guan  . TOTAL KNEE ARTHROPLASTY Left 1/15   Dr [facility]     Pt is known to this therapist. Pt arrives today for dysphagia management.   Pertinant History:  Esophagus - 07/31/2020  - Patient was given an initial swallow of barium demonstrating  fullness in the hypopharynx with piecemeal swallowing.  - 2nd swallow demonstrated substantial aspiration with vigorous cough  reflex.  IMPRESSION:  Study terminated due to relatively large volume aspiration on the  second swallow. Speech pathology consultation likely warranted.   MBSS - 08/02/2020 - peformed by this writer - Mild to moderate oral phase dysphagia - moderate to severe sensori-motor pharyngeal dysphagia - severe pharyngeal residue - HIGH ASPIRATION RISK of pharyngeal residue  Per chart, pt has increased difficulty managing his secretions (currently taking robinul 1mg  TID to reduce  secretions). Per chart, his wife reports "Mikki Santee literally gurles with his mucous in his throat." Pt arrived to session with his brother. Pt reports that he is now CHOKING TO THE POINT THAT HE CANNOT BREATHE. His wife is FREQUENTLY PERFORMING [place] on him. During this evaluation, pt does appear to have progressive worsened. He is currently aphonic with no voicing heard throughout hour long session; suspect d/t decreased respiratory support. As such, pt isn't able to produce a cough. In an effort to increase respiratory strength, EMST (Expiratory Muscle Strength Training) was attempted. Pt was not able to blow against 30cmH2O (lowest setting on this trainer).   In addition to pt's INCREASED RISK OF ASPIRATION, when introducing the EMST trainer, pt's brother stated, "oh now you are going to have to do something other than sit and do nothing. I even found a pistol under your pillow." After discussion between pt and his brother, pt admitted to  [place] ideation. [place] screening provided, see pt responses below. After screening, pt expressed desire to go to ED to "get help because, I am a burden to June (pt's wife).    OF NOTE, PAST MEDICAL HISTORY INCLUDES: Depression 1980'sApril 07, 2004  MDD---even needed hospitalization      Screening for Suicide  Answer the following questions with Yes or No and place an "x" beside the action taken.  1. Over the past two weeks, have you felt down, depressed, or hopeless?   Yes  2. Within the past two weeks, have you felt little interest or pleasure in life?  Yes  If YES to either #1 or #2, then ask #3  3. Have you had thoughts that that life is not worth living or that you might be       better off dead?   Yes  If answer is NO and suspicion is low, then end   4. Over this past week, have you had any thoughts about hurting or even killing yourself?  Yes  If NO, then end. Patient in no immediate danger   5. If so, do you believe that you intend to or will  harm yourself?  Yes     If NO, then end. Patient in no immediate danger   6.  Do you have a plan as to how you would hurt yourself?  Yes, off and on   7.  Over this past week, have you actually done anything to hurt yourself? "No but I am thinking about it."   IF YES answers to either #4, #5, #6 or #7, then patient is AT RISK for suicide   Actions Taken  ____  Screening negative; no further action required  ____  Screening positive; no immediate danger and patient already in treatment with a  mental health provider. Advise patient to speak to their mental health provider.  ____  Screening positive; no immediate danger. Patient advised to contact a mental  health provider for further assessment.   __X__  Screening positive; in immediate danger as patient states intention of killing self,  has plan and a sense of imminence. Do not leave alone. Seek permission from  patient to contact a family member to inform them. Direct patient to go to ED.   This Probation officer provided the above information to triage nurse and personally called pt's wife informing her that pt was in triage at the ED.    In addition to current suicide ideation, pt is at  [place]. Pt is in need of a Modified Barium Swallow Study for safe diet recommendations. Please consider prior to inpatient discharge.      Problem List Patient Active Problem List   Diagnosis Date Noted  . Pharyngeal dysphagia 07/18/2020  . 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 Sleep disturbance 03/12/2019  . Vertigo 03/02/2019  . Low back pain 03/13/2016  . 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 Fatigue 08/23/2014  . 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 Advanced directives, counseling/discussion 02/07/2014  . Routine general  medical examination at a health care facility 01/19/2013  . Osteoarthritis of both knees   . MDD (major depressive disorder), recurrent episode, moderate ([place])   . BPH (benign prostatic hyperplasia)    Happi B. Rutherford Nail M.S., CCC-SLP, [place] Pathologist Rehabilitation Services Office [phone]  [name] 08/29/2020, 10:36 AM  [place] [facility] SERVICES [address] [place], [place], [address] Phone: [phone]   Fax:  [phone]   Name: WORLEY RADERMACHER MRN: [id number] Date of Birth: February 29, 1946

## 2020-08-29 NOTE — H&P (Signed)
History and Physical    ADIEN KIMMEL TZG:[id number] DOB: [birth year] DOA: 08/29/2020  Referring MD/NP/PA:   PCP: Venia Carbon, MD   Patient coming from:  The patient is coming from home.  At baseline, pt is independent for most of ADL.        Chief Complaint: Suicidal ideation and dysphagia  HPI: Keith Hall is a 75 y.o. male with medical history significant of HLD, stroke with left-sided weakness and ICH, dysphagia, GERD, depression, anxiety, BPH, kidney stone, vertigo, who presents with suicidal ideation and dysphagia.  Pt has hx of stroke with dysphagia. Today pt was in seen in outpatient speech therapy clinic for evaluation of his dysphagia and choking episode. The speech therapist reports that pt expressed suicidal ideation. Pt states that he has a handgun in his bedroom and was think about shooting himself with it.  He states he is under a lot of stress because he does feel like his wife is helping him too much. He denies any HI or hallucinations. Per his wife, suicidal ideation is not a new issue, patient has been having intermittent suicidal ideation since 63s.  He had ECT treatment in [facility] in the past.  Per report, pt has generalized decline in ADLs over the past month. He has depression. He is currently taking Celexa and has been on Wellbutrin in the past but does not think he is currently taking it.   Patient has dysphagia.  He states that he has difficulty swallowing and some pain on swallowing.  Denies chest pain, cough, shortness breath.  No fever or chills.  Denies nausea vomiting, diarrhea or abdominal pain.  No symptoms of UTI.  He has chronic left-sided weakness from previous stroke which has not changed.  ED Course: pt was found to have WBC 7.3, pending COVID-19 PCR, pending Tylenol level, salicylate level, alcohol level less than 10, abnormal liver function (ALP 83, AST 265, ALT 219, total bilirubin 0.7), temperature normal, blood pressure 116/94, heart rate 86,  RR 16, oxygen saturation 96% on room air.  Patient is placed on MedSurg bed for observation. Dr. Weber Cooks of psychiatry is consulted.  Patient is IVC.  Review of Systems:   General: no fevers, chills, no body weight gain, has fatigue HEENT: no blurry vision, hearing changes or sore throat Respiratory: no dyspnea, coughing, wheezing CV: no chest pain, no palpitations GI: no nausea, vomiting, abdominal pain, diarrhea, constipation.  Has pain on swallowing. GU: no dysuria, burning on urination, increased urinary frequency, hematuria  Ext: no leg edema Neuro: no vision change or hearing loss.  Has dysphagia.  Has left-sided weakness Skin: no rash, no skin tear. MSK: No muscle spasm, no deformity, no limitation of range of movement in spin Heme: No easy bruising.  Travel history: No recent long distant travel. Psychiatry: Has suicidal ideation  Allergy:  Allergies  Allergen Reactions  . Atorvastatin Other (See Comments)    Elevated LFTs  . Penicillins Hives, Swelling and Rash    Did it involve swelling of the face/tongue/throat, SOB, or low BP? Yes Did it involve sudden or severe rash/hives, skin peeling, or any reaction on the inside of your mouth or nose? Yes Did you need to seek medical attention at a hospital or doctor's office? Yes When did it last happen?15 years If all above answers are "NO", may proceed with cephalosporin use.    Past Medical History:  Diagnosis Date  . BPH (benign prostatic hypertrophy)   . Depression 1980's- 2004  MDD---even needed hospitalization  . 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 Kidney stone 1980 only  . Osteoarthritis of both knees   . Stroke ([place])   . Sudden cardiac death [facility]) October 01, 1999   no clear etiology of this    Past Surgical History:  Procedure Laterality Date  . CARDIAC CATHETERIZATION  last in 2007-10-01   insignificant coronary disease  . CARDIOVASCULAR STRESS TEST  07/09/11    Negative--Dr Paraschos  . HERNIA REPAIR  10-01-2010   left inguinal--Dr Byrnett  . ROTATOR CUFF REPAIR  2007/10/01   both done by Dr Margaretmary Eddy  . TOTAL KNEE ARTHROPLASTY Right 2/14   Dr Marry Guan  . TOTAL KNEE ARTHROPLASTY Left 1/15   Dr [facility]    Social History:  reports that he has never smoked. He has never used smokeless tobacco. He reports that he does not drink alcohol and does not use drugs.  Family History:  Family History  Problem Relation Age of Onset  . Stroke Mother   . COPD Sister   . Diabetes Son        Type 1  . Heart disease Neg Hx   . Hypertension Neg Hx      Prior to Admission medications   Medication Sig Start Date End Date Taking? Authorizing Provider  acetaminophen (TYLENOL) 500 MG tablet Take 500 mg by mouth 3 (three) times daily as needed. 06/21/14   [provider]  aspirin 81 MG chewable tablet Chew 81 mg by mouth daily.    [provider]  buPROPion (WELLBUTRIN SR) 150 MG 12 hr tablet TAKE 1 TABLET (150 MG TOTAL) BY MOUTH 2 (TWO) TIMES DAILY EVERY MORNING/AFTER LUNCH 04/03/20   Viviana Simpler I, MD  citalopram (CELEXA) 20 MG tablet TAKE 1 TABLET (20 MG TOTAL) BY MOUTH DAILY. 11/22/19   Venia Carbon, MD  diazepam (VALIUM) 2 MG tablet Take 1-2 tablets (2-4 mg total) by mouth 3 (three) times daily as needed (vertigo). Patient not taking: No sig reported 03/14/19   Venia Carbon, MD  Glucosamine-Chondroitin (MOVE FREE PO) Take 2 tablets by mouth daily.    [provider]  glycopyrrolate (ROBINUL) 1 MG tablet Take 1 tablet (1 mg total) by mouth 3 (three) times daily. 08/22/20   Venia Carbon, MD  meclizine (ANTIVERT) 25 MG tablet Take 1 tablet (25 mg total) by mouth 3 (three) times daily as needed for dizziness or nausea. Patient not taking: No sig reported 03/12/19   Viviana Simpler I, MD  Melatonin 3-10 MG TABS Take by mouth at bedtime. Takes 20 mg at bedtime    [provider]  Multiple Vitamin (MULTIVITAMIN) tablet  Take 2 tablets by mouth daily.     [provider]  MYRBETRIQ 50 MG TB24 tablet TAKE 1 TABLET BY MOUTH EVERY DAY 08/15/20   Billey Co, MD  ondansetron (ZOFRAN-ODT) 4 MG disintegrating tablet Take 1 tablet (4 mg total) by mouth every 8 (eight) hours as needed for nausea or vomiting. 08/03/20   Venia Carbon, MD  pantoprazole (PROTONIX) 40 MG tablet Take 1 tablet (40 mg total) by mouth 2 (two) times daily. 08/03/20   Venia Carbon, MD  rosuvastatin (CRESTOR) 5 MG tablet TAKE 1 TABLET BY MOUTH EVERY DAY Patient not taking: Reported on 08/03/2020 05/17/20   Venia Carbon, MD    Physical Exam: Vitals:   08/29/20 1111 08/29/20 1117 08/29/20 1330  BP: Marland Kitchen)  116/94    Pulse: 86  72  Resp: 16  20  Temp: 97.7 F (36.5 C)    TempSrc: Oral    SpO2: 96%  93%  Weight:  81.6 kg   Height:  5\' 9"  (1.753 m)    General: Not in acute distress HEENT:       Eyes: PERRL, EOMI, no scleral icterus.       ENT: No discharge from the ears and nose, no pharynx injection, no tonsillar enlargement.        Neck: No JVD, no bruit, no mass felt. Heme: No neck lymph node enlargement. Cardiac: S1/S2, RRR, No murmurs, No gallops or rubs. Respiratory: No rales, wheezing, rhonchi or rubs. GI: Soft, nondistended, nontender, no rebound pain, no organomegaly, BS present. GU: No hematuria Ext: No pitting leg edema bilaterally. 1+DP/PT pulse bilaterally. Musculoskeletal: No joint deformities, No joint redness or warmth, no limitation of ROM in spin. Skin: No rashes.  Neuro: Alert, oriented X3, cranial nerves II-XII grossly intact, has left-sided weakness Psych: Psychiatry: Has suicidal ideation, No hemocidal ideation.  Labs on Admission: I have personally reviewed following labs and imaging studies  CBC: Recent Labs  Lab 08/29/20 1132  WBC 7.3  HGB 14.1  HCT 43.1  MCV 89.2  PLT 527   Basic Metabolic Panel: Recent Labs  Lab 08/29/20 1132  NA 137  K 4.6  CL 103  CO2 26  GLUCOSE 102*   BUN 18  CREATININE 0.60*  CALCIUM 8.9   GFR: Estimated Creatinine Clearance: 78.6 mL/min (A) (by C-G formula based on SCr of 0.6 mg/dL (L)). Liver Function Tests: Recent Labs  Lab 08/29/20 1132  AST 265*  ALT 219*  ALKPHOS 83  BILITOT 0.7  PROT 7.0  ALBUMIN 3.8   No results for input(s): LIPASE, AMYLASE in the last 168 hours. No results for input(s): AMMONIA in the last 168 hours. Coagulation Profile: No results for input(s): INR, PROTIME in the last 168 hours. Cardiac Enzymes: No results for input(s): CKTOTAL, CKMB, CKMBINDEX, TROPONINI in the last 168 hours. BNP (last 3 results) No results for input(s): PROBNP in the last 8760 hours. HbA1C: No results for input(s): HGBA1C in the last 72 hours. CBG: No results for input(s): GLUCAP in the last 168 hours. Lipid Profile: No results for input(s): CHOL, HDL, LDLCALC, TRIG, CHOLHDL, LDLDIRECT in the last 72 hours. Thyroid Function Tests: No results for input(s): TSH, T4TOTAL, FREET4, T3FREE, THYROIDAB in the last 72 hours. Anemia Panel: No results for input(s): VITAMINB12, FOLATE, FERRITIN, TIBC, IRON, RETICCTPCT in the last 72 hours. Urine analysis:    Component Value Date/Time   COLORURINE YELLOW (A) 03/01/2019 1545   APPEARANCEUR Clear 05/25/2020 1332   LABSPEC 1.009 03/01/2019 1545   LABSPEC 1.010 07/04/2014 2303   PHURINE 6.0 03/01/2019 1545   GLUCOSEU Negative 05/25/2020 1332   GLUCOSEU Negative 07/04/2014 2303   HGBUR NEGATIVE 03/01/2019 1545   BILIRUBINUR Negative 05/25/2020 1332   BILIRUBINUR Negative 07/04/2014 2303   KETONESUR 5 (A) 03/01/2019 1545   PROTEINUR Negative 05/25/2020 1332   PROTEINUR NEGATIVE 03/01/2019 1545   UROBILINOGEN negative 08/23/2014 1244   NITRITE Negative 05/25/2020 1332   NITRITE NEGATIVE 03/01/2019 1545   LEUKOCYTESUR Negative 05/25/2020 1332   LEUKOCYTESUR NEGATIVE 03/01/2019 1545   LEUKOCYTESUR Negative 07/04/2014 2303   Sepsis  Labs: @LABRCNTIP (procalcitonin:4,lacticidven:4) ) Recent Results (from the past 240 hour(s))  Resp Panel by RT-PCR (Flu A&B, Covid) Nasopharyngeal Swab     Status: None   Collection Time: 08/29/20 12:07  PM   Specimen: Nasopharyngeal Swab; Nasopharyngeal(NP) swabs in vial transport medium  Result Value Ref Range Status   SARS Coronavirus 2 by RT PCR NEGATIVE NEGATIVE Final    Comment: (NOTE) SARS-CoV-2 target nucleic acids are NOT DETECTED.  The SARS-CoV-2 RNA is generally detectable in upper respiratory specimens during the acute phase of infection. The lowest concentration of SARS-CoV-2 viral copies this assay can detect is 138 copies/mL. A negative result does not preclude SARS-Cov-2 infection and should not be used as the sole basis for treatment or other patient management decisions. A negative result may occur with  improper specimen collection/handling, submission of specimen other than nasopharyngeal swab, presence of viral mutation(s) within the areas targeted by this assay, and inadequate number of viral copies(<138 copies/mL). A negative result must be combined with clinical observations, patient history, and epidemiological information. The expected result is Negative.  Fact Sheet for Patients:  EntrepreneurPulse.com.au  Fact Sheet for Healthcare Providers:  IncredibleEmployment.be  This test is no t yet approved or cleared by the [place] FDA and  has been authorized for detection and/or diagnosis of SARS-CoV-2 by FDA under an Emergency Use Authorization (EUA). This EUA will remain  in effect (meaning this test can be used) for the duration of the COVID-19 declaration under Section 564(b)(1) of the Act, 21 U.S.C.section 360bbb-3(b)(1), unless the authorization is terminated  or revoked sooner.       Influenza A by PCR NEGATIVE NEGATIVE Final   Influenza B by PCR NEGATIVE NEGATIVE Final    Comment: (NOTE) The Xpert Xpress  SARS-CoV-2/FLU/RSV plus assay is intended as an aid in the diagnosis of influenza from Nasopharyngeal swab specimens and should not be used as a sole basis for treatment. Nasal washings and aspirates are unacceptable for Xpert Xpress SARS-CoV-2/FLU/RSV testing.  Fact Sheet for Patients: EntrepreneurPulse.com.au  Fact Sheet for Healthcare Providers: IncredibleEmployment.be  This test is not yet approved or cleared by the [place] FDA and has been authorized for detection and/or diagnosis of SARS-CoV-2 by FDA under an Emergency Use Authorization (EUA). This EUA will remain in effect (meaning this test can be used) for the duration of the COVID-19 declaration under Section 564(b)(1) of the Act, 21 U.S.C. section 360bbb-3(b)(1), unless the authorization is terminated or revoked.  Performed at [facility], [address], [place], [place] [address]      Radiological Exams on Admission: No results found.   EKG: Reviewed independently.  Sinus rhythm, QTC 453, LAD, poor R wave progression, incomplete right bundle blockage   Assessment/Plan Principal Problem:   Dysphagia Active Problems:   BPH (benign prostatic hyperplasia)   Abnormal LFTs   HLD (hyperlipidemia)   Stroke (HCC)   GERD (gastroesophageal reflux disease)   Depression with anxiety   Suicidal ideation   Dysphagia: this is due to recent stroke.  Speech therapist, [name] is on board. She scheduled modified barium swallow study tomorrow 8 AM.  She recommended strict n.p.o. today before study is done.  -place in med-surg bed for obs -NPO  -IVF:NS at 75 cc/h -Hold oral medications -continue Robinul by IV, 0.1 mg tid  BPH (benign prostatic hyperplasia) -Hold Myrbetriq  Abnormal LFTs: -Avoid using Tylenol -Check tendon level -Hepatitis panel -HIV antibody  HLD (hyperlipidemia) -hold Crestor  Stroke (HCC) -Hold aspirin and Crestor today  GERD  (gastroesophageal reflux disease) -IV Protonix  Depression with anxiety -Hold home oral Celexa and Wellbutrin -Follow-up psychiatry's recommendation  Suicidal ideation -Suicidal precaution -Follow-up Dr. Lasandra Beech recommendation -Patient is IVC'ed  DVT ppx: SQ Heparin   Code Status: DNR who is patient's the power of attorney currently Family Communication:  Yes, patient's wife by phone Disposition Plan:  Anticipate discharge back to previous environment Consults called: Dr. Weber Cooks of psychiatry Admission status and Level of care: Med-Surg: for obs      Status is: Observation  The patient remains OBS appropriate and will d/c before 2 midnights.  Dispo: The patient is from: Home              Anticipated d/c is to: to be determined, may be to [facility]               Patient currently is not medically stable to d/c.   Difficult to place patient No          Date of Service 08/29/2020    [place] [facility]   If 7PM-7AM, please contact night-coverage [email or web address] 08/29/2020, 1:40 PM

## 2020-08-29 NOTE — ED Notes (Signed)
IVC  SEEN  BY  DR  [facility] MD  PENDING  PLACEMENT

## 2020-08-29 NOTE — ED Notes (Signed)
Resumed  Care from [facility].  Pt alert  Condom cath in place, no urine yet.  Iv fluids infusing.  No acute distress.  Pt watching tv.

## 2020-08-29 NOTE — ED Notes (Signed)
Pt unable to eat lunch tray served due to difficulty swallowing

## 2020-08-29 NOTE — ED Triage Notes (Signed)
Pt brought over by outpatient ST, states while evaluating the pt for recently choking on Saturday and a generalized decline in ADLs over the past month, speech therapist reports that pt expressed SI and states he sleeps with a gun under his pillow.

## 2020-08-29 NOTE — Telephone Encounter (Signed)
I reached out to pt's wife Keith Hall. I provided information from pt's Outpatient session and current plan to perform a Modified Barium Swallow Study tomorrow at 8am.   Listening ear provided as wife described pt's progressive decline that has accelerated in the last several weeks.    [name] B. Rutherford Nail M.S., [place], [place] Office [phone]

## 2020-08-29 NOTE — Progress Notes (Signed)
Patient's wife would like to be notified when patient goes to the floor. Her contact numbers are [phone] (HOME), [phone] (CELL)
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
## 2020-08-29 NOTE — ED Notes (Signed)
IVC PENDING  CONSULT ?

## 2020-08-29 NOTE — Progress Notes (Signed)
  Speech Language Pathology Follow up  Patient Details Name: Keith Hall MRN: [id number] DOB: 27-Feb-1946 Today's Date: 08/29/2020     Pt's dysphagia was evaluated earlier today while an Outpatient with this Probation officer.   Pt is appropriate for an instrumental swallow study given his worsening dysphagia since recent MBSS on 08/02/2020. Given difficulty in current chart, remain NPO until completion of instrumental study.   MBSS scheduled for 08/30/2020 at 0800.   [name] B. Rutherford Nail M.S., CCC-SLP, [place] Office [phone]  [name] 08/29/2020, 1:06 PM

## 2020-08-29 NOTE — ED Notes (Signed)
Condom cath in place.  Pt not voided yet.  No abd pain. Iv fluids infusing.  Pt alert.  Pt waiting on admission.  Pt is IVC.  Pt calm and cooperative.
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
## 2020-08-29 NOTE — ED Notes (Signed)
meds given  Pt swallowed pills without diff
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
## 2020-08-30 ENCOUNTER — Encounter: Payer: PPO | Admitting: Speech Pathology

## 2020-08-30 ENCOUNTER — Observation Stay: Payer: PPO

## 2020-08-30 DIAGNOSIS — R945 Abnormal results of liver function studies: Secondary | ICD-10-CM

## 2020-08-30 DIAGNOSIS — Z79899 Other long term (current) drug therapy: Secondary | ICD-10-CM | POA: Diagnosis not present

## 2020-08-30 DIAGNOSIS — Z7189 Other specified counseling: Secondary | ICD-10-CM | POA: Diagnosis not present

## 2020-08-30 DIAGNOSIS — Z515 Encounter for palliative care: Secondary | ICD-10-CM

## 2020-08-30 DIAGNOSIS — Z66 Do not resuscitate: Secondary | ICD-10-CM | POA: Diagnosis present

## 2020-08-30 DIAGNOSIS — Z96653 Presence of artificial knee joint, bilateral: Secondary | ICD-10-CM | POA: Diagnosis present

## 2020-08-30 DIAGNOSIS — F419 Anxiety disorder, unspecified: Secondary | ICD-10-CM | POA: Diagnosis present

## 2020-08-30 DIAGNOSIS — Z825 Family history of asthma and other chronic lower respiratory diseases: Secondary | ICD-10-CM | POA: Diagnosis not present

## 2020-08-30 DIAGNOSIS — Z7982 Long term (current) use of aspirin: Secondary | ICD-10-CM | POA: Diagnosis not present

## 2020-08-30 DIAGNOSIS — R748 Abnormal levels of other serum enzymes: Secondary | ICD-10-CM | POA: Diagnosis present

## 2020-08-30 DIAGNOSIS — I69351 Hemiplegia and hemiparesis following cerebral infarction affecting right dominant side: Secondary | ICD-10-CM | POA: Diagnosis not present

## 2020-08-30 DIAGNOSIS — B379 Candidiasis, unspecified: Secondary | ICD-10-CM | POA: Diagnosis present

## 2020-08-30 DIAGNOSIS — F332 Major depressive disorder, recurrent severe without psychotic features: Secondary | ICD-10-CM | POA: Diagnosis present

## 2020-08-30 DIAGNOSIS — Z880 Allergy status to penicillin: Secondary | ICD-10-CM | POA: Diagnosis not present

## 2020-08-30 DIAGNOSIS — Z833 Family history of diabetes mellitus: Secondary | ICD-10-CM | POA: Diagnosis not present

## 2020-08-30 DIAGNOSIS — B370 Candidal stomatitis: Secondary | ICD-10-CM

## 2020-08-30 DIAGNOSIS — Z888 Allergy status to other drugs, medicaments and biological substances status: Secondary | ICD-10-CM | POA: Diagnosis not present

## 2020-08-30 DIAGNOSIS — Z20822 Contact with and (suspected) exposure to covid-19: Secondary | ICD-10-CM | POA: Diagnosis present

## 2020-08-30 DIAGNOSIS — R1310 Dysphagia, unspecified: Secondary | ICD-10-CM | POA: Diagnosis present

## 2020-08-30 DIAGNOSIS — R45851 Suicidal ideations: Secondary | ICD-10-CM | POA: Diagnosis present

## 2020-08-30 DIAGNOSIS — R059 Cough, unspecified: Secondary | ICD-10-CM | POA: Diagnosis not present

## 2020-08-30 DIAGNOSIS — N400 Enlarged prostate without lower urinary tract symptoms: Secondary | ICD-10-CM | POA: Diagnosis present

## 2020-08-30 DIAGNOSIS — F418 Other specified anxiety disorders: Secondary | ICD-10-CM | POA: Diagnosis not present

## 2020-08-30 DIAGNOSIS — J3800 Paralysis of vocal cords and larynx, unspecified: Secondary | ICD-10-CM

## 2020-08-30 DIAGNOSIS — I69391 Dysphagia following cerebral infarction: Secondary | ICD-10-CM | POA: Diagnosis not present

## 2020-08-30 DIAGNOSIS — I639 Cerebral infarction, unspecified: Secondary | ICD-10-CM | POA: Diagnosis not present

## 2020-08-30 DIAGNOSIS — Z823 Family history of stroke: Secondary | ICD-10-CM | POA: Diagnosis not present

## 2020-08-30 DIAGNOSIS — M170 Bilateral primary osteoarthritis of knee: Secondary | ICD-10-CM | POA: Diagnosis present

## 2020-08-30 DIAGNOSIS — E785 Hyperlipidemia, unspecified: Secondary | ICD-10-CM | POA: Diagnosis present

## 2020-08-30 DIAGNOSIS — K219 Gastro-esophageal reflux disease without esophagitis: Secondary | ICD-10-CM | POA: Diagnosis present

## 2020-08-30 LAB — BASIC METABOLIC PANEL
Anion gap: 8 (ref 5–15)
BUN: 15 mg/dL (ref 8–23)
CO2: 27 mmol/L (ref 22–32)
Calcium: 8.6 mg/dL — ABNORMAL LOW (ref 8.9–10.3)
Chloride: 105 mmol/L (ref 98–111)
Creatinine, Ser: 0.66 mg/dL (ref 0.61–1.24)
GFR, Estimated: >60 mL/min (ref >60)
Glucose, Bld: 74 mg/dL (ref 70–99)
Potassium: 4.1 mmol/L (ref 3.5–5.1)
Sodium: 140 mmol/L (ref 135–145)

## 2020-08-30 LAB — CBC
HCT: 36.8 % — ABNORMAL LOW (ref 39.0–52.0)
Hemoglobin: 11.9 g/dL — ABNORMAL LOW (ref 13.0–17.0)
MCH: 29.5 pg (ref 26.0–34.0)
MCHC: 32.3 g/dL (ref 30.0–36.0)
MCV: 91.1 fL (ref 80.0–100.0)
Platelets: 148 10*3/uL — ABNORMAL LOW (ref 150–400)
RBC: 4.04 MIL/uL — ABNORMAL LOW (ref 4.22–5.81)
RDW: 14.4 % (ref 11.5–15.5)
WBC: 6.2 10*3/uL (ref 4.0–10.5)
nRBC: 0 % (ref 0.0–0.2)

## 2020-08-30 LAB — GLUCOSE, CAPILLARY
Glucose-Capillary: 75 mg/dL (ref 70–99)
Glucose-Capillary: 81 mg/dL (ref 70–99)
Glucose-Capillary: 70 mg/dL (ref 70–99)

## 2020-08-30 LAB — URINE DRUG SCREEN, QUALITATIVE (ARMC ONLY)
Amphetamines, Ur Screen: NOT DETECTED
Barbiturates, Ur Screen: NOT DETECTED
Benzodiazepine, Ur Scrn: NOT DETECTED
Cannabinoid 50 Ng, Ur ~~LOC~~: NOT DETECTED
Cocaine Metabolite,Ur ~~LOC~~: NOT DETECTED
MDMA (Ecstasy)Ur Screen: NOT DETECTED
Methadone Scn, Ur: NOT DETECTED
Opiate, Ur Screen: NOT DETECTED
Phencyclidine (PCP) Ur S: NOT DETECTED
Tricyclic, Ur Screen: NOT DETECTED

## 2020-08-30 MED ORDER — ENSURE ENLIVE PO LIQD
237.0000 mL | Freq: Two times a day (BID) | ORAL | Status: DC
  Administered 2020-09-01: 237 mL via ORAL

## 2020-08-30 MED ORDER — FLUCONAZOLE IN SODIUM CHLORIDE 200-0.9 MG/100ML-% IV SOLN
200.0000 mg | Freq: Once | INTRAVENOUS | Status: AC
  Administered 2020-08-30: 200 mg via INTRAVENOUS
  Filled 2020-08-30: qty 100

## 2020-08-30 MED ORDER — IPRATROPIUM-ALBUTEROL 0.5-2.5 (3) MG/3ML IN SOLN
3.0000 mL | Freq: Four times a day (QID) | RESPIRATORY_TRACT | Status: DC
  Administered 2020-08-30 – 2020-09-01 (×8): 3 mL via RESPIRATORY_TRACT
  Filled 2020-08-30 (×8): qty 3

## 2020-08-30 MED ORDER — DEXTROSE-NACL 5-0.9 % IV SOLN: INTRAVENOUS | Status: DC

## 2020-08-30 MED ORDER — FLUCONAZOLE 100MG IVPB
100.0000 mg | INTRAVENOUS | Status: DC
  Administered 2020-09-01: 11:00:00 100 mg via INTRAVENOUS
  Filled 2020-08-30 (×4): qty 50

## 2020-08-30 NOTE — Progress Notes (Signed)
Modified Barium Swallow Progress Note  Patient Details  Name: Keith Hall MRN: [id number] Date of Birth: August 07, 1945  Today's Date: 08/30/2020  Modified Barium Swallow completed.  Full report located under Chart Review in the Imaging Section.  Brief recommendations include the following:  Clinical Impression  Pt presents with worsening oropharyngeal dysphagia that results in gross aspiration of bolus. While pt's oral phase is mildly impaired, his pharyngeal phase impairments are profound. Pt's oral phase is c/b mild oral holding and premature spillage to the vallecula. Pt's profound pharyngeal phase deficits are c/b swallow initiation at the pyriform sinsus but his severely diminished hyolaryngeal movement is more concerning as his lack of hyolaryngeal movement fails to open his UES. This results in gross aspiration of bolus during the swallow. While pt did have a cough response, his cough was ineffective in clearing aspirate and he became asphyiated. Pt was able to recover without intervention but his voice remained very wet and he continued to have pharyngeal residue throughout pharynx as well as laryngeal vestibule.   At this time, pt is unsafe to consume any POs.  Prognosis for improvement is considered poor in light of pt's rapid decline since previous MBSS (Feb. 2022). At this time, recommend consult to Palliative Care to determine [place]. If a PEG aligns with pt's GOC, it is important to consider that a PEG will not prevent aspiration of pt's salvia. At baseline, pt wife reports, "he gets choked on his spit, he gurgles all the time, sometimes he can't breath because he is choking on his own spit."   Swallow Evaluation Recommendations       SLP Diet Recommendations: NPO       Medication Administration: Via alternative means               Oral Care Recommendations: Oral care QID   Other Recommendations: Have oral suction available   Blaike Vickers B. Rutherford Nail M.S., CCC-SLP,  [place] Pathologist Rehabilitation Services Office [phone]  Dessirae Scarola Rutherford Nail 08/30/2020,9:34 AM

## 2020-08-30 NOTE — Consult Note (Addendum)
Consultation Note Date: 08/30/2020   Patient Name: Keith Hall  DOB: 1945-09-15  MRN: [id number]  Age / Sex: 75 y.o., male  PCP: Venia Carbon, MD Referring Physician: Loletha Grayer, MD  Reason for Consultation: Establishing goals of care  HPI/Patient Profile: Keith Hall is a 75 y.o. male with medical history significant of HLD, stroke with left-sided weakness and ICH, dysphagia, GERD, depression, anxiety, BPH, kidney stone, vertigo, who presents with suicidal ideation and dysphagia.  Clinical Assessment and Goals of Care: Patient is resting in bed with his brother at bedside. He in under IVC.  Spoke with his wife who is his Air traffic controller. She is understandably tearful and immediately states "he is not ready for hospice". She states he had a stroke in 2015 resulting in L side paralysis. She states in October, he woke up once morning with R side weakness. She states with work up, there was no cause found including a new CVA. She states in December he developed urinary incontinence. In January, he began having difficulty with swallowing. We discussed the progressive nature of his neurologic changes. We discussed a neurology consult as they have a current appointment scheduled for June.    We discussed his diagnosis, prognosis, GOC, EOL wishes disposition and options.  A detailed discussion was had today regarding advanced directives.  Concepts specific to code status, artifical feeding and hydration, IV antibiotics and rehospitalization were discussed.  The difference between an aggressive medical intervention path and a comfort care path was discussed.  Values and goals of care important to patient and family were attempted to be elicited.  Discussed limitations of medical interventions to prolong quality of life in some situations and discussed the concept of human mortality.  We  discussed his swallowing deficits and what he would want if he were thinking as he usually does. She states that this answer is easy; he has been ready to die since his stroke in 2015. She states given this they would want nothing artificial. Discussed QOL past, present, future. After discussion, she states she would like to speak with her son regarding transitioning to comfort care. Discussed his prognosis given his steady decline that became rapid since October, and extremely poor swallow evaluation. She states she would like to speak with her son and meet tomorrow at 1:00 for further conversation about hospice care.           SUMMARY OF RECOMMENDATIONS   Remeet at 1:00 tomorrow. Leaning toward hospice facility.   Prognosis:   Very poor.       Primary Diagnoses: Present on Admission: . BPH (benign prostatic hyperplasia) . Abnormal LFTs . HLD (hyperlipidemia) . Stroke ([place]) . GERD (gastroesophageal reflux disease) . Depression with anxiety   I have reviewed the medical record, interviewed the patient and family, and examined the patient. The following aspects are pertinent.  Past Medical History:  Diagnosis Date  . BPH (benign prostatic hypertrophy)   . Depression 1980's- 2004   MDD---even needed hospitalization  . 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 Kidney stone 1980 only  . Osteoarthritis of both knees   . Stroke ([place])   . Sudden cardiac death [facility]) 10/04/1999   no clear etiology of this   Social History   Socioeconomic History  . Marital status: Married    Spouse name: Not on file  . Number of children: 1  . Years of education: Not on file  . Highest education level: Not on file  Occupational History  . Occupation: Outside Oncologist    Comment: Retired at 35  [employer]  . Smoking status: Never Smoker  . Smokeless tobacco: Never Used  Substance and Sexual Activity  . Alcohol use: No  . Drug use:  No  . Sexual activity: Not on file  Other Topics Concern  . Not on file  Social History Narrative   1 son   Has living will   Wife, then son, have health care POA   Would accept resuscitation attempts but no prolonged artificial ventilation   Would not want tube feeds if cognitively unaware   Social Determinants of Health   Financial Resource Strain: Low Risk   . Difficulty of Paying Living Expenses: Not very hard  Food Insecurity: Not on file  Transportation Needs: Not on file  Physical Activity: Not on file  Stress: Not on file  Social Connections: Not on file   Family History  Problem Relation Age of Onset  . Stroke Mother   . COPD Sister   . Diabetes Son        Type 1  . Heart disease Neg Hx   . Hypertension Neg Hx    Scheduled Meds: . buPROPion  300 mg Oral Daily  . citalopram  20 mg Oral Daily  . glycopyrrolate  0.1 mg Intravenous TID  . heparin  5,000 Units Subcutaneous Q8H  . ipratropium-albuterol  3 mL Nebulization Q6H  . OLANZapine  5 mg Oral QHS  . pantoprazole (PROTONIX) IV  40 mg Intravenous Q12H   Continuous Infusions: . dextrose 5 % and 0.9% NaCl 50 mL/hr at 08/30/20 1227  . [START ON 08/31/2020] fluconazole (DIFLUCAN) IV     PRN Meds:.dextrose, ondansetron (ZOFRAN) IV Medications Prior to Admission:  Prior to Admission medications   Medication Sig Start Date End Date Taking? Authorizing Provider  acetaminophen (TYLENOL) 500 MG tablet Take 500 mg by mouth 3 (three) times daily as needed. 06/21/14  Yes [provider]  aspirin 81 MG chewable tablet Chew 81 mg by mouth daily.   Yes [provider]  buPROPion (WELLBUTRIN SR) 150 MG 12 hr tablet TAKE 1 TABLET (150 MG TOTAL) BY MOUTH 2 (TWO) TIMES DAILY EVERY MORNING/AFTER LUNCH 04/03/20  Yes Viviana Simpler I, MD  citalopram (CELEXA) 20 MG tablet TAKE 1 TABLET (20 MG TOTAL) BY MOUTH DAILY. 11/22/19  Yes Venia Carbon, MD  glycopyrrolate (ROBINUL) 1 MG tablet Take 1 tablet (1 mg total) by  mouth 3 (three) times daily. 08/22/20  Yes Venia Carbon, MD  MYRBETRIQ 50 MG TB24 tablet TAKE 1 TABLET BY MOUTH EVERY DAY Patient taking differently: Take 50 mg by mouth daily. 08/15/20  Yes Billey Co, MD  pantoprazole (PROTONIX) 40 MG tablet Take 1 tablet (40 mg total) by mouth 2 (two) times daily. 08/03/20  Yes Venia Carbon, MD  polyethylene glycol (MIRALAX / GLYCOLAX) 17 g packet Take 17 g by mouth 2 (two) times daily.  Yes [provider]  Glucosamine-Chondroitin (MOVE FREE PO) Take 2 tablets by mouth daily. Patient not taking: Reported on 08/29/2020    [provider]  Multiple Vitamin (MULTIVITAMIN) tablet Take 2 tablets by mouth daily.  Patient not taking: Reported on 08/29/2020    [provider]  ondansetron (ZOFRAN-ODT) 4 MG disintegrating tablet Take 1 tablet (4 mg total) by mouth every 8 (eight) hours as needed for nausea or vomiting. 08/03/20   Venia Carbon, MD  rosuvastatin (CRESTOR) 5 MG tablet TAKE 1 TABLET BY MOUTH EVERY DAY Patient not taking: No sig reported 05/17/20   Venia Carbon, MD   Allergies  Allergen Reactions  . Atorvastatin Other (See Comments)    Elevated LFTs  . Penicillins Hives, Swelling and Rash    Did it involve swelling of the face/tongue/throat, SOB, or low BP? Yes Did it involve sudden or severe rash/hives, skin peeling, or any reaction on the inside of your mouth or nose? Yes Did you need to seek medical attention at a hospital or doctor's office? Yes When did it last happen?15 years If all above answers are "NO", may proceed with cephalosporin use.   Review of Systems  Unable to perform ROS   Physical Exam Pulmonary:     Effort: Pulmonary effort is normal.  Neurological:     Mental Status: He is alert.     Vital Signs: BP 117/64 (BP Location: Left Arm)   Pulse 89   Temp 99.1 F (37.3 C)   Resp 15   Ht 5\' 9"  (1.753 m)   Wt 81.6 kg   SpO2 92%   BMI 26.58 kg/m  Pain Scale: 0-10    Pain Score: 0-No pain   SpO2: SpO2: 92 % O2 Device:SpO2: 92 % O2 Flow Rate: .   IO: Intake/output summary:   Intake/Output Summary (Last 24 hours) at 08/30/2020 1457 Last data filed at 08/30/2020 0349 Gross per 24 hour  Intake -  Output 825 ml  Net -825 ml    LBM: Last BM Date: 08/29/20 Baseline Weight: Weight: 81.6 kg Most recent weight: Weight: 81.6 kg       Time In: 2:30 Time Out: 3:20 Time Total: 40 min Greater than 50%  of this time was spent counseling and coordinating care related to the above assessment and plan.  Signed by: Asencion Gowda, NP   Please contact Palliative Medicine Team phone at [phone] for questions and concerns.  For individual provider: See Shea Evans

## 2020-08-30 NOTE — Progress Notes (Signed)
Patient ID: Keith Hall, male   DOB: [birth year], 75 y.o.   MRN: [id number]  Spoke with the patient's wife on the phone.  She understands the risk if we feed him of aspiration.  She and the patient are willing to undergo the risk.  I will put on dysphagia diet with thickened liquids.  High risk of aspiration.  Family will meet again with palliative tomorrow to discuss hospice.  Dr Loletha Grayer

## 2020-08-30 NOTE — Consult Note (Signed)
[facility] Consult   Reason for Consult: Follow-up consult patient with severe depression and significant medical problems now unable to swallow Referring Physician:  Leslye Peer Patient Identification: Keith Hall MRN:  [id number] Principal Diagnosis: Severe recurrent major depression without psychotic features ([place]) Diagnosis:  Principal Problem:   Severe recurrent major depression without psychotic features ([place]) Active Problems:   BPH (benign prostatic hyperplasia)   Abnormal LFTs   HLD (hyperlipidemia)   Stroke ([place])   GERD (gastroesophageal reflux disease)   Depression with anxiety   Suicidal ideation   Dysphagia   Thrush   Vocal cord paralysis   Total Time spent with patient: 30 minutes  Subjective:   Keith Hall is a 75 y.o. male patient admitted with "I guess I am okay".  HPI: Patient seen chart reviewed.  This 75 year old man with a history of longstanding severe depression had been making what sounds like serious threats of suicide recently.  Now in the hospital being managed for his dysphagia and inability to swallow.  When I came by today he was eating a soft diet.  As I see in the notes it looks like patient and family have acknowledged the high risk of aspiration but are going to go with eating something.  Not clear to me if going forward the plan is definitely going to be for a feeding tube but the patient seems to think so.  He tells me today he is not having any suicidal thoughts.  Says he is not feeling overly depressed.  Seems a little subdued and slower  Past Psychiatric History: Longstanding depression multiple prior hospitalizations  Risk to Self:   Risk to Others:   Prior Inpatient Therapy:   Prior Outpatient Therapy:    Past Medical History:  Past Medical History:  Diagnosis Date  . BPH (benign prostatic hypertrophy)   . Depression 1980's03-26-2004   MDD---even needed hospitalization  . 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 Kidney stone 1980 only  . Osteoarthritis of both knees   . Stroke ([place])   . Sudden cardiac death [facility]) 09-10-99   no clear etiology of this    Past Surgical History:  Procedure Laterality Date  . CARDIAC CATHETERIZATION  last in 10-Sep-2007   insignificant coronary disease  . CARDIOVASCULAR STRESS TEST  07/09/11   Negative--Dr Paraschos  . HERNIA REPAIR  Sep 10, 2010   left inguinal--Dr Byrnett  . ROTATOR CUFF REPAIR  10-Sep-2007   both done by Dr Margaretmary Eddy  . TOTAL KNEE ARTHROPLASTY Right 2/14   Dr Marry Guan  . TOTAL KNEE ARTHROPLASTY Left 1/15   Dr [facility]   Family History:  Family History  Problem Relation Age of Onset  . Stroke Mother   . COPD Sister   . Diabetes Son        Type 1  . Heart disease Neg Hx   . Hypertension Neg Hx    Family Psychiatric  History: See previous Social History:  Social History   Substance and Sexual Activity  Alcohol Use No     Social History   Substance and Sexual Activity  Drug Use No    Social History   Socioeconomic History  . Marital status: Married    Spouse name: Not on file  . Number of children: 1  . Years of education: Not on file  . Highest education level: Not on file  Occupational History  . Occupation: Outside Press photographer of  furniture    Comment: Retired at 70  Tobacco Use  . Smoking status: Never Smoker  . Smokeless tobacco: Never Used  Substance and Sexual Activity  . Alcohol use: No  . Drug use: No  . Sexual activity: Not on file  Other Topics Concern  . Not on file  Social History Narrative   1 son   Has living will   Wife, then son, have health care POA   Would accept resuscitation attempts but no prolonged artificial ventilation   Would not want tube feeds if cognitively unaware   Social Determinants of Health   Financial Resource Strain: Low Risk   . Difficulty of Paying Living Expenses: Not very hard  Food Insecurity: Not on file  Transportation Needs: Not on file   Physical Activity: Not on file  Stress: Not on file  Social Connections: Not on file   Additional Social History:    Allergies:   Allergies  Allergen Reactions  . Atorvastatin Other (See Comments)    Elevated LFTs  . Penicillins Hives, Swelling and Rash    Did it involve swelling of the face/tongue/throat, SOB, or low BP? Yes Did it involve sudden or severe rash/hives, skin peeling, or any reaction on the inside of your mouth or nose? Yes Did you need to seek medical attention at a hospital or doctor's office? Yes When did it last happen?15 years If all above answers are "NO", may proceed with cephalosporin use.    Labs:  Results for orders placed or performed during the hospital encounter of 08/29/20 (from the past 48 hour(s))  Comprehensive metabolic panel     Status: Abnormal   Collection Time: 08/29/20 11:32 AM  Result Value Ref Range   Sodium 137 135 - 145 mmol/L   Potassium 4.6 3.5 - 5.1 mmol/L   Chloride 103 98 - 111 mmol/L   CO2 26 22 - 32 mmol/L   Glucose, Bld 102 (H) 70 - 99 mg/dL    Comment: Glucose reference range applies only to samples taken after fasting for at least 8 hours.   BUN 18 8 - 23 mg/dL   Creatinine, Ser 0.60 (L) 0.61 - 1.24 mg/dL   Calcium 8.9 8.9 - 10.3 mg/dL   Total Protein 7.0 6.5 - 8.1 g/dL   Albumin 3.8 3.5 - 5.0 g/dL   AST 265 (H) 15 - 41 U/L   ALT 219 (H) 0 - 44 U/L   Alkaline Phosphatase 83 38 - 126 U/L   Total Bilirubin 0.7 0.3 - 1.2 mg/dL   GFR, Estimated >60 >60 mL/min    Comment: (NOTE) Calculated using the CKD-EPI Creatinine Equation (2021)    Anion gap 8 5 - 15    Comment: Performed at [facility], [address] [place], [place] [address]  Ethanol     Status: None   Collection Time: 08/29/20 11:32 AM  Result Value Ref Range   Alcohol, Ethyl (B) <10 <10 mg/dL    Comment: (NOTE) Lowest detectable limit for serum alcohol is 10 mg/dL.  For medical purposes only. Performed at [facility], [address] [place], [place] [address]   Salicylate level     Status: Abnormal   Collection Time: 08/29/20 11:32 AM  Result Value Ref Range   Salicylate Lvl <9.6 (L) 7.0 - 30.0 mg/dL    Comment: Performed at [facility], [address] [place], [place] [address]  Acetaminophen level     Status: Abnormal   Collection Time:  08/29/20 11:32 AM  Result Value Ref Range   Acetaminophen (Tylenol), Serum <10 (L) 10 - 30 ug/mL    Comment: (NOTE) Therapeutic concentrations vary significantly. A range of 10-30 ug/mL  may be an effective concentration for many patients. However, some  are best treated at concentrations outside of this range. Acetaminophen concentrations >150 ug/mL at 4 hours after ingestion  and >50 ug/mL at 12 hours after ingestion are often associated with  toxic reactions.  Performed at [facility], [address] [place], [place] [address]   cbc     Status: None   Collection Time: 08/29/20 11:32 AM  Result Value Ref Range   WBC 7.3 4.0 - 10.5 K/uL   RBC 4.83 4.22 - 5.81 MIL/uL   Hemoglobin 14.1 13.0 - 17.0 g/dL   HCT 43.1 39.0 - 52.0 %   MCV 89.2 80.0 - 100.0 fL   MCH 29.2 26.0 - 34.0 pg   MCHC 32.7 30.0 - 36.0 g/dL   RDW 14.4 11.5 - 15.5 %   Platelets 202 150 - 400 K/uL   nRBC 0.0 0.0 - 0.2 %    Comment: Performed at [facility], [address], [place], [place] [address]  Hepatitis panel, acute     Status: None   Collection Time: 08/29/20 11:32 AM  Result Value Ref Range   Hepatitis B Surface Ag NON REACTIVE NON REACTIVE   HCV Ab NON REACTIVE NON REACTIVE    Comment: (NOTE) Nonreactive HCV antibody screen is consistent with no HCV infections,  unless recent infection is suspected or other evidence exists to indicate HCV infection.     Hep A IgM NON REACTIVE NON REACTIVE   Hep B C IgM NON REACTIVE NON REACTIVE    Comment: Performed at [facility], [place] [address] [place], [place] [address]  Resp Panel by RT-PCR (Flu  A&B, Covid) Nasopharyngeal Swab     Status: None   Collection Time: 08/29/20 12:07 PM   Specimen: Nasopharyngeal Swab; Nasopharyngeal(NP) swabs in vial transport medium  Result Value Ref Range   SARS Coronavirus 2 by RT PCR NEGATIVE NEGATIVE    Comment: (NOTE) SARS-CoV-2 target nucleic acids are NOT DETECTED.  The SARS-CoV-2 RNA is generally detectable in upper respiratory specimens during the acute phase of infection. The lowest concentration of SARS-CoV-2 viral copies this assay can detect is 138 copies/mL. A negative result does not preclude SARS-Cov-2 infection and should not be used as the sole basis for treatment or other patient management decisions. A negative result may occur with  improper specimen collection/handling, submission of specimen other than nasopharyngeal swab, presence of viral mutation(s) within the areas targeted by this assay, and inadequate number of viral copies(<138 copies/mL). A negative result must be combined with clinical observations, patient history, and epidemiological information. The expected result is Negative.  Fact Sheet for Patients:  EntrepreneurPulse.com.au  Fact Sheet for Healthcare Providers:  IncredibleEmployment.be  This test is no t yet approved or cleared by the [place] FDA and  has been authorized for detection and/or diagnosis of SARS-CoV-2 by FDA under an Emergency Use Authorization (EUA). This EUA will remain  in effect (meaning this test can be used) for the duration of the COVID-19 declaration under Section 564(b)(1) of the Act, 21 U.S.C.section 360bbb-3(b)(1), unless the authorization is terminated  or revoked sooner.       Influenza A by PCR NEGATIVE NEGATIVE   Influenza B by PCR NEGATIVE NEGATIVE    Comment: (NOTE) The  Xpert Xpress SARS-CoV-2/FLU/RSV plus assay is intended as an aid in the diagnosis of influenza from Nasopharyngeal swab specimens and should not be used as a  sole basis for treatment. Nasal washings and aspirates are unacceptable for Xpert Xpress SARS-CoV-2/FLU/RSV testing.  Fact Sheet for Patients: EntrepreneurPulse.com.au  Fact Sheet for Healthcare Providers: IncredibleEmployment.be  This test is not yet approved or cleared by the [place] FDA and has been authorized for detection and/or diagnosis of SARS-CoV-2 by FDA under an Emergency Use Authorization (EUA). This EUA will remain in effect (meaning this test can be used) for the duration of the COVID-19 declaration under Section 564(b)(1) of the Act, 21 U.S.C. section 360bbb-3(b)(1), unless the authorization is terminated or revoked.  Performed at [facility], [address] [place], [place] [address]   Glucose, capillary     Status: None   Collection Time: 08/29/20 11:56 PM  Result Value Ref Range   Glucose-Capillary 77 70 - 99 mg/dL    Comment: Glucose reference range applies only to samples taken after fasting for at least 8 hours.  Urine Drug Screen, Qualitative     Status: None   Collection Time: 08/30/20  3:53 AM  Result Value Ref Range   Tricyclic, Ur Screen NONE DETECTED NONE DETECTED   Amphetamines, Ur Screen NONE DETECTED NONE DETECTED   MDMA (Ecstasy)Ur Screen NONE DETECTED NONE DETECTED   Cocaine Metabolite,Ur [place] NONE DETECTED NONE DETECTED   Opiate, Ur Screen NONE DETECTED NONE DETECTED   Phencyclidine (PCP) Ur S NONE DETECTED NONE DETECTED   Cannabinoid 50 Ng, Ur [place] NONE DETECTED NONE DETECTED   Barbiturates, Ur Screen NONE DETECTED NONE DETECTED   Benzodiazepine, Ur Scrn NONE DETECTED NONE DETECTED   Methadone Scn, Ur NONE DETECTED NONE DETECTED    Comment: (NOTE) Tricyclics + metabolites, urine    Cutoff 1000 ng/mL Amphetamines + metabolites, urine  Cutoff 1000 ng/mL MDMA (Ecstasy), urine              Cutoff 500 ng/mL Cocaine Metabolite, urine          Cutoff 300 ng/mL Opiate + metabolites, urine         Cutoff 300 ng/mL Phencyclidine (PCP), urine         Cutoff 25 ng/mL Cannabinoid, urine                 Cutoff 50 ng/mL Barbiturates + metabolites, urine  Cutoff 200 ng/mL Benzodiazepine, urine              Cutoff 200 ng/mL Methadone, urine                   Cutoff 300 ng/mL  The urine drug screen provides only a preliminary, unconfirmed analytical test result and should not be used for non-medical purposes. Clinical consideration and professional judgment should be applied to any positive drug screen result due to possible interfering substances. A more specific alternate chemical method must be used in order to obtain a confirmed analytical result. Gas chromatography / mass spectrometry (GC/MS) is the preferred confirm atory method. Performed at [facility], [address] [place], [place] [address]   Glucose, capillary     Status: None   Collection Time: 08/30/20  5:56 AM  Result Value Ref Range   Glucose-Capillary 75 70 - 99 mg/dL    Comment: Glucose reference range applies only to samples taken after fasting for at least 8 hours.  Basic metabolic panel     Status: Abnormal  Collection Time: 08/30/20  6:38 AM  Result Value Ref Range   Sodium 140 135 - 145 mmol/L   Potassium 4.1 3.5 - 5.1 mmol/L   Chloride 105 98 - 111 mmol/L   CO2 27 22 - 32 mmol/L   Glucose, Bld 74 70 - 99 mg/dL    Comment: Glucose reference range applies only to samples taken after fasting for at least 8 hours.   BUN 15 8 - 23 mg/dL   Creatinine, Ser 0.66 0.61 - 1.24 mg/dL   Calcium 8.6 (L) 8.9 - 10.3 mg/dL   GFR, Estimated >60 >60 mL/min    Comment: (NOTE) Calculated using the CKD-EPI Creatinine Equation (2021)    Anion gap 8 5 - 15    Comment: Performed at [facility], [address] [place], [place] [address]  CBC     Status: Abnormal   Collection Time: 08/30/20  6:38 AM  Result Value Ref Range   WBC 6.2 4.0 - 10.5 K/uL   RBC 4.04 (L) 4.22 - 5.81 MIL/uL   Hemoglobin 11.9  (L) 13.0 - 17.0 g/dL   HCT 36.8 (L) 39.0 - 52.0 %   MCV 91.1 80.0 - 100.0 fL   MCH 29.5 26.0 - 34.0 pg   MCHC 32.3 30.0 - 36.0 g/dL   RDW 14.4 11.5 - 15.5 %   Platelets 148 (L) 150 - 400 K/uL   nRBC 0.0 0.0 - 0.2 %    Comment: Performed at [facility], [address] [place], [place] [address]  Glucose, capillary     Status: None   Collection Time: 08/30/20  8:18 AM  Result Value Ref Range   Glucose-Capillary 81 70 - 99 mg/dL    Comment: Glucose reference range applies only to samples taken after fasting for at least 8 hours.  Glucose, capillary     Status: None   Collection Time: 08/30/20  2:03 PM  Result Value Ref Range   Glucose-Capillary 70 70 - 99 mg/dL    Comment: Glucose reference range applies only to samples taken after fasting for at least 8 hours.    Current Facility-Administered Medications  Medication Dose Route Frequency Provider Last Rate Last Admin  . buPROPion (WELLBUTRIN XL) 24 hr tablet 300 mg  300 mg Oral Daily Madell Heino T, MD   300 mg at 08/29/20 1620  . citalopram (CELEXA) tablet 20 mg  20 mg Oral Daily Denessa Cavan, Madie Reno, MD   20 mg at 08/29/20 1621  . dextrose 5 %-0.9 % sodium chloride infusion   Intravenous Continuous Loletha Grayer, MD 50 mL/hr at 08/30/20 1227 New Bag at 08/30/20 1227  . dextrose 50 % solution 50 mL  50 mL Intravenous PRN Ivor Costa, MD      . Derrill Memo ON 08/31/2020] feeding supplement (ENSURE ENLIVE / ENSURE PLUS) liquid 237 mL  237 mL Oral BID BM Wieting, Richard, MD      . Derrill Memo ON 08/31/2020] fluconazole (DIFLUCAN) IVPB 100 mg  100 mg Intravenous Q24H Wieting, Richard, MD      . glycopyrrolate (ROBINUL) injection 0.1 mg  0.1 mg Intravenous TID Ivor Costa, MD   0.1 mg at 08/30/20 1454  . heparin injection 5,000 Units  5,000 Units Subcutaneous Q8H Ivor Costa, MD   5,000 Units at 08/30/20 1454  . ipratropium-albuterol (DUONEB) 0.5-2.5 (3) MG/3ML nebulizer solution 3 mL  3 mL Nebulization Q6H Wieting, Richard, MD      .  OLANZapine (ZYPREXA) tablet 5 mg  5 mg Oral  QHS Risa Auman, Madie Reno, MD   5 mg at 08/29/20 2306  . ondansetron (ZOFRAN) injection 4 mg  4 mg Intravenous Q8H PRN Ivor Costa, MD      . pantoprazole (PROTONIX) injection 40 mg  40 mg Intravenous Q12H Ivor Costa, MD   40 mg at 08/30/20 0935    Musculoskeletal: Strength & Muscle Tone: within normal limits Gait & Station: normal Patient leans: N/A            Psychiatric Specialty Exam:  Presentation  General Appearance: No data recorded Eye Contact:No data recorded Speech:No data recorded Speech Volume:No data recorded Handedness:No data recorded  Mood and Affect  Mood:No data recorded Affect:No data recorded  Thought Process  Thought Processes:No data recorded Descriptions of Associations:No data recorded Orientation:No data recorded Thought Content:No data recorded History of Schizophrenia/Schizoaffective disorder:No data recorded Duration of Psychotic Symptoms:No data recorded Hallucinations:No data recorded Ideas of Reference:No data recorded Suicidal Thoughts:No data recorded Homicidal Thoughts:No data recorded  Sensorium  Memory:No data recorded Judgment:No data recorded Insight:No data recorded  Executive Functions  Concentration:No data recorded Attention Span:No data recorded Recall:No data recorded Fund of Knowledge:No data recorded Language:No data recorded  Psychomotor Activity  Psychomotor Activity:No data recorded  Assets  Assets:No data recorded  Sleep  Sleep:No data recorded  Physical Exam: Physical Exam Vitals and nursing note reviewed.  Constitutional:      Appearance: Normal appearance.  HENT:     Head: Normocephalic and atraumatic.     Mouth/Throat:     Pharynx: Oropharynx is clear.  Eyes:     Pupils: Pupils are equal, round, and reactive to light.  Cardiovascular:     Rate and Rhythm: Normal rate and regular rhythm.  Pulmonary:     Effort: Pulmonary effort is normal.      Breath sounds: Normal breath sounds.  Abdominal:     General: Abdomen is flat.     Palpations: Abdomen is soft.  Musculoskeletal:        General: Normal range of motion.  Skin:    General: Skin is warm and dry.  Neurological:     General: No focal deficit present.     Mental Status: He is alert. Mental status is at baseline.     Comments: Unable to swallow without aspiration  Psychiatric:        Mood and Affect: Mood normal.        Speech: Speech is delayed.        Behavior: Behavior is slowed.        Thought Content: Thought content normal. Thought content is not paranoid. Thought content does not include homicidal or suicidal ideation.        Cognition and Memory: Cognition is impaired.    Review of Systems  Constitutional: Negative.   HENT: Negative.   Eyes: Negative.   Respiratory: Negative.   Cardiovascular: Negative.   Gastrointestinal: Negative.   Musculoskeletal: Negative.   Skin: Negative.   Neurological: Negative.   Psychiatric/Behavioral: Negative.    Blood pressure 115/71, pulse 93, temperature 98.9 F (37.2 C), resp. rate 16, height 5\' 9"  (1.753 m), weight 81.6 kg, SpO2 95 %. Body mass index is 26.58 kg/m.  Treatment Plan Summary: Plan Patient is saying he is not having any suicidal ideation but given his very concerning history I do not want to discontinue the sitter and suicide precautions yet.  Supportive therapy to the patient.  If it turns out he will be getting a feeding tube he will be able  to get medication through it.  Otherwise at this point seems stable enough to not need any change to medication orders.  Disposition: Supportive therapy provided about ongoing stressors.  Alethia Berthold, MD 08/30/2020 5:51 PM

## 2020-08-30 NOTE — Progress Notes (Signed)
Patient ID: GIANNY SABINO, male   DOB: 01-10-1946, 75 y.o.   MRN: [id number] [facility] PROGRESS NOTE  YITZCHAK KOTHARI GQQ:[id number] DOB: 05/03/1946 DOA: 08/29/2020 PCP: Venia Carbon, MD  HPI/Subjective: Patient states he felt like he did okay with the swallowing test today.  As per speech pathology he failed the swallow test.  The patient wishes to be a DO NOT RESUSCITATE.  He did not want a NG tube or feeding tube.  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 Came in with difficulty swallowing and also had suicidal ideation.  Objective: Vitals:   08/30/20 0818 08/30/20 1133  BP: 101/69 117/64  Pulse: 74 89  Resp: 15 15  Temp: 98 F (36.7 C) 99.1 F (37.3 C)  SpO2: 94% 92%    Intake/Output Summary (Last 24 hours) at 08/30/2020 1316 Last data filed at 08/30/2020 0349 Gross per 24 hour  Intake -  Output 825 ml  Net -825 ml   Filed Weights   08/29/20 1117  Weight: 81.6 kg    ROS: Review of Systems  Respiratory: Positive for cough and shortness of breath.   Cardiovascular: Negative for chest pain.  Gastrointestinal: Negative for abdominal pain, nausea and vomiting.   Exam: Physical Exam HENT:     Head: Normocephalic.     Mouth/Throat:     Comments: Thrush Eyes:     General: Lids are normal.     Conjunctiva/sclera: Conjunctivae normal.  Cardiovascular:     Rate and Rhythm: Normal rate and regular rhythm.     Heart sounds: Normal heart sounds, S1 normal and S2 normal.  Pulmonary:     Breath sounds: Examination of the right-lower field reveals decreased breath sounds. Examination of the left-lower field reveals decreased breath sounds. Decreased breath sounds present. No wheezing, rhonchi or rales.     Comments: Coughing while I was in the room Abdominal:     Palpations: Abdomen is soft.     Tenderness: There is no abdominal tenderness.  Musculoskeletal:     Right lower leg: No swelling.     Left lower leg: No swelling.  Skin:    General: Skin is  warm.     Findings: No rash.  Neurological:     Mental Status: He is alert and oriented to person, place, and time.       Data Reviewed: Basic Metabolic Panel: Recent Labs  Lab 08/29/20 1132 08/30/20 0638  NA 137 140  K 4.6 4.1  CL 103 105  CO2 26 27  GLUCOSE 102* 74  BUN 18 15  CREATININE 0.60* 0.66  CALCIUM 8.9 8.6*   Liver Function Tests: Recent Labs  Lab 08/29/20 1132  AST 265*  ALT 219*  ALKPHOS 83  BILITOT 0.7  PROT 7.0  ALBUMIN 3.8   CBC: Recent Labs  Lab 08/29/20 1132 08/30/20 0638  WBC 7.3 6.2  HGB 14.1 11.9*  HCT 43.1 36.8*  MCV 89.2 91.1  PLT 202 148*    CBG: Recent Labs  Lab 08/29/20 2356 08/30/20 0556 08/30/20 0818  GLUCAP 77 75 81    Recent Results (from the past 240 hour(s))  Resp Panel by RT-PCR (Flu A&B, Covid) Nasopharyngeal Swab     Status: None   Collection Time: 08/29/20 12:07 PM   Specimen: Nasopharyngeal Swab; Nasopharyngeal(NP) swabs in vial transport medium  Result Value Ref Range Status   SARS Coronavirus 2 by RT PCR NEGATIVE NEGATIVE Final    Comment: (NOTE) SARS-CoV-2 target nucleic acids  are NOT DETECTED.  The SARS-CoV-2 RNA is generally detectable in upper respiratory specimens during the acute phase of infection. The lowest concentration of SARS-CoV-2 viral copies this assay can detect is 138 copies/mL. A negative result does not preclude SARS-Cov-2 infection and should not be used as the sole basis for treatment or other patient management decisions. A negative result may occur with  improper specimen collection/handling, submission of specimen other than nasopharyngeal swab, presence of viral mutation(s) within the areas targeted by this assay, and inadequate number of viral copies(<138 copies/mL). A negative result must be combined with clinical observations, patient history, and epidemiological information. The expected result is Negative.  Fact Sheet for Patients:   EntrepreneurPulse.com.au  Fact Sheet for Healthcare Providers:  IncredibleEmployment.be  This test is no t yet approved or cleared by the [place] FDA and  has been authorized for detection and/or diagnosis of SARS-CoV-2 by FDA under an Emergency Use Authorization (EUA). This EUA will remain  in effect (meaning this test can be used) for the duration of the COVID-19 declaration under Section 564(b)(1) of the Act, 21 U.S.C.section 360bbb-3(b)(1), unless the authorization is terminated  or revoked sooner.       Influenza A by PCR NEGATIVE NEGATIVE Final   Influenza B by PCR NEGATIVE NEGATIVE Final    Comment: (NOTE) The Xpert Xpress SARS-CoV-2/FLU/RSV plus assay is intended as an aid in the diagnosis of influenza from Nasopharyngeal swab specimens and should not be used as a sole basis for treatment. Nasal washings and aspirates are unacceptable for Xpert Xpress SARS-CoV-2/FLU/RSV testing.  Fact Sheet for Patients: EntrepreneurPulse.com.au  Fact Sheet for Healthcare Providers: IncredibleEmployment.be  This test is not yet approved or cleared by the [place] FDA and has been authorized for detection and/or diagnosis of SARS-CoV-2 by FDA under an Emergency Use Authorization (EUA). This EUA will remain in effect (meaning this test can be used) for the duration of the COVID-19 declaration under Section 564(b)(1) of the Act, 21 U.S.C. section 360bbb-3(b)(1), unless the authorization is terminated or revoked.  Performed at [facility], [address], [place], [place] [address]      Studies: [facility] [place] 1 View  Result Date: 08/30/2020 CLINICAL DATA:  Cough. EXAM: PORTABLE CHEST 1 VIEW COMPARISON:  Chest x-ray dated July 22, 2018 FINDINGS: Normal heart size. Normal pulmonary vascularity. No focal consolidation, pleural effusion, or pneumothorax. Chronic blunting of the right  costophrenic angle. No acute osseous abnormality. Unchanged mild elevation of the right hemidiaphragm. IMPRESSION: 1. No active disease. Electronically Signed   By: Titus Dubin M.D.   On: 08/30/2020 10:55   DG Swallowing Func-Speech Pathology  Result Date: 08/30/2020 Objective Swallowing Evaluation: Type of Study: MBS-Modified Barium Swallow Study  Patient Details Name: AKIO HUDNALL MRN: [id number] Date of Birth: Sep 04, 1945 Today's Date: 08/30/2020 Time: SLP Start Time (ACUTE ONLY): 0745 -SLP Stop Time (ACUTE ONLY): 0815 SLP Time Calculation (min) (ACUTE ONLY): 30 min Past Medical History: Past Medical History: Diagnosis Date . BPH (benign prostatic hypertrophy)  . Depression 1980's03-30-04  MDD---even needed hospitalization . 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 Kidney stone 1980 only . Osteoarthritis of both knees  . Stroke ([place])  . Sudden cardiac death [facility]) 14-Sep-1999  no clear etiology of this Past Surgical History: Past Surgical History: Procedure Laterality Date . CARDIAC CATHETERIZATION  last in 09-14-07  insignificant coronary disease . CARDIOVASCULAR STRESS TEST  07/09/11  Negative--Dr Paraschos . HERNIA REPAIR  2012  left inguinal--Dr Byrnett . ROTATOR CUFF REPAIR  2009  both done by Dr Margaretmary Eddy . TOTAL KNEE ARTHROPLASTY Right 2/14  Dr Marry Guan . TOTAL KNEE ARTHROPLASTY Left 1/15  Dr Sebastian Ache HPI: ETHANAEL VEITH is a 75 y.o. male with medical history significant of HLD, stroke with left-sided weakness and ICH, dysphagia, GERD, depression, anxiety, BPH, kidney stone, vertigo, who presented to Outpatient ST for dysphagia management on 08/30/2020. Pt presented with worsening dysphagia and report of choking on food and needing the himlich. During the Outpatient session, pt also expressed some suicidal ideation. Pt admited thru ED for suicidal ideation and dysphagia.  Subjective: pt pleasant, wet vocal quality, knows this therapist Assessment / Plan /  Recommendation CHL IP CLINICAL IMPRESSIONS 08/30/2020 Clinical Impression Pt presents with worsening oropharyngeal dysphagia that results in gross aspiration of bolus. While pt's oral phase is mildly impaired, his pharyngeal phase impairments are profound. Pt's oral phase is c/b mild oral holding and premature spillage to the vallecula. Pt's profound pharyngeal phase deficits are c/b swallow initiation at the pyriform sinsus but his severely diminished hyolaryngeal movement is more concerning as his lack of hyolaryngeal movement fails to open his UES. This results in gross aspiration of bolus during the swallow. While pt did have a cough response, his cough was ineffective in clearing aspirate and he became asphyiated. Pt was able to recover without intervention but his voice remained very wet and he continued to have pharyngeal residue throughout pharynx as well as laryngeal vestibule. At this time, pt is unsafe to consume any POs.  Prognosis for improvement is considered poor in light of pt's rapid decline since previous MBSS in Feb. 2022. At this time, recommend consult to Palliative Care to determine [place]. If a PEG aligns with pt's GOC, it is important to consider that a PEG will not prevent aspiration of pt's salvia. At baseline, pt wife reports, "he gets choked on his spit, he gurgles all the time, sometimes he can't breath because he is choking on his own spit." SLP Visit Diagnosis Dysphagia, pharyngoesophageal phase (R13.14) Attention and concentration deficit following -- Frontal lobe and executive function deficit following -- Impact on safety and function Severe aspiration risk;Risk for inadequate nutrition/hydration   CHL IP TREATMENT RECOMMENDATION 08/30/2020 Treatment Recommendations No treatment recommended at this time   Prognosis 08/30/2020 Prognosis for Safe Diet Advancement (No Data) Barriers to Reach Goals Severity of deficits Barriers/Prognosis Comment -- CHL IP DIET RECOMMENDATION 08/30/2020 SLP  Diet Recommendations NPO Liquid Administration via -- Medication Administration Via alternative means Compensations -- Postural Changes --   CHL IP OTHER RECOMMENDATIONS 08/30/2020 Recommended Consults -- Oral Care Recommendations Oral care QID Other Recommendations Have oral suction available   CHL IP FOLLOW UP RECOMMENDATIONS 08/30/2020 Follow up Recommendations (No Data)   No flowsheet data found.     CHL IP ORAL PHASE 08/30/2020 Oral Phase Impaired Oral - Pudding Teaspoon -- Oral - Pudding Cup -- Oral - Honey Teaspoon -- Oral - Honey Cup -- Oral - Nectar Teaspoon -- Oral - Nectar Cup -- Oral - Nectar Straw -- Oral - Thin Teaspoon Weak lingual manipulation;Lingual pumping;Reduced posterior propulsion;Holding of bolus;Delayed oral transit;Decreased bolus cohesion;Premature spillage Oral - Thin Cup -- Oral - Thin Straw -- Oral - Puree -- Oral - Mech Soft -- Oral - Regular -- Oral - Multi-Consistency -- Oral - Pill -- Oral Phase - Comment --  CHL IP PHARYNGEAL PHASE 08/30/2020 Pharyngeal Phase Impaired Pharyngeal- Pudding Teaspoon -- Pharyngeal -- Pharyngeal- Pudding  Cup -- Pharyngeal -- Pharyngeal- Honey Teaspoon -- Pharyngeal -- Pharyngeal- Honey Cup -- Pharyngeal -- Pharyngeal- Nectar Teaspoon -- Pharyngeal -- Pharyngeal- Nectar Cup -- Pharyngeal -- Pharyngeal- Nectar Straw -- Pharyngeal -- Pharyngeal- Thin Teaspoon Delayed swallow initiation-pyriform sinuses;Reduced anterior laryngeal mobility;Reduced laryngeal elevation;Reduced tongue base retraction;Penetration/Aspiration during swallow;Significant aspiration (Amount);Pharyngeal residue - pyriform;Pharyngeal residue - valleculae;Pharyngeal residue - posterior pharnyx Pharyngeal Material enters airway, passes BELOW cords and not ejected out despite cough attempt by patient Pharyngeal- Thin Cup -- Pharyngeal -- Pharyngeal- Thin Straw -- Pharyngeal -- Pharyngeal- Puree -- Pharyngeal -- Pharyngeal- Mechanical Soft -- Pharyngeal -- Pharyngeal- Regular -- Pharyngeal --  Pharyngeal- Multi-consistency -- Pharyngeal -- Pharyngeal- Pill -- Pharyngeal -- Pharyngeal Comment pt became asphyxiated  CHL IP CERVICAL ESOPHAGEAL PHASE 08/02/2020 Cervical Esophageal Phase Impaired Pudding Teaspoon -- Pudding Cup -- Honey Teaspoon -- Honey Cup -- Nectar Teaspoon Reduced cricopharyngeal relaxation Nectar Cup Reduced cricopharyngeal relaxation Nectar Straw -- Thin Teaspoon Reduced cricopharyngeal relaxation Thin Cup Reduced cricopharyngeal relaxation Thin Straw -- Puree Reduced cricopharyngeal relaxation Mechanical Soft -- Regular -- Multi-consistency -- Pill -- Cervical Esophageal Comment -- Happi B. Rutherford Nail M.S., CCC-SLP, [place] Office [phone] [name] 08/30/2020, 9:39 AM               Scheduled Meds: . buPROPion  300 mg Oral Daily  . citalopram  20 mg Oral Daily  . glycopyrrolate  0.1 mg Intravenous TID  . heparin  5,000 Units Subcutaneous Q8H  . ipratropium-albuterol  3 mL Nebulization Q6H  . OLANZapine  5 mg Oral QHS  . pantoprazole (PROTONIX) IV  40 mg Intravenous Q12H   Continuous Infusions: . dextrose 5 % and 0.9% NaCl 50 mL/hr at 08/30/20 1227  . [START ON 08/31/2020] fluconazole (DIFLUCAN) IV    . fluconazole (DIFLUCAN) IV 200 mg (08/30/20 1238)    Assessment/Plan:  1. Dysphagia.  Patient failed modified barium swallow test today.  Patient does not want a feeding tube or NG tube.  We will get palliative care consultation.  Likely a candidate for hospice home. Patient made a DNR and confirmed with the patient's wife on the phone.  We will start IV fluids. 2. Ritta Slot we will start IV Diflucan 3. Suicidal ideation with depression anxiety.  Psychiatry has on IVC.  Patient ordered Wellbutrin and Celexa and olanzapine but likely will be unable to take with being n.p.o. 4. Paralysis of the vocal cord. 5. Elevated liver function test.  Recheck tomorrow 6. Hyperlipidemia unspecified.  Hold cholesterol with elevated  liver function test 7. History of stroke holding aspirin and Crestor        Code Status:     Code Status Orders  (From admission, onward)         Start     Ordered   08/30/20 1026  Do not attempt resuscitation (DNR)  Continuous       Question Answer Comment  In the event of cardiac or respiratory ARREST Do not call a "code blue"   In the event of cardiac or respiratory ARREST Do not perform Intubation, CPR, defibrillation or ACLS   In the event of cardiac or respiratory ARREST Use medication by any route, position, wound care, and other measures to relive pain and suffering. May use oxygen, suction and manual treatment of airway obstruction as needed for comfort.   Comments nurse may pronounce      08/30/20 1026        Code Status History    Date Active Date  Inactive Code Status Order ID Comments User Context   08/29/2020 2143 08/30/2020 1026 Full Code [id number]  Ivor Costa, MD Inpatient   08/29/2020 1334 08/29/2020 2143 DNR [id number]  Ivor Costa, MD ED   08/29/2020 1153 08/29/2020 1334 Full Code [id number]  Lucrezia Starch, MD ED   03/02/2019 0209 03/03/2019 1311 Full Code [id number]  Mansy, Arvella Merles, MD ED   Advance Care Planning Activity    Advance Directive Documentation   Flowsheet Row Most Recent Value  Type of Advance Directive Living will  Pre-existing out of facility DNR order (yellow form or pink MOST form) -  "MOST" Form in Place? -     Family Communication: Spoke with wife on the phone Disposition Plan: Status is: Inpatient  Dispo: The patient is from: Home              Anticipated d/c is to: Potentially a candidate for hospice home              Patient currently failed swallow evaluation.  Patient does not want an NG tube or feeding tube.   Difficult to place patient.  Hopefully not.  Consultants:  Psychiatry  Palliative care  Speech therapy  Time spent: 28 minutes  [place]

## 2020-08-31 DIAGNOSIS — R748 Abnormal levels of other serum enzymes: Secondary | ICD-10-CM

## 2020-08-31 DIAGNOSIS — E785 Hyperlipidemia, unspecified: Secondary | ICD-10-CM

## 2020-08-31 LAB — COMPREHENSIVE METABOLIC PANEL
ALT: 179 U/L — ABNORMAL HIGH (ref 0–44)
AST: 196 U/L — ABNORMAL HIGH (ref 15–41)
Albumin: 3.5 g/dL (ref 3.5–5.0)
Alkaline Phosphatase: 79 U/L (ref 38–126)
Anion gap: 9 (ref 5–15)
BUN: 18 mg/dL (ref 8–23)
CO2: 25 mmol/L (ref 22–32)
Calcium: 8.7 mg/dL — ABNORMAL LOW (ref 8.9–10.3)
Chloride: 106 mmol/L (ref 98–111)
Creatinine, Ser: 0.82 mg/dL (ref 0.61–1.24)
GFR, Estimated: >60 mL/min (ref >60)
Glucose, Bld: 132 mg/dL — ABNORMAL HIGH (ref 70–99)
Potassium: 3.7 mmol/L (ref 3.5–5.1)
Sodium: 140 mmol/L (ref 135–145)
Total Bilirubin: 1.2 mg/dL (ref 0.3–1.2)
Total Protein: 6.5 g/dL (ref 6.5–8.1)

## 2020-08-31 LAB — GLUCOSE, CAPILLARY
Glucose-Capillary: 83 mg/dL (ref 70–99)
Glucose-Capillary: 114 mg/dL — ABNORMAL HIGH (ref 70–99)

## 2020-08-31 MED ORDER — SCOPOLAMINE 1 MG/3DAYS TD PT72
1.0000 | MEDICATED_PATCH | TRANSDERMAL | Status: DC
  Administered 2020-08-31: 22:00:00 1.5 mg via TRANSDERMAL
  Filled 2020-08-31 (×2): qty 1

## 2020-08-31 MED ORDER — SCOPOLAMINE 1 MG/3DAYS TD PT72: 1.0000 | MEDICATED_PATCH | TRANSDERMAL | Status: DC

## 2020-08-31 MED ORDER — MORPHINE SULFATE (PF) 2 MG/ML IV SOLN
1.0000 mg | INTRAVENOUS | Status: DC | PRN
  Administered 2020-09-01: 2 mg via INTRAVENOUS
  Filled 2020-08-31 (×2): qty 1

## 2020-08-31 MED ORDER — LORAZEPAM 2 MG/ML IJ SOLN
0.5000 mg | INTRAMUSCULAR | Status: DC | PRN
  Administered 2020-08-31: 17:00:00 1 mg via INTRAVENOUS
  Filled 2020-08-31: qty 1

## 2020-08-31 MED ORDER — LORAZEPAM 2 MG/ML IJ SOLN: 0.5000 mg | INTRAMUSCULAR | Status: DC | PRN

## 2020-08-31 NOTE — Progress Notes (Addendum)
[facility]) Hospital Liaison RN note:  Received request from Asencion Gowda, NP for family interest in [place]. Chart reviewed and eligibility has been approved. Spoke with patient and spouse, June in the room to confirm interests and explain services. June will complete the registration paperwork once approved. [facility] Liaison will follow for approval and bed availability.  Please call with any hospice related questions or concerns.  Thank you for the opportunity to participate in this patient's care.  Zandra Abts, RN [facility] Liaison [phone]

## 2020-08-31 NOTE — Progress Notes (Addendum)
Daily Progress Note   Patient Name: Keith Hall       Date: 08/31/2020 DOB: 19-Mar-1946  Age: 75 y.o. MRN#: [id number] Attending Physician: Loletha Grayer, MD Primary Care Physician: Venia Carbon, MD Admit Date: 08/29/2020  Reason for Consultation/Follow-up: Establishing goals of care  Subjective: Patient is resting in bed with younker catheter in hand. He frequently suctions secretions placing it far back into his mouth, and not eliciting a gag reflex or cough. He does make himself cough trying to clear the secretions but does not feel any urge to do so. He sounds congested from across the room. Wife and son are at bedside.   He discusses his paralysis from the stroke, then the weakness on the other half of his body from an unknown cause. He discusses his vocal cord paralysis, and the inability to speak, or eat and drink. He discusses how his body is a burden to him and to his family as his wife is unable to care for him. He states he has no QOL, and no independence. He states he does not want to live this way. He states he understands the complexity of his issues, and that they will not improve to an acceptable QOL,and will lead to further hospitalizations in the future. He articulates his status accurately.    We discussed his diagnosis, prognosis, GOC, EOL wishes disposition and options.  Created space and opportunity for patient  to explore thoughts and feelings regarding current medical information.   A detailed discussion was had today regarding advanced directives.  Concepts specific to code status, artifical feeding and hydration, IV antibiotics and rehospitalization were discussed.  The difference between an aggressive medical intervention path and a comfort care path was  discussed.  Values and goals of care important to patient and family were attempted to be elicited.  Discussed limitations of medical interventions to prolong quality of life in some situations and discussed the concept of human mortality.  He states he did not want hospice previously because he knew he would not improve and did not want to suffer while he waited to die, he wanted to die on his terms. Given his difficulty with swallowing, he would like to go to the hospice facility as he does not want to be a burden on his family, and would like  symptom management for comfort.  He requests a foley cath due to retention. Nursing confirms in and out catheterizations for the past 24 hours. Foley ordered.   Patient is currently under IVC. Messaged primary MD Dr. Leslye Peer in epic chat to let him know patient and family desire.  He included Dr. Weber Cooks with psychiatry regarding discontinuing IVC and going to hospice facility. Per epic chat message, psychiatry will discontinue IVC.    I completed a MOST form today with patient and family, and the signed original was placed in the chart. A photocopy was placed in the chart to be scanned into EMR. The patient outlined their wishes for the following treatment decisions:  Cardiopulmonary Resuscitation: Do Not Attempt Resuscitation (DNR/No CPR)  Medical Interventions: Comfort Measures: Keep clean, warm, and dry. Use medication by any route, positioning, wound care, and other measures to relieve pain and suffering. Use oxygen, suction and manual treatment of airway obstruction as needed for comfort. Do not transfer to the hospital unless comfort needs cannot be met in current location.  Antibiotics: No antibiotics (use other measures to relieve symptoms)  IV Fluids: No IV fluids (provide other measures to ensure comfort)  Feeding Tube: No feeding tube     Length of Stay: 1  Current Medications: Scheduled Meds:  . buPROPion  300 mg Oral Daily  . citalopram   20 mg Oral Daily  . feeding supplement  237 mL Oral BID BM  . glycopyrrolate  0.1 mg Intravenous TID  . heparin  5,000 Units Subcutaneous Q8H  . ipratropium-albuterol  3 mL Nebulization Q6H  . OLANZapine  5 mg Oral QHS  . pantoprazole (PROTONIX) IV  40 mg Intravenous Q12H    Continuous Infusions: . dextrose 5 % and 0.9% NaCl 50 mL/hr at 08/30/20 1816  . fluconazole (DIFLUCAN) IV      PRN Meds: dextrose, ondansetron (ZOFRAN) IV  Physical Exam Pulmonary:     Comments: Congestion Neurological:     Mental Status: He is alert.             Vital Signs: BP 125/61 (BP Location: Left Arm)   Pulse 79   Temp 98.1 F (36.7 C) (Oral)   Resp 17   Ht _0  (1.753 m)   Wt 81.6 kg   SpO2 94%   BMI 26.58 kg/m  SpO2: SpO2: 94 % O2 Device: O2 Device: Room Air O2 Flow Rate:    Intake/output summary:   Intake/Output Summary (Last 24 hours) at 08/31/2020 1443 Last data filed at 08/31/2020 0640 Gross per 24 hour  Intake 150.53 ml  Output 1250 ml  Net -1099.47 ml   LBM: Last BM Date: 08/30/20 Baseline Weight: Weight: 81.6 kg Most recent weight: Weight: 81.6 kg        Patient Active Problem List   Diagnosis Date Noted  . Thrush   . Vocal cord paralysis   . Abnormal LFTs 08/29/2020  . HLD (hyperlipidemia) 08/29/2020  . Stroke ([place]) 08/29/2020  . GERD (gastroesophageal reflux disease) 08/29/2020  . Depression with anxiety 08/29/2020  . Suicidal ideation 08/29/2020  . Dysphagia 08/29/2020  . Severe recurrent major depression without psychotic features ([place]) 08/29/2020  . Pharyngeal dysphagia 07/18/2020  . 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 Sleep disturbance 03/12/2019  . Vertigo 03/02/2019  . Low back pain 03/13/2016  . 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 Fatigue 08/23/2014  .  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 Advanced directives, counseling/discussion 02/07/2014  . Routine general medical examination at a health care facility 01/18/2013  . Osteoarthritis of both knees   . MDD (major depressive disorder), recurrent episode, moderate ([place])   . BPH (benign prostatic hyperplasia)     Palliative Care Assessment & Plan   Recommendations/Plan:  Shift to comfort care.   Recommend hospice facility as patient will require symptom managment.   Maintain foley- placed for retention.     Code Status:    Code Status Orders  (From admission, onward)         Start     Ordered   08/30/20 1026  Do not attempt resuscitation (DNR)  Continuous       Question Answer Comment  In the event of cardiac or respiratory ARREST Do not call a "code blue"   In the event of cardiac or respiratory ARREST Do not perform Intubation, CPR, defibrillation or ACLS   In the event of cardiac or respiratory ARREST Use medication by any route, position, wound care, and other measures to relive pain and suffering. May use oxygen, suction and manual treatment of airway obstruction as needed for comfort.   Comments nurse may pronounce      08/30/20 1026        Code Status History    Date Active Date Inactive Code Status Order ID Comments User Context   08/29/2020 2143 08/30/2020 1026 Full Code [id number]  Ivor Costa, MD Inpatient   08/29/2020 1334 08/29/2020 2143 DNR [id number]  Ivor Costa, MD ED   08/29/2020 1153 08/29/2020 1334 Full Code [id number]  Lucrezia Starch, MD ED   03/02/2019 0209 03/03/2019 1311 Full Code [id number]  Mansy, Arvella Merles, MD ED   Advance Care Planning Activity    Advance Directive Documentation   Flowsheet Row Most Recent Value  Type of Advance Directive Living will  Pre-existing out of facility DNR order (yellow form or pink MOST form) -  "MOST" Form in Place? -       Prognosis:   < 2 weeks    Care plan was discussed with primary MD, psychiatry added to chat by primary MD. Plans to drop his  involuntary status.   Thank you for allowing the Palliative Medicine Team to assist in the care of this patient.   Time In: 2:00 Time Out: 2:50 Total Time 50 min Prolonged Time Billed no      Greater than 50%  of this time was spent counseling and coordinating care related to the above assessment and plan.  Asencion Gowda, NP  Please contact Palliative Medicine Team phone at [phone] for questions and concerns.

## 2020-08-31 NOTE — Progress Notes (Addendum)
Patient ID: Keith Hall, male   DOB: [birth year], 75 y.o.   MRN: [id number] [facility] PROGRESS NOTE  Keith Hall QQI:[id number] DOB: 24-Jun-1945 DOA: 08/29/2020 PCP: Venia Carbon, MD  HPI/Subjective: Patient still with some cough and upper airway congestion.  Still having trouble swallowing.  Has questions about feeding tube.  Objective: Vitals:   08/31/20 0441 08/31/20 0831  BP: 124/79 125/61  Pulse: 80 79  Resp: 18 17  Temp: (!) 97.5 F (36.4 C) 98.1 F (36.7 C)  SpO2: 95% 94%    Intake/Output Summary (Last 24 hours) at 08/31/2020 1629 Last data filed at 08/31/2020 0640 Gross per 24 hour  Intake --  Output 1250 ml  Net -1250 ml   Filed Weights   08/29/20 1117  Weight: 81.6 kg    ROS: Review of Systems  Respiratory: Positive for cough and shortness of breath.   Cardiovascular: Negative for chest pain.  Gastrointestinal: Negative for abdominal pain, nausea and vomiting.   Exam: Physical Exam HENT:     Head: Normocephalic.     Mouth/Throat:     Comments: Dry mouth Eyes:     General: Lids are normal.     Conjunctiva/sclera: Conjunctivae normal.  Cardiovascular:     Rate and Rhythm: Normal rate and regular rhythm.     Heart sounds: Normal heart sounds, S1 normal and S2 normal.  Pulmonary:     Breath sounds: Transmitted upper airway sounds present. Examination of the right-lower field reveals decreased breath sounds. Examination of the left-lower field reveals decreased breath sounds. Decreased breath sounds present. No wheezing, rhonchi or rales.  Abdominal:     Palpations: Abdomen is soft.     Tenderness: There is no abdominal tenderness.  Musculoskeletal:     Right lower leg: No swelling.     Left lower leg: No swelling.  Skin:    General: Skin is warm.     Findings: No rash.  Neurological:     Mental Status: He is alert and oriented to person, place, and time.       Data Reviewed: Basic Metabolic Panel: Recent Labs  Lab  08/29/20 1132 08/30/20 0638 08/31/20 0410  NA 137 140 140  K 4.6 4.1 3.7  CL 103 105 106  CO2 26 27 25   GLUCOSE 102* 74 132*  BUN 18 15 18   CREATININE 0.60* 0.66 0.82  CALCIUM 8.9 8.6* 8.7*   Liver Function Tests: Recent Labs  Lab 08/29/20 1132 08/31/20 0410  AST 265* 196*  ALT 219* 179*  ALKPHOS 83 79  BILITOT 0.7 1.2  PROT 7.0 6.5  ALBUMIN 3.8 3.5   CBC: Recent Labs  Lab 08/29/20 1132 08/30/20 0638  WBC 7.3 6.2  HGB 14.1 11.9*  HCT 43.1 36.8*  MCV 89.2 91.1  PLT 202 148*    CBG: Recent Labs  Lab 08/30/20 0556 08/30/20 0818 08/30/20 1403 08/30/20 1818 08/31/20 0938  GLUCAP 75 81 70 83 114*    Recent Results (from the past 240 hour(s))  Resp Panel by RT-PCR (Flu A&B, Covid) Nasopharyngeal Swab     Status: None   Collection Time: 08/29/20 12:07 PM   Specimen: Nasopharyngeal Swab; Nasopharyngeal(NP) swabs in vial transport medium  Result Value Ref Range Status   SARS Coronavirus 2 by RT PCR NEGATIVE NEGATIVE Final    Comment: (NOTE) SARS-CoV-2 target nucleic acids are NOT DETECTED.  The SARS-CoV-2 RNA is generally detectable in upper respiratory specimens during the acute phase of infection. The lowest concentration of SARS-CoV-2  viral copies this assay can detect is 138 copies/mL. A negative result does not preclude SARS-Cov-2 infection and should not be used as the sole basis for treatment or other patient management decisions. A negative result may occur with  improper specimen collection/handling, submission of specimen other than nasopharyngeal swab, presence of viral mutation(s) within the areas targeted by this assay, and inadequate number of viral copies(<138 copies/mL). A negative result must be combined with clinical observations, patient history, and epidemiological information. The expected result is Negative.  Fact Sheet for Patients:  EntrepreneurPulse.com.au  Fact Sheet for Healthcare Providers:   IncredibleEmployment.be  This test is no t yet approved or cleared by the [place] FDA and  has been authorized for detection and/or diagnosis of SARS-CoV-2 by FDA under an Emergency Use Authorization (EUA). This EUA will remain  in effect (meaning this test can be used) for the duration of the COVID-19 declaration under Section 564(b)(1) of the Act, 21 U.S.C.section 360bbb-3(b)(1), unless the authorization is terminated  or revoked sooner.       Influenza A by PCR NEGATIVE NEGATIVE Final   Influenza B by PCR NEGATIVE NEGATIVE Final    Comment: (NOTE) The Xpert Xpress SARS-CoV-2/FLU/RSV plus assay is intended as an aid in the diagnosis of influenza from Nasopharyngeal swab specimens and should not be used as a sole basis for treatment. Nasal washings and aspirates are unacceptable for Xpert Xpress SARS-CoV-2/FLU/RSV testing.  Fact Sheet for Patients: EntrepreneurPulse.com.au  Fact Sheet for Healthcare Providers: IncredibleEmployment.be  This test is not yet approved or cleared by the [place] FDA and has been authorized for detection and/or diagnosis of SARS-CoV-2 by FDA under an Emergency Use Authorization (EUA). This EUA will remain in effect (meaning this test can be used) for the duration of the COVID-19 declaration under Section 564(b)(1) of the Act, 21 U.S.C. section 360bbb-3(b)(1), unless the authorization is terminated or revoked.  Performed at [facility], [address], [place], [place] [address]      Studies: [facility] Chest [place] 1 View  Result Date: 08/30/2020 CLINICAL DATA:  Cough. EXAM: PORTABLE CHEST 1 VIEW COMPARISON:  Chest x-ray dated July 22, 2018 FINDINGS: Normal heart size. Normal pulmonary vascularity. No focal consolidation, pleural effusion, or pneumothorax. Chronic blunting of the right costophrenic angle. No acute osseous abnormality. Unchanged mild elevation of the right  hemidiaphragm. IMPRESSION: 1. No active disease. Electronically Signed   By: Titus Dubin M.D.   On: 08/30/2020 10:55   DG Swallowing Func-Speech Pathology  Result Date: 08/30/2020 Objective Swallowing Evaluation: Type of Study: MBS-Modified Barium Swallow Study  Patient Details Name: Keith Hall MRN: [id number] Date of Birth: Dec 28, 1945 Today's Date: 08/30/2020 Time: SLP Start Time (ACUTE ONLY): 0745 -SLP Stop Time (ACUTE ONLY): 0815 SLP Time Calculation (min) (ACUTE ONLY): 30 min Past Medical History: Past Medical History: Diagnosis Date . BPH (benign prostatic hypertrophy)  . Depression 1980's2004/04/12  MDD---even needed hospitalization . 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 Kidney stone 1980 only . Osteoarthritis of both knees  . Stroke ([place])  . Sudden cardiac death [facility]) 09/27/1999  no clear etiology of this Past Surgical History: Past Surgical History: Procedure Laterality Date . CARDIAC CATHETERIZATION  last in 2007-09-27  insignificant coronary disease . CARDIOVASCULAR STRESS TEST  07/09/11  Negative--Dr Paraschos . HERNIA REPAIR  09/27/2010  left inguinal--Dr Byrnett . ROTATOR CUFF REPAIR  09/27/07  both done by Dr Margaretmary Eddy . TOTAL KNEE ARTHROPLASTY Right 2/14  Dr Marry Guan . TOTAL KNEE ARTHROPLASTY Left 1/15  Dr Sebastian Ache HPI: RAHMAN FERRALL is a 75 y.o. male with medical history significant of HLD, stroke with left-sided weakness and ICH, dysphagia, GERD, depression, anxiety, BPH, kidney stone, vertigo, who presented to Outpatient ST for dysphagia management on 08/30/2020. Pt presented with worsening dysphagia and report of choking on food and needing the himlich. During the Outpatient session, pt also expressed some suicidal ideation. Pt admited thru ED for suicidal ideation and dysphagia.  Subjective: pt pleasant, wet vocal quality, knows this therapist Assessment / Plan / Recommendation CHL IP CLINICAL IMPRESSIONS 08/30/2020 Clinical Impression Pt presents with  worsening oropharyngeal dysphagia that results in gross aspiration of bolus. While pt's oral phase is mildly impaired, his pharyngeal phase impairments are profound. Pt's oral phase is c/b mild oral holding and premature spillage to the vallecula. Pt's profound pharyngeal phase deficits are c/b swallow initiation at the pyriform sinsus but his severely diminished hyolaryngeal movement is more concerning as his lack of hyolaryngeal movement fails to open his UES. This results in gross aspiration of bolus during the swallow. While pt did have a cough response, his cough was ineffective in clearing aspirate and he became asphyiated. Pt was able to recover without intervention but his voice remained very wet and he continued to have pharyngeal residue throughout pharynx as well as laryngeal vestibule. At this time, pt is unsafe to consume any POs.  Prognosis for improvement is considered poor in light of pt's rapid decline since previous MBSS in Feb. 2022. At this time, recommend consult to Palliative Care to determine [place]. If a PEG aligns with pt's GOC, it is important to consider that a PEG will not prevent aspiration of pt's salvia. At baseline, pt wife reports, "he gets choked on his spit, he gurgles all the time, sometimes he can't breath because he is choking on his own spit." SLP Visit Diagnosis Dysphagia, pharyngoesophageal phase (R13.14) Attention and concentration deficit following -- Frontal lobe and executive function deficit following -- Impact on safety and function Severe aspiration risk;Risk for inadequate nutrition/hydration   CHL IP TREATMENT RECOMMENDATION 08/30/2020 Treatment Recommendations No treatment recommended at this time   Prognosis 08/30/2020 Prognosis for Safe Diet Advancement (No Data) Barriers to Reach Goals Severity of deficits Barriers/Prognosis Comment -- CHL IP DIET RECOMMENDATION 08/30/2020 SLP Diet Recommendations NPO Liquid Administration via -- Medication Administration Via  alternative means Compensations -- Postural Changes --   CHL IP OTHER RECOMMENDATIONS 08/30/2020 Recommended Consults -- Oral Care Recommendations Oral care QID Other Recommendations Have oral suction available   CHL IP FOLLOW UP RECOMMENDATIONS 08/30/2020 Follow up Recommendations (No Data)   No flowsheet data found.     CHL IP ORAL PHASE 08/30/2020 Oral Phase Impaired Oral - Pudding Teaspoon -- Oral - Pudding Cup -- Oral - Honey Teaspoon -- Oral - Honey Cup -- Oral - Nectar Teaspoon -- Oral - Nectar Cup -- Oral - Nectar Straw -- Oral - Thin Teaspoon Weak lingual manipulation;Lingual pumping;Reduced posterior propulsion;Holding of bolus;Delayed oral transit;Decreased bolus cohesion;Premature spillage Oral - Thin Cup -- Oral - Thin Straw -- Oral - Puree -- Oral - Mech Soft -- Oral - Regular -- Oral - Multi-Consistency -- Oral - Pill -- Oral Phase - Comment --  CHL IP PHARYNGEAL PHASE 08/30/2020 Pharyngeal Phase Impaired Pharyngeal- Pudding Teaspoon -- Pharyngeal -- Pharyngeal- Pudding Cup -- Pharyngeal -- Pharyngeal- Honey Teaspoon -- Pharyngeal -- Pharyngeal- Honey Cup -- Pharyngeal -- Pharyngeal- Nectar Teaspoon -- Pharyngeal -- Pharyngeal- Nectar Cup --  Pharyngeal -- Pharyngeal- Nectar Straw -- Pharyngeal -- Pharyngeal- Thin Teaspoon Delayed swallow initiation-pyriform sinuses;Reduced anterior laryngeal mobility;Reduced laryngeal elevation;Reduced tongue base retraction;Penetration/Aspiration during swallow;Significant aspiration (Amount);Pharyngeal residue - pyriform;Pharyngeal residue - valleculae;Pharyngeal residue - posterior pharnyx Pharyngeal Material enters airway, passes BELOW cords and not ejected out despite cough attempt by patient Pharyngeal- Thin Cup -- Pharyngeal -- Pharyngeal- Thin Straw -- Pharyngeal -- Pharyngeal- Puree -- Pharyngeal -- Pharyngeal- Mechanical Soft -- Pharyngeal -- Pharyngeal- Regular -- Pharyngeal -- Pharyngeal- Multi-consistency -- Pharyngeal -- Pharyngeal- Pill -- Pharyngeal --  Pharyngeal Comment pt became asphyxiated  CHL IP CERVICAL ESOPHAGEAL PHASE 08/02/2020 Cervical Esophageal Phase Impaired Pudding Teaspoon -- Pudding Cup -- Honey Teaspoon -- Honey Cup -- Nectar Teaspoon Reduced cricopharyngeal relaxation Nectar Cup Reduced cricopharyngeal relaxation Nectar Straw -- Thin Teaspoon Reduced cricopharyngeal relaxation Thin Cup Reduced cricopharyngeal relaxation Thin Straw -- Puree Reduced cricopharyngeal relaxation Mechanical Soft -- Regular -- Multi-consistency -- Pill -- Cervical Esophageal Comment -- Happi B. Rutherford Nail M.S., CCC-SLP, [place] [facility] [phone] [name] 08/30/2020, 9:39 AM               Scheduled Meds: . buPROPion  300 mg Oral Daily  . citalopram  20 mg Oral Daily  . feeding supplement  237 mL Oral BID BM  . glycopyrrolate  0.1 mg Intravenous TID  . heparin  5,000 Units Subcutaneous Q8H  . ipratropium-albuterol  3 mL Nebulization Q6H  . OLANZapine  5 mg Oral QHS  . pantoprazole (PROTONIX) IV  40 mg Intravenous Q12H  . scopolamine  1 patch Transdermal Q72H   Continuous Infusions: . dextrose 5 % and 0.9% NaCl 50 mL/hr at 08/30/20 1816  . fluconazole (DIFLUCAN) IV      Assessment/Plan:  1. Dysphagia.  Patient failed modified barium swallow test yesterday.  Appreciate palliative care consultation.  Family interested in hospice home.  Patient is a DNR. 2. Thrush on IV Diflucan 3. Suicidal ideation with depression anxiety.  Psychiatry will discontinue IVC.  Continue oral medications. 4. Paralysis of the vocal cord. 5. Elevated liver enzymes.  Continue to hold statin. 6. Hyperlipidemia unspecified.  Hold cholesterol medication. 7. History of stroke.  Holding aspirin and Crestor     Code Status:     Code Status Orders  (From admission, onward)         Start     Ordered   08/30/20 1026  Do not attempt resuscitation (DNR)  Continuous       Question Answer Comment  In the event of  cardiac or respiratory ARREST Do not call a "code blue"   In the event of cardiac or respiratory ARREST Do not perform Intubation, CPR, defibrillation or ACLS   In the event of cardiac or respiratory ARREST Use medication by any route, position, wound care, and other measures to relive pain and suffering. May use oxygen, suction and manual treatment of airway obstruction as needed for comfort.   Comments nurse may pronounce      08/30/20 1026        Code Status History    Date Active Date Inactive Code Status Order ID Comments User Context   08/29/2020 2143 08/30/2020 1026 Full Code [id number]  Ivor Costa, MD Inpatient   08/29/2020 1334 08/29/2020 2143 DNR [id number]  Ivor Costa, MD ED   08/29/2020 1153 08/29/2020 1334 Full Code [id number]  Lucrezia Starch, MD ED   03/02/2019 0209 03/03/2019 1311 Full Code [id number]  Mansy, Arvella Merles, MD ED   Advance Care  Planning Activity    Advance Directive Documentation   Flowsheet Row Most Recent Value  Type of Advance Directive Living will  Pre-existing out of facility DNR order (yellow form or pink MOST form) --  "MOST" Form in Place? --     Family Communication: Spoke with son on the phone this morning. Disposition Plan: Status is: Inpatient  Dispo: The patient is from: Home              Anticipated d/c is to: Hospice Home              Patient currently awaiting hospice evaluation.   Difficult to place patient. Hopefully not  Time spent: 27 minutes  [place]

## 2020-08-31 NOTE — Consult Note (Signed)
[facility] Consult   Reason for Consult: Follow-up consult for this 75 year old man with a long history of depression Referring Physician:  [place] Patient Identification: Keith Hall MRN:  [id number] Principal Diagnosis: Severe recurrent major depression without psychotic features ([place]) Diagnosis:  Principal Problem:   Severe recurrent major depression without psychotic features ([place]) Active Problems:   BPH (benign prostatic hyperplasia)   Abnormal LFTs   HLD (hyperlipidemia)   Stroke ([place])   GERD (gastroesophageal reflux disease)   Depression with anxiety   Suicidal ideation   Dysphagia   Thrush   Vocal cord paralysis   Elevated liver enzymes   Total Time spent with patient: 30 minutes  Subjective:   Keith Hall is a 75 y.o. male patient admitted with "not good".  HPI: Patient seen for follow-up.  His son and wife are present as well.  Patient is unable to swallow without aspiration.  He has met with providers today who have discussed the various options for treatment and is at this point refusing the option of a feeding tube.  Patient tells me that he wants to just "be comfortable and die".  He is very focused on the idea that he is a "burden to his family".  Family tells him to his face that they do not see it that way but the patient insists that he prefers it this way.  He states he has no intention of doing anything violent in addition to the current plan in order to try to kill himself.  He states an understanding that refusing a feeding tube will increase the chance of dying sooner compared to getting 1.  He is agreeable to going to inpatient facility for hospice.  Patient is not presenting as delusional or psychotic.  He is alert and oriented.  He is able to address his family and able to address the fact that he wants to be comfortable as much as possible prior to dying.  Past Psychiatric History: Patient has a long history of depression with only  partial response to multiple medicines  Risk to Self:   Risk to Others:   Prior Inpatient Therapy:   Prior Outpatient Therapy:    Past Medical History:  Past Medical History:  Diagnosis Date  . BPH (benign prostatic hypertrophy)   . Depression 1980's04-08-2002   MDD---even needed hospitalization  . 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 Kidney stone 1980 only  . Osteoarthritis of both knees   . Stroke ([place])   . Sudden cardiac death [facility]) Sep 18, 1999   no clear etiology of this    Past Surgical History:  Procedure Laterality Date  . CARDIAC CATHETERIZATION  last in 18-Sep-2007   insignificant coronary disease  . CARDIOVASCULAR STRESS TEST  07/09/11   Negative--Dr Paraschos  . HERNIA REPAIR  Sep 18, 2010   left inguinal--Dr Byrnett  . ROTATOR CUFF REPAIR  09-18-2007   both done by Dr Margaretmary Eddy  . TOTAL KNEE ARTHROPLASTY Right 2/14   Dr Marry Guan  . TOTAL KNEE ARTHROPLASTY Left 1/15   Dr [facility]   Family History:  Family History  Problem Relation Age of Onset  . Stroke Mother   . COPD Sister   . Diabetes Son        Type 1  . Heart disease Neg Hx   . Hypertension Neg Hx    Family Psychiatric  History: See previous Social History:  Social History   Substance  and Sexual Activity  Alcohol Use No     Social History   Substance and Sexual Activity  Drug Use No    Social History   Socioeconomic History  . Marital status: Married    Spouse name: Not on file  . Number of children: 1  . Years of education: Not on file  . Highest education level: Not on file  Occupational History  . Occupation: Outside Oncologist    Comment: Retired at 62  [employer] [place]  . Smoking status: Never Smoker  . Smokeless tobacco: Never Used  Substance and Sexual Activity  . Alcohol use: No  . Drug use: No  . Sexual activity: Not on file  Other Topics Concern  . Not on file  Social History Narrative   1 son   Has living will   Wife, then son,  have health care POA   Would accept resuscitation attempts but no prolonged artificial ventilation   Would not want tube feeds if cognitively unaware   Social Determinants of Health   Financial Resource Strain: Low Risk   . Difficulty of Paying Living Expenses: Not very hard  Food Insecurity: Not on file  Transportation Needs: Not on file  Physical Activity: Not on file  Stress: Not on file  Social Connections: Not on file   Additional Social History:    Allergies:   Allergies  Allergen Reactions  . Atorvastatin Other (See Comments)    Elevated LFTs  . Penicillins Hives, Swelling and Rash    Did it involve swelling of the face/tongue/throat, SOB, or low BP? Yes Did it involve sudden or severe rash/hives, skin peeling, or any reaction on the inside of your mouth or nose? Yes Did you need to seek medical attention at a hospital or doctor's office? Yes When did it last happen?15 years If all above answers are "NO", may proceed with cephalosporin use.    Labs:  Results for orders placed or performed during the hospital encounter of 08/29/20 (from the past 48 hour(s))  Glucose, capillary     Status: None   Collection Time: 08/29/20 11:56 PM  Result Value Ref Range   Glucose-Capillary 77 70 - 99 mg/dL    Comment: Glucose reference range applies only to samples taken after fasting for at least 8 hours.  Urine Drug Screen, Qualitative     Status: None   Collection Time: 08/30/20  3:53 AM  Result Value Ref Range   Tricyclic, Ur Screen NONE DETECTED NONE DETECTED   Amphetamines, Ur Screen NONE DETECTED NONE DETECTED   MDMA (Ecstasy)Ur Screen NONE DETECTED NONE DETECTED   Cocaine Metabolite,Ur [place] NONE DETECTED NONE DETECTED   Opiate, Ur Screen NONE DETECTED NONE DETECTED   Phencyclidine (PCP) Ur S NONE DETECTED NONE DETECTED   Cannabinoid 50 Ng, Ur [place] NONE DETECTED NONE DETECTED   Barbiturates, Ur Screen NONE DETECTED NONE DETECTED   Benzodiazepine, Ur Scrn NONE DETECTED  NONE DETECTED   Methadone Scn, Ur NONE DETECTED NONE DETECTED    Comment: (NOTE) Tricyclics + metabolites, urine    Cutoff 1000 ng/mL Amphetamines + metabolites, urine  Cutoff 1000 ng/mL MDMA (Ecstasy), urine              Cutoff 500 ng/mL Cocaine Metabolite, urine          Cutoff 300 ng/mL Opiate + metabolites, urine        Cutoff 300 ng/mL Phencyclidine (PCP), urine         Cutoff 25 ng/mL  Cannabinoid, urine                 Cutoff 50 ng/mL Barbiturates + metabolites, urine  Cutoff 200 ng/mL Benzodiazepine, urine              Cutoff 200 ng/mL Methadone, urine                   Cutoff 300 ng/mL  The urine drug screen provides only a preliminary, unconfirmed analytical test result and should not be used for non-medical purposes. Clinical consideration and professional judgment should be applied to any positive drug screen result due to possible interfering substances. A more specific alternate chemical method must be used in order to obtain a confirmed analytical result. Gas chromatography / mass spectrometry (GC/MS) is the preferred confirm atory method. Performed at [facility], [address] [place], [place] [address]   Glucose, capillary     Status: None   Collection Time: 08/30/20  5:56 AM  Result Value Ref Range   Glucose-Capillary 75 70 - 99 mg/dL    Comment: Glucose reference range applies only to samples taken after fasting for at least 8 hours.  Basic metabolic panel     Status: Abnormal   Collection Time: 08/30/20  6:38 AM  Result Value Ref Range   Sodium 140 135 - 145 mmol/L   Potassium 4.1 3.5 - 5.1 mmol/L   Chloride 105 98 - 111 mmol/L   CO2 27 22 - 32 mmol/L   Glucose, Bld 74 70 - 99 mg/dL    Comment: Glucose reference range applies only to samples taken after fasting for at least 8 hours.   BUN 15 8 - 23 mg/dL   Creatinine, Ser 0.66 0.61 - 1.24 mg/dL   Calcium 8.6 (L) 8.9 - 10.3 mg/dL   GFR, Estimated >60 >60 mL/min    Comment: (NOTE) Calculated  using the CKD-EPI Creatinine Equation (2021)    Anion gap 8 5 - 15    Comment: Performed at [facility], [address] [place], [place] [address]  CBC     Status: Abnormal   Collection Time: 08/30/20  6:38 AM  Result Value Ref Range   WBC 6.2 4.0 - 10.5 K/uL   RBC 4.04 (L) 4.22 - 5.81 MIL/uL   Hemoglobin 11.9 (L) 13.0 - 17.0 g/dL   HCT 36.8 (L) 39.0 - 52.0 %   MCV 91.1 80.0 - 100.0 fL   MCH 29.5 26.0 - 34.0 pg   MCHC 32.3 30.0 - 36.0 g/dL   RDW 14.4 11.5 - 15.5 %   Platelets 148 (L) 150 - 400 K/uL   nRBC 0.0 0.0 - 0.2 %    Comment: Performed at [facility], [address] [place], [place] [address]  Glucose, capillary     Status: None   Collection Time: 08/30/20  8:18 AM  Result Value Ref Range   Glucose-Capillary 81 70 - 99 mg/dL    Comment: Glucose reference range applies only to samples taken after fasting for at least 8 hours.  Glucose, capillary     Status: None   Collection Time: 08/30/20  2:03 PM  Result Value Ref Range   Glucose-Capillary 70 70 - 99 mg/dL    Comment: Glucose reference range applies only to samples taken after fasting for at least 8 hours.  Glucose, capillary     Status: None   Collection Time: 08/30/20  6:18 PM  Result Value Ref Range   Glucose-Capillary 83  70 - 99 mg/dL    Comment: Glucose reference range applies only to samples taken after fasting for at least 8 hours.  Comprehensive metabolic panel     Status: Abnormal   Collection Time: 08/31/20  4:10 AM  Result Value Ref Range   Sodium 140 135 - 145 mmol/L   Potassium 3.7 3.5 - 5.1 mmol/L   Chloride 106 98 - 111 mmol/L   CO2 25 22 - 32 mmol/L   Glucose, Bld 132 (H) 70 - 99 mg/dL    Comment: Glucose reference range applies only to samples taken after fasting for at least 8 hours.   BUN 18 8 - 23 mg/dL   Creatinine, Ser 1.91 0.61 - 1.24 mg/dL   Calcium 8.7 (L) 8.9 - 10.3 mg/dL   Total Protein 6.5 6.5 - 8.1 g/dL   Albumin 3.5 3.5 - 5.0 g/dL   AST 660 (H) 15 - 41 U/L    ALT 179 (H) 0 - 44 U/L   Alkaline Phosphatase 79 38 - 126 U/L   Total Bilirubin 1.2 0.3 - 1.2 mg/dL   GFR, Estimated >60 >04 mL/min    Comment: (NOTE) Calculated using the CKD-EPI Creatinine Equation (2021)    Anion gap 9 5 - 15    Comment: Performed at [facility], [address] [place], [place] [address]  Glucose, capillary     Status: Abnormal   Collection Time: 08/31/20  9:38 AM  Result Value Ref Range   Glucose-Capillary 114 (H) 70 - 99 mg/dL    Comment: Glucose reference range applies only to samples taken after fasting for at least 8 hours.    Current Facility-Administered Medications  Medication Dose Route Frequency Provider Last Rate Last Admin  . buPROPion (WELLBUTRIN XL) 24 hr tablet 300 mg  300 mg Oral Daily Emeril Stille T, MD   300 mg at 08/29/20 1620  . citalopram (CELEXA) tablet 20 mg  20 mg Oral Daily Bernis Schreur, Jackquline Denmark, MD   20 mg at 08/29/20 1621  . dextrose 50 % solution 50 mL  50 mL Intravenous PRN Lorretta Harp, MD      . feeding supplement (ENSURE ENLIVE / ENSURE PLUS) liquid 237 mL  237 mL Oral BID BM Wieting, Richard, MD      . fluconazole (DIFLUCAN) IVPB 100 mg  100 mg Intravenous Q24H Wieting, Richard, MD      . glycopyrrolate (ROBINUL) injection 0.1 mg  0.1 mg Intravenous TID Lorretta Harp, MD   0.1 mg at 08/31/20 1633  . ipratropium-albuterol (DUONEB) 0.5-2.5 (3) MG/3ML nebulizer solution 3 mL  3 mL Nebulization Q6H Wieting, Richard, MD   3 mL at 08/31/20 1316  . LORazepam (ATIVAN) injection 0.5 mg  0.5 mg Intramuscular Q4H PRN Tasean Mancha T, MD      . LORazepam (ATIVAN) injection 0.5-1 mg  0.5-1 mg Intravenous Q4H PRN Morton Stall, NP   1 mg at 08/31/20 1632  . morphine 2 MG/ML injection 1-2 mg  1-2 mg Intravenous Q30 min PRN Morton Stall, NP      . OLANZapine (ZYPREXA) tablet 5 mg  5 mg Oral QHS Wilbert Hayashi T, MD   5 mg at 08/30/20 2231  . ondansetron (ZOFRAN) injection 4 mg  4 mg Intravenous Q8H PRN Lorretta Harp, MD      . scopolamine  (TRANSDERM-SCOP) 1 MG/3DAYS 1.5 mg  1 patch Transdermal Q72H Morton Stall, NP        Musculoskeletal: Strength & Muscle Tone: decreased Gait & Station:  unsteady Patient leans: N/A            Psychiatric Specialty Exam:  Presentation  General Appearance: No data recorded Eye Contact:No data recorded Speech:No data recorded Speech Volume:No data recorded Handedness:No data recorded  Mood and Affect  Mood:No data recorded Affect:No data recorded  Thought Process  Thought Processes:No data recorded Descriptions of Associations:No data recorded Orientation:No data recorded Thought Content:No data recorded History of Schizophrenia/Schizoaffective disorder:No data recorded Duration of Psychotic Symptoms:No data recorded Hallucinations:No data recorded Ideas of Reference:No data recorded Suicidal Thoughts:No data recorded Homicidal Thoughts:No data recorded  Sensorium  Memory:No data recorded Judgment:No data recorded Insight:No data recorded  Executive Functions  Concentration:No data recorded Attention Span:No data recorded Recall:No data recorded Fund of Knowledge:No data recorded Language:No data recorded  Psychomotor Activity  Psychomotor Activity:No data recorded  Assets  Assets:No data recorded  Sleep  Sleep:No data recorded  Physical Exam: Physical Exam Vitals and nursing note reviewed.  Constitutional:      Appearance: Normal appearance. He is ill-appearing.  HENT:     Head: Normocephalic and atraumatic.     Mouth/Throat:     Pharynx: Oropharynx is clear.  Eyes:     Pupils: Pupils are equal, round, and reactive to light.  Cardiovascular:     Rate and Rhythm: Normal rate and regular rhythm.  Pulmonary:     Effort: Pulmonary effort is normal.     Breath sounds: Normal breath sounds.  Abdominal:     General: Abdomen is flat.     Palpations: Abdomen is soft.  Musculoskeletal:        General: Normal range of motion.  Skin:     General: Skin is warm and dry.  Neurological:     General: No focal deficit present.     Mental Status: He is alert. Mental status is at baseline.  Psychiatric:        Attention and Perception: Attention normal.        Mood and Affect: Affect is blunt.        Speech: Speech is delayed.        Behavior: Behavior is slowed.        Thought Content: Thought content is not paranoid. Thought content does not include homicidal or suicidal ideation.        Cognition and Memory: Cognition normal.        Judgment: Judgment normal.    Review of Systems  Constitutional: Negative.   HENT: Negative.        Patient is currently unable to swallow without aspiration  Eyes: Negative.   Respiratory: Negative.   Cardiovascular: Negative.   Gastrointestinal: Negative.   Musculoskeletal: Negative.   Skin: Negative.   Neurological: Negative.   Psychiatric/Behavioral: Positive for depression. Negative for hallucinations, memory loss, substance abuse and suicidal ideas. The patient is not nervous/anxious and does not have insomnia.    Blood pressure 125/61, pulse 79, temperature 98.1 F (36.7 C), temperature source Oral, resp. rate 17, height $RemoveBe'5\' 9"'[id number]$  (1.753 m), weight 81.6 kg, SpO2 94 %. Body mass index is 26.58 kg/m.  Treatment Plan Summary: Plan Patient has a long history of severe depression.  Probably still has depressive symptoms and certainly has a personality tendency to see things in a depressive light.  He is not however showing any signs of psychosis.  He understands that he is actually in a terminal situation in any case because of the high risk of aspiration even from saliva.  He understands however that refusing  a feeding tube increases his risk of dying sooner but chooses to do that based on his fervent belief that he is a burden to his family and prefers not to cause trouble for them.  Patient at this point appears capable of making that decision.  The most humane thing in this circumstance I  think would be to allow him to go to hospice for comfort measures.  There is no treatment available for major depression that does not involve oral medication for this patient.  Wife suggests that perhaps he can have I am antianxiety medicine and the patient agrees.  I am fully agreeable to that and have put in an order for half milligram of Ativan IM every 4 hours as needed for anxiety and suggesting that he be given this at his own request.  At this point I am discontinuing the IVC as this is a prerequisite to his being able to go to hospice care.  Disposition: Supportive therapy provided about ongoing stressors.  Alethia Berthold, MD 08/31/2020 6:25 PM

## 2020-09-01 DIAGNOSIS — F418 Other specified anxiety disorders: Secondary | ICD-10-CM

## 2020-09-01 DIAGNOSIS — I639 Cerebral infarction, unspecified: Secondary | ICD-10-CM

## 2020-09-01 DIAGNOSIS — R1310 Dysphagia, unspecified: Principal | ICD-10-CM

## 2020-09-01 LAB — GLUCOSE, CAPILLARY
Glucose-Capillary: 82 mg/dL (ref 70–99)
Glucose-Capillary: 88 mg/dL (ref 70–99)

## 2020-09-01 MED ORDER — LORAZEPAM 2 MG/ML IJ SOLN: 0.5000 mg | INTRAMUSCULAR | 0 refills | Status: AC | PRN

## 2020-09-01 MED ORDER — SCOPOLAMINE 1 MG/3DAYS TD PT72: 1.0000 | MEDICATED_PATCH | Freq: Once | TRANSDERMAL | Status: DC

## 2020-09-01 MED ORDER — OLANZAPINE 5 MG PO TABS: 5.0000 mg | ORAL_TABLET | Freq: Every day | ORAL | Status: AC

## 2020-09-01 MED ORDER — MORPHINE SULFATE (PF) 2 MG/ML IV SOLN
1.0000 mg | INTRAVENOUS | 0 refills | Status: AC | PRN
Start: 2020-09-01 — End: ?

## 2020-09-01 MED ORDER — SCOPOLAMINE 1 MG/3DAYS TD PT72: 1.0000 | MEDICATED_PATCH | TRANSDERMAL | 12 refills | Status: AC

## 2020-09-01 MED ORDER — GLYCOPYRROLATE 0.2 MG/ML IJ SOLN: 0.1000 mg | Freq: Three times a day (TID) | INTRAMUSCULAR | Status: AC

## 2020-09-01 MED ORDER — IPRATROPIUM-ALBUTEROL 0.5-2.5 (3) MG/3ML IN SOLN: 3.0000 mL | Freq: Four times a day (QID) | RESPIRATORY_TRACT | Status: AC

## 2020-09-01 NOTE — Discharge Summary (Signed)
[place] [facility] [place] NAME: Keith Hall    MR#:  [id number]  DATE OF BIRTH:  1945/11/10  DATE OF ADMISSION:  08/29/2020 ADMITTING PHYSICIAN: Loletha Grayer, MD  DATE OF DISCHARGE: 09/01/2020  PRIMARY CARE PHYSICIAN: Venia Carbon, MD    ADMISSION DIAGNOSIS:  Suicidal ideation [R45.851] Dysphagia [R13.10] Dysphagia, unspecified type [R13.10]  DISCHARGE DIAGNOSIS:  Principal Problem:   Severe recurrent major depression without psychotic features ([place]) Active Problems:   BPH (benign prostatic hyperplasia)   Abnormal LFTs   HLD (hyperlipidemia)   Stroke (HCC)   GERD (gastroesophageal reflux disease)   Depression with anxiety   Suicidal ideation   Dysphagia   Thrush   Vocal cord paralysis   Elevated liver enzymes   SECONDARY DIAGNOSIS:   Past Medical History:  Diagnosis Date  . BPH (benign prostatic hypertrophy)   . Depression 1980'sMarch 26, 2004   MDD---even needed hospitalization  . 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 Kidney stone 1980 only  . Osteoarthritis of both knees   . Stroke ([place])   . Sudden cardiac death [facility]) 09/10/99   no clear etiology of this    HOSPITAL COURSE:   1.  Dysphagia and failed modified barium swallow.  Patient did not want an NG tube or feeding tube.  Patient and family interested in comfort measures.  Appreciate palliative care and hospice consultations.  Patient is a DO NOT RESUSCITATE.  Patient will be transferred to the hospice home for comfort care measures.  Patient has a suction at the bedside as needed.  Diet dysphagia 1 with nectar thickened liquids for comfort measures only.  As needed morphine, glycopyrrolate, Ativan and scopolamine patch. 2.  Thrush.  Patient was given IV Diflucan here in the hospital and thrush has improved.  This will be discontinued upon going over to the hospice home. 3.  Anxiety depression with suicidal ideation.   Initially was placed under IVC in the emergency room.  This was discontinued.  Can continue oral medications for depression if able to take.  As needed IV Ativan. 4.  Paralysis of the vocal cord 5.  Elevated liver enzymes.  Continue to hold statin 6.  Hyperlipidemia unspecified.  Holding Crestor 7.  History of stroke.  Holding aspirin and Crestor  DISCHARGE CONDITIONS:   Guarded  CONSULTS OBTAINED:  Psychiatry Palliative care Hospice  DRUG ALLERGIES:   Allergies  Allergen Reactions  . Atorvastatin Other (See Comments)    Elevated LFTs  . Penicillins Hives, Swelling and Rash    Did it involve swelling of the face/tongue/throat, SOB, or low BP? Yes Did it involve sudden or severe rash/hives, skin peeling, or any reaction on the inside of your mouth or nose? Yes Did you need to seek medical attention at a hospital or doctor's office? Yes When did it last happen?15 years If all above answers are "NO", may proceed with cephalosporin use.    DISCHARGE MEDICATIONS:   Allergies as of 09/01/2020      Reactions   Atorvastatin Other (See Comments)   Elevated LFTs   Penicillins Hives, Swelling, Rash   Did it involve swelling of the face/tongue/throat, SOB, or low BP? Yes Did it involve sudden or severe rash/hives, skin peeling, or any reaction on the inside of your mouth or nose? Yes Did you need to seek medical attention at a hospital or doctor's office? Yes When did it last happen?15 years If  all above answers are "NO", may proceed with cephalosporin use.      Medication List    STOP taking these medications   acetaminophen 500 MG tablet Commonly known as: TYLENOL   aspirin 81 MG chewable tablet   glycopyrrolate 1 MG tablet Commonly known as: Robinul Replaced by: glycopyrrolate 0.2 MG/ML injection   MOVE FREE PO   multivitamin tablet   Myrbetriq 50 MG Tb24 tablet Generic drug: mirabegron ER   pantoprazole 40 MG tablet Commonly known as: PROTONIX    polyethylene glycol 17 g packet Commonly known as: MIRALAX / GLYCOLAX   rosuvastatin 5 MG tablet Commonly known as: CRESTOR     TAKE these medications   buPROPion 150 MG 12 hr tablet Commonly known as: WELLBUTRIN SR TAKE 1 TABLET (150 MG TOTAL) BY MOUTH 2 (TWO) TIMES DAILY EVERY MORNING/AFTER LUNCH   citalopram 20 MG tablet Commonly known as: CELEXA TAKE 1 TABLET (20 MG TOTAL) BY MOUTH DAILY.   glycopyrrolate 0.2 MG/ML injection Commonly known as: ROBINUL Inject 0.5 mLs (0.1 mg total) into the vein 3 (three) times daily. Replaces: glycopyrrolate 1 MG tablet   ipratropium-albuterol 0.5-2.5 (3) MG/3ML Soln Commonly known as: DUONEB Take 3 mLs by nebulization every 6 (six) hours.   LORazepam 2 MG/ML injection Commonly known as: ATIVAN Inject 0.25 mLs (0.5 mg total) into the vein every 4 (four) hours as needed for anxiety.   morphine 2 MG/ML injection Inject 0.5 mLs (1 mg total) into the vein every 30 (thirty) minutes as needed (1mg  for moderate pain or dyspnea. 2mg  for severe pain or dyspnea.).   OLANZapine 5 MG tablet Commonly known as: ZYPREXA Take 1 tablet (5 mg total) by mouth at bedtime.   ondansetron 4 MG disintegrating tablet Commonly known as: ZOFRAN-ODT Take 1 tablet (4 mg total) by mouth every 8 (eight) hours as needed for nausea or vomiting.   scopolamine 1 MG/3DAYS Commonly known as: TRANSDERM-SCOP Place 1 patch (1.5 mg total) onto the skin every 3 (three) days. Start taking on: September 03, 2020        DISCHARGE INSTRUCTIONS:   Follow-up at hospice home 1 day  If you experience worsening of your admission symptoms, develop shortness of breath, life threatening emergency, suicidal or homicidal thoughts you must seek medical attention immediately by calling 911 or calling your MD immediately  if symptoms less severe.  You Must read complete instructions/literature along with all the possible adverse reactions/side effects for all the Medicines you take  and that have been prescribed to you. Take any new Medicines after you have completely understood and accept all the possible adverse reactions/side effects.   Please note  You were cared for by a hospitalist during your hospital stay. If you have any questions about your discharge medications or the care you received while you were in the hospital after you are discharged, you can call the unit and asked to speak with the hospitalist on call if the hospitalist that took care of you is not available. Once you are discharged, your primary care physician will handle any further medical issues. Please note that NO REFILLS for any discharge medications will be authorized once you are discharged, as it is imperative that you return to your primary care physician (or establish a relationship with a primary care physician if you do not have one) for your aftercare needs so that they can reassess your need for medications and monitor your lab values.    Today   CHIEF COMPLAINT:  Chief Complaint  Patient presents with  . Suicidal    HISTORY OF PRESENT ILLNESS:  Keith Hall  is a 75 y.o. male brought in with suicidal ideation and also mentioned that he has having trouble swallowing.   VITAL SIGNS:  Blood pressure 126/76, pulse 84, temperature 98.4 F (36.9 C), temperature source Oral, resp. rate 17, height 5\' 9"  (1.753 m), weight 81.6 kg, SpO2 93 %.  I/O:    Intake/Output Summary (Last 24 hours) at 09/01/2020 0928 Last data filed at 09/01/2020 0359 Gross per 24 hour  Intake --  Output 700 ml  Net -700 ml    PHYSICAL EXAMINATION:  GENERAL:  75 y.o.-year-old patient lying in the bed with no acute distress.  EYES: Pupils equal, round, reactive to light and accommodation. No scleral icterus. HEENT: Head atraumatic, normocephalic. Oropharynx and nasopharynx clear.  NECK:  Supple, no jugular venous distention. No thyroid enlargement, no tenderness.  LUNGS: Upper airway transmitted gurgling  sounds.  Normal breath sounds bilaterally.  CARDIOVASCULAR: S1, S2 normal. No murmurs, rubs, or gallops.  ABDOMEN: Soft, non-tender.  EXTREMITIES: No pedal edema.  NEUROLOGIC: Patient able to move all of the extremities on his own PSYCHIATRIC: The patient is alert and answers all questions appropriately.  SKIN: No obvious rash, lesion, or ulcer.   DATA REVIEW:   CBC Recent Labs  Lab 08/30/20 0638  WBC 6.2  HGB 11.9*  HCT 36.8*  PLT 148*    Chemistries  Recent Labs  Lab 08/31/20 0410  NA 140  K 3.7  CL 106  CO2 25  GLUCOSE 132*  BUN 18  CREATININE 0.82  CALCIUM 8.7*  AST 196*  ALT 179*  ALKPHOS 79  BILITOT 1.2    Microbiology Results  Results for orders placed or performed during the hospital encounter of 08/29/20  Resp Panel by RT-PCR (Flu A&B, Covid) Nasopharyngeal Swab     Status: None   Collection Time: 08/29/20 12:07 PM   Specimen: Nasopharyngeal Swab; Nasopharyngeal(NP) swabs in vial transport medium  Result Value Ref Range Status   SARS Coronavirus 2 by RT PCR NEGATIVE NEGATIVE Final    Comment: (NOTE) SARS-CoV-2 target nucleic acids are NOT DETECTED.  The SARS-CoV-2 RNA is generally detectable in upper respiratory specimens during the acute phase of infection. The lowest concentration of SARS-CoV-2 viral copies this assay can detect is 138 copies/mL. A negative result does not preclude SARS-Cov-2 infection and should not be used as the sole basis for treatment or other patient management decisions. A negative result may occur with  improper specimen collection/handling, submission of specimen other than nasopharyngeal swab, presence of viral mutation(s) within the areas targeted by this assay, and inadequate number of viral copies(<138 copies/mL). A negative result must be combined with clinical observations, patient history, and epidemiological information. The expected result is Negative.  Fact Sheet for Patients:   EntrepreneurPulse.com.au  Fact Sheet for Healthcare Providers:  IncredibleEmployment.be  This test is no t yet approved or cleared by the [place] FDA and  has been authorized for detection and/or diagnosis of SARS-CoV-2 by FDA under an Emergency Use Authorization (EUA). This EUA will remain  in effect (meaning this test can be used) for the duration of the COVID-19 declaration under Section 564(b)(1) of the Act, 21 U.S.C.section 360bbb-3(b)(1), unless the authorization is terminated  or revoked sooner.       Influenza A by PCR NEGATIVE NEGATIVE Final   Influenza B by PCR NEGATIVE NEGATIVE Final    Comment: (NOTE) The Xpert  Xpress SARS-CoV-2/FLU/RSV plus assay is intended as an aid in the diagnosis of influenza from Nasopharyngeal swab specimens and should not be used as a sole basis for treatment. Nasal washings and aspirates are unacceptable for Xpert Xpress SARS-CoV-2/FLU/RSV testing.  Fact Sheet for Patients: EntrepreneurPulse.com.au  Fact Sheet for Healthcare Providers: IncredibleEmployment.be  This test is not yet approved or cleared by the [place] FDA and has been authorized for detection and/or diagnosis of SARS-CoV-2 by FDA under an Emergency Use Authorization (EUA). This EUA will remain in effect (meaning this test can be used) for the duration of the COVID-19 declaration under Section 564(b)(1) of the Act, 21 U.S.C. section 360bbb-3(b)(1), unless the authorization is terminated or revoked.  Performed at [facility], [address], [place], [place] [address]     RADIOLOGY:  [facility] Chest Port 1 View  Result Date: 08/30/2020 CLINICAL DATA:  Cough. EXAM: PORTABLE CHEST 1 VIEW COMPARISON:  Chest x-ray dated July 22, 2018 FINDINGS: Normal heart size. Normal pulmonary vascularity. No focal consolidation, pleural effusion, or pneumothorax. Chronic blunting of the right  costophrenic angle. No acute osseous abnormality. Unchanged mild elevation of the right hemidiaphragm. IMPRESSION: 1. No active disease. Electronically Signed   By: Titus Dubin M.D.   On: 08/30/2020 10:55     Management plans discussed with the patient, family and they are in agreement.  CODE STATUS:     Code Status Orders  (From admission, onward)         Start     Ordered   08/30/20 1026  Do not attempt resuscitation (DNR)  Continuous       Question Answer Comment  In the event of cardiac or respiratory ARREST Do not call a "code blue"   In the event of cardiac or respiratory ARREST Do not perform Intubation, CPR, defibrillation or ACLS   In the event of cardiac or respiratory ARREST Use medication by any route, position, wound care, and other measures to relive pain and suffering. May use oxygen, suction and manual treatment of airway obstruction as needed for comfort.   Comments nurse may pronounce      08/30/20 1026        Code Status History    Date Active Date Inactive Code Status Order ID Comments User Context   08/29/2020 2143 08/30/2020 1026 Full Code [id number]  Ivor Costa, MD Inpatient   08/29/2020 1334 08/29/2020 2143 DNR [id number]  Ivor Costa, MD ED   08/29/2020 1153 08/29/2020 1334 Full Code [id number]  Lucrezia Starch, MD ED   03/02/2019 0209 03/03/2019 1311 Full Code [id number]  Mansy, Arvella Merles, MD ED   Advance Care Planning Activity    Advance Directive Documentation   Flowsheet Row Most Recent Value  Type of Advance Directive Living will  Pre-existing out of facility DNR order (yellow form or pink MOST form) --  "MOST" Form in Place? --      TOTAL TIME TAKING CARE OF THIS PATIENT: 35 minutes.    Loletha Grayer M.D on 09/01/2020 at 9:28 AM  Between 7am to 6pm - Pager - [phone]  After 6pm go to [email or web address] - password [email or web address]  [facility]  CC: Primary care physician; Venia Carbon, MD

## 2020-09-01 NOTE — Progress Notes (Signed)
[facility] picks up patient for transfer to [facility]. Patient calm and cooperative and in no apparent distress. Discharge packet given to EMS personnel and they were given a brief report on patient for transport. -nothing follows

## 2020-09-01 NOTE — Progress Notes (Signed)
[facility]) Hospital Liaison RN note:  [facility] does have a room to offer today. Spouse, June, will be doing docusign today. Transportation can be arranged for 9 pm pickup. Hospital care team is aware. I will fax the discharge summary once it is available.   Please call with any hospice related questions or concerns.  Thank you for the opportunity to participate in this patient's care.  Zandra Abts, RN [facility] Liaison [phone]

## 2020-09-01 NOTE — Progress Notes (Signed)
Palliative: Keith Hall is lying quietly in bed.  He greets me making and mostly keeping eye contact.  He appears acutely/chronically ill and quite frail.  He is alert and oriented, able to make his needs known.  There is no family at bedside at this time.  Nursing staff assist to help reposition him in the bed.  He shares his gratitude.  We talked about his transition to hospice home later today.  No questions or concerns noted at this time.  Symptom management reviewed.  Plan: Comfort and dignity at end-of-life, residential hospice.  Transition to residential hospice 9 PM today.  15 minutes Quinn Axe, NP Palliative medicine team Team phone [phone] Greater than 50% of this time was spent counseling and coordinating care related to the above assessment and plan.

## 2020-09-01 NOTE — Progress Notes (Signed)
Report called Sharlyne Pacas, RN with Hospice. All questions addressed at time of call. Foley and IV will remain in place at d/c.

## 2020-09-01 NOTE — Care Management Important Message (Signed)
Important Message  Patient Details  Name: Keith Hall MRN: [id number] Date of Birth: Sep 25, 1945   Medicare Important Message Given:  Other (see comment)  Patient is on comfort care and out respect for the patient and family no Important Message from [facility] given.  [name] 09/01/2020, 8:02 AM

## 2020-09-01 NOTE — Progress Notes (Signed)
Pt asked about eye donation. Contacted [facility]. Spoke with [employer]. Kiran to reach [facility] in [place]. Spoke with Lauren from [facility] in [place]. Provided information requested. To include unit phone number should she need any more information from me.

## 2020-09-05 ENCOUNTER — Ambulatory Visit: Payer: PPO | Admitting: Speech Pathology

## 2020-09-06 ENCOUNTER — Encounter: Payer: PPO | Admitting: Speech Pathology

## 2020-09-11 ENCOUNTER — Telehealth: Payer: Self-pay | Admitting: Internal Medicine

## 2020-09-11 NOTE — Telephone Encounter (Signed)
Patient wife called in she is needing to stop the prescriptions from coming to the home for him due to he has passed away.  Please advise

## 2020-09-13 ENCOUNTER — Encounter: Payer: PPO | Admitting: Speech Pathology

## 2020-09-14 ENCOUNTER — Encounter: Payer: PPO | Admitting: Internal Medicine

## 2020-09-15 DEATH — deceased

## 2020-09-20 ENCOUNTER — Encounter: Payer: PPO | Admitting: Speech Pathology

## 2020-09-27 ENCOUNTER — Encounter: Payer: PPO | Admitting: Speech Pathology

## 2020-10-04 ENCOUNTER — Encounter: Payer: PPO | Admitting: Speech Pathology

## 2020-10-11 ENCOUNTER — Encounter: Payer: PPO | Admitting: Speech Pathology

## 2021-07-12 ENCOUNTER — Ambulatory Visit: Payer: Self-pay | Admitting: Urology

## 2021-07-18 IMAGING — DX DG ABD PORTABLE 2V
3 series · 3 of 3 positions shown · non-contrast
Comparison: None.

CLINICAL DATA: Constipation for 8 days.

EXAM:
PORTABLE ABDOMEN - 2 VIEW

[abdomen erect]
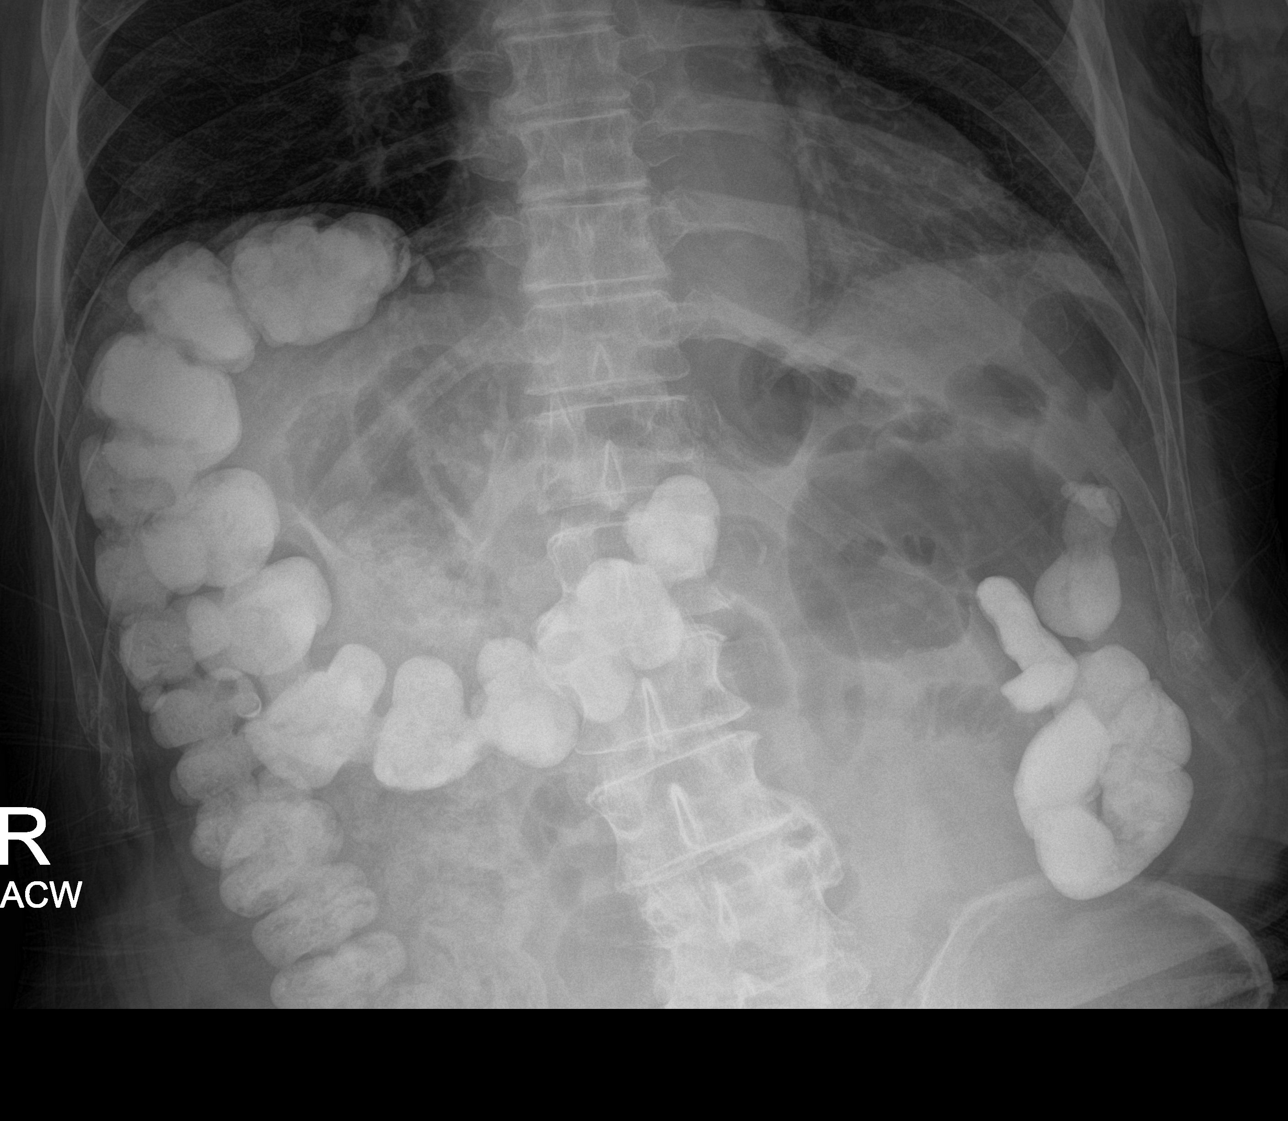

[abdomen supine (1 of 2)]
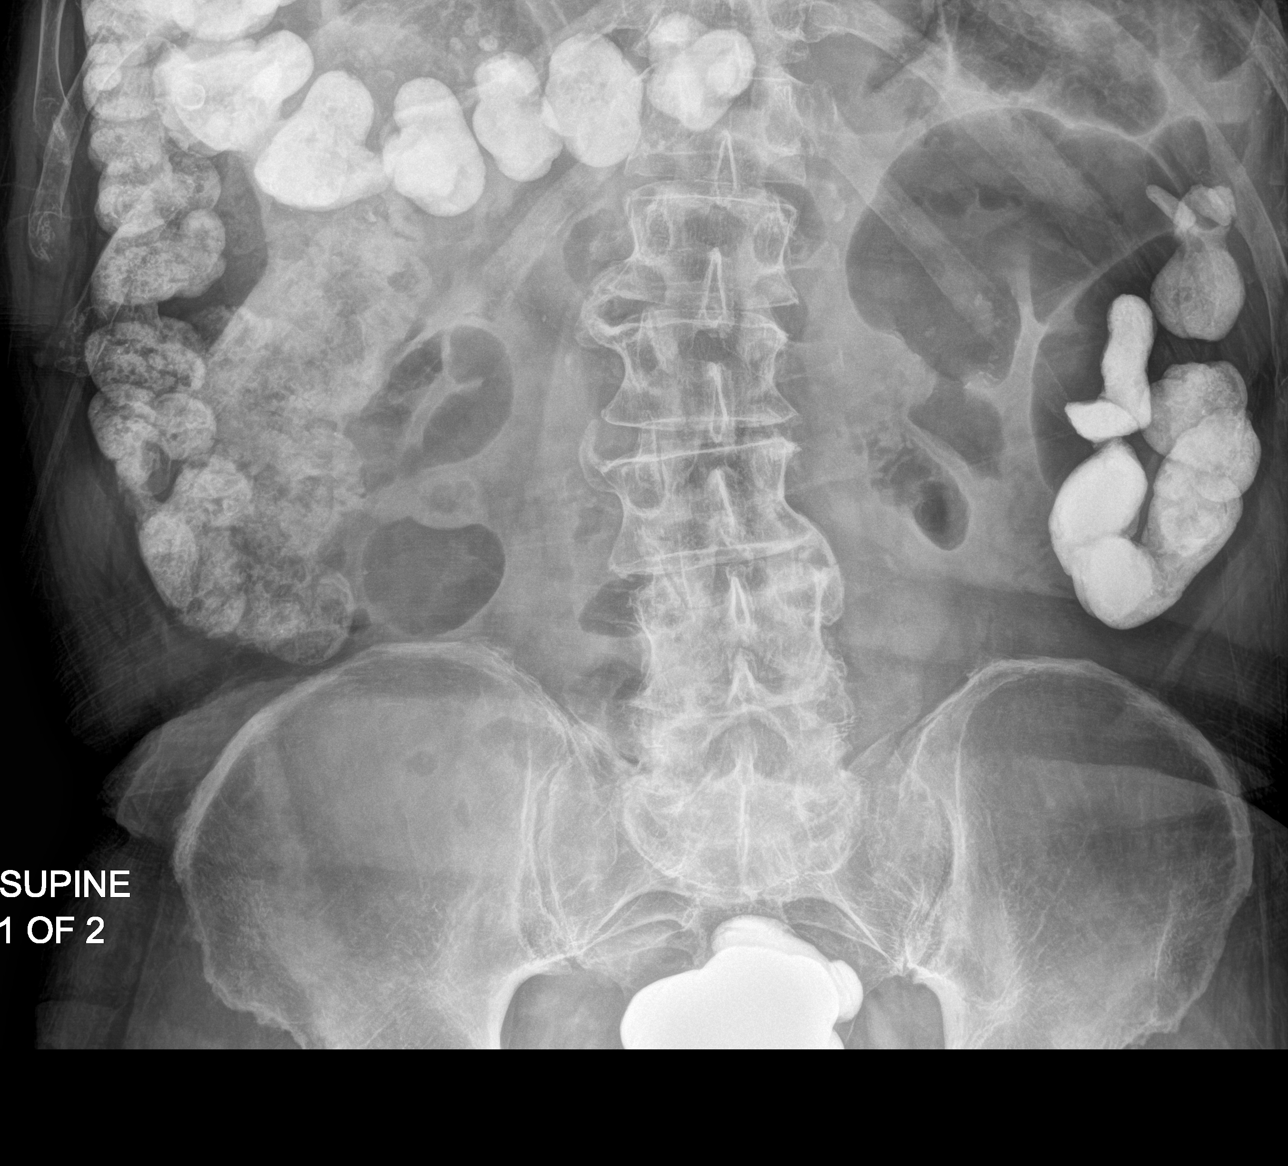

[abdomen supine (2 of 2)]
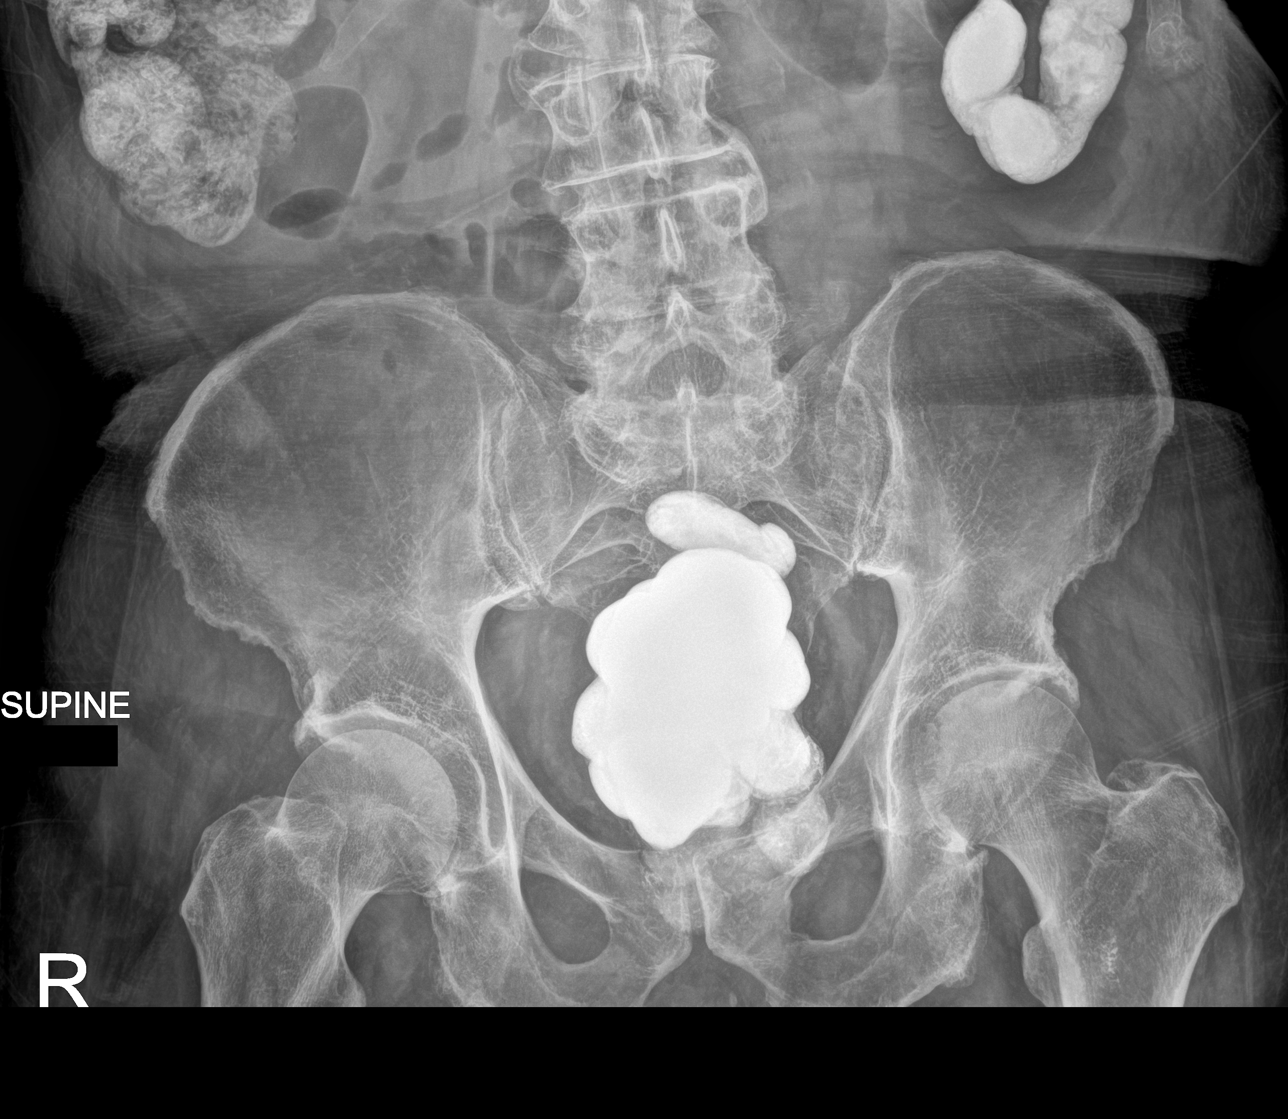

[3 of 3 positions shown; findings below may reference images not displayed]

FINDINGS: Barium is seen scattered throughout colon which is nondilated. Small
amount of colonic stool noted. No evidence of small bowel
dilatation. No evidence of free intraperitoneal air. No other
significant radiographic abnormality is seen.
IMPRESSION: Unremarkable bowel gas pattern. Residual barium noted throughout
colon, but no significant stool burden noted.
